# Patient Record
Sex: Female | Born: 1990 | Race: Black or African American | Hispanic: No | Marital: Single | Smoking: Current every day smoker
Health system: Southern US, Community
[De-identification: ages and names within clinical notes are randomized; demographics above are authoritative.]

## PROBLEM LIST (undated history)

## (undated) ENCOUNTER — Inpatient Hospital Stay (HOSPITAL_COMMUNITY): Payer: Self-pay

## (undated) DIAGNOSIS — O98519 Other viral diseases complicating pregnancy, unspecified trimester: Secondary | ICD-10-CM

## (undated) DIAGNOSIS — B009 Herpesviral infection, unspecified: Secondary | ICD-10-CM

## (undated) DIAGNOSIS — F329 Major depressive disorder, single episode, unspecified: Secondary | ICD-10-CM

## (undated) DIAGNOSIS — A749 Chlamydial infection, unspecified: Secondary | ICD-10-CM

## (undated) DIAGNOSIS — F32A Depression, unspecified: Secondary | ICD-10-CM

## (undated) DIAGNOSIS — F419 Anxiety disorder, unspecified: Secondary | ICD-10-CM

## (undated) HISTORY — DX: Other viral diseases complicating pregnancy, unspecified trimester: O98.519

## (undated) HISTORY — DX: Herpesviral infection, unspecified: B00.9

## (undated) HISTORY — PX: NO PAST SURGERIES: SHX2092

---
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
## 2009-03-05 ENCOUNTER — Inpatient Hospital Stay (HOSPITAL_COMMUNITY): Admission: AD | Admit: 2009-03-05 | Discharge: 2009-03-05 | Payer: Self-pay | Admitting: Obstetrics and Gynecology

## 2009-05-05 ENCOUNTER — Inpatient Hospital Stay (HOSPITAL_COMMUNITY): Admission: AD | Admit: 2009-05-05 | Discharge: 2009-05-06 | Payer: Self-pay | Admitting: Obstetrics and Gynecology

## 2009-05-14 ENCOUNTER — Encounter: Payer: Self-pay | Admitting: Obstetrics and Gynecology

## 2009-05-14 ENCOUNTER — Inpatient Hospital Stay (HOSPITAL_COMMUNITY): Admission: AD | Admit: 2009-05-14 | Discharge: 2009-05-28 | Payer: Self-pay | Admitting: Obstetrics and Gynecology

## 2009-05-27 ENCOUNTER — Encounter: Payer: Self-pay | Admitting: Obstetrics and Gynecology

## 2009-05-31 ENCOUNTER — Inpatient Hospital Stay (HOSPITAL_COMMUNITY): Admission: AD | Admit: 2009-05-31 | Discharge: 2009-06-12 | Payer: Self-pay | Admitting: Obstetrics and Gynecology

## 2009-06-03 ENCOUNTER — Encounter: Payer: Self-pay | Admitting: Obstetrics and Gynecology

## 2009-06-10 ENCOUNTER — Encounter (INDEPENDENT_AMBULATORY_CARE_PROVIDER_SITE_OTHER): Payer: Self-pay | Admitting: Obstetrics and Gynecology

## 2009-09-15 ENCOUNTER — Emergency Department (HOSPITAL_COMMUNITY): Admission: EM | Admit: 2009-09-15 | Discharge: 2009-09-15 | Payer: Self-pay | Admitting: Family Medicine

## 2009-11-02 ENCOUNTER — Emergency Department (HOSPITAL_COMMUNITY): Admission: EM | Admit: 2009-11-02 | Discharge: 2009-11-02 | Payer: Self-pay | Admitting: Emergency Medicine

## 2009-12-14 ENCOUNTER — Emergency Department (HOSPITAL_COMMUNITY): Admission: EM | Admit: 2009-12-14 | Discharge: 2009-12-14 | Payer: Self-pay | Admitting: Emergency Medicine

## 2009-12-25 ENCOUNTER — Inpatient Hospital Stay (HOSPITAL_COMMUNITY): Admission: AD | Admit: 2009-12-25 | Discharge: 2009-12-25 | Payer: Self-pay | Admitting: Obstetrics and Gynecology

## 2010-05-06 ENCOUNTER — Emergency Department (HOSPITAL_COMMUNITY): Admission: EM | Admit: 2010-05-06 | Discharge: 2010-05-06 | Payer: Self-pay | Admitting: Family Medicine

## 2010-05-26 ENCOUNTER — Emergency Department (HOSPITAL_COMMUNITY): Admission: EM | Admit: 2010-05-26 | Discharge: 2010-05-26 | Payer: Self-pay | Admitting: Family Medicine

## 2010-07-22 ENCOUNTER — Inpatient Hospital Stay (HOSPITAL_COMMUNITY): Admission: AD | Admit: 2010-07-22 | Discharge: 2010-07-22 | Payer: Self-pay | Admitting: Obstetrics and Gynecology

## 2010-07-22 ENCOUNTER — Ambulatory Visit: Payer: Self-pay | Admitting: Family

## 2010-07-30 ENCOUNTER — Inpatient Hospital Stay (HOSPITAL_COMMUNITY): Admission: AD | Admit: 2010-07-30 | Discharge: 2010-07-30 | Payer: Self-pay | Admitting: Obstetrics and Gynecology

## 2010-07-30 ENCOUNTER — Ambulatory Visit: Payer: Self-pay | Admitting: Obstetrics and Gynecology

## 2010-09-20 ENCOUNTER — Emergency Department (HOSPITAL_COMMUNITY): Admission: EM | Admit: 2010-09-20 | Discharge: 2010-09-20 | Payer: Self-pay | Admitting: Family Medicine

## 2010-12-26 ENCOUNTER — Encounter: Payer: Self-pay | Admitting: Obstetrics and Gynecology

## 2011-02-15 LAB — POCT URINALYSIS DIPSTICK
Bilirubin Urine: NEGATIVE
Ketones, ur: NEGATIVE mg/dL
Protein, ur: 30 mg/dL — AB
Specific Gravity, Urine: 1.02 (ref 1.005–1.030)
pH: 7 (ref 5.0–8.0)

## 2011-02-15 LAB — POCT PREGNANCY, URINE: Preg Test, Ur: NEGATIVE

## 2011-02-17 LAB — CBC
Hemoglobin: 11.4 g/dL — ABNORMAL LOW (ref 12.0–15.0)
MCH: 30.8 pg (ref 26.0–34.0)
MCHC: 34.2 g/dL (ref 30.0–36.0)
MCV: 90 fL (ref 78.0–100.0)
RBC: 3.72 MIL/uL — ABNORMAL LOW (ref 3.87–5.11)

## 2011-02-17 LAB — URINALYSIS, ROUTINE W REFLEX MICROSCOPIC
Bilirubin Urine: NEGATIVE
Bilirubin Urine: NEGATIVE
Glucose, UA: NEGATIVE mg/dL
Hgb urine dipstick: NEGATIVE
Ketones, ur: NEGATIVE mg/dL
Nitrite: NEGATIVE
Specific Gravity, Urine: 1.015 (ref 1.005–1.030)
Urobilinogen, UA: 0.2 mg/dL (ref 0.0–1.0)
pH: 6 (ref 5.0–8.0)
pH: 7 (ref 5.0–8.0)

## 2011-02-19 LAB — DIFFERENTIAL
Basophils Relative: 0 % (ref 0–1)
Lymphs Abs: 1.8 10*3/uL (ref 0.7–4.0)
Monocytes Absolute: 0.6 10*3/uL (ref 0.1–1.0)
Monocytes Relative: 10 % (ref 3–12)
Neutro Abs: 3.7 10*3/uL (ref 1.7–7.7)
Neutrophils Relative %: 60 % (ref 43–77)

## 2011-02-19 LAB — POCT URINALYSIS DIP (DEVICE)
Bilirubin Urine: NEGATIVE
Bilirubin Urine: NEGATIVE
Glucose, UA: NEGATIVE mg/dL
Glucose, UA: NEGATIVE mg/dL
Hgb urine dipstick: NEGATIVE
Hgb urine dipstick: NEGATIVE
Ketones, ur: NEGATIVE mg/dL
Nitrite: NEGATIVE
Specific Gravity, Urine: 1.015 (ref 1.005–1.030)
Specific Gravity, Urine: 1.015 (ref 1.005–1.030)
pH: 6 (ref 5.0–8.0)

## 2011-02-19 LAB — URINE CULTURE
Colony Count: 60000
Colony Count: NO GROWTH
Culture: NO GROWTH

## 2011-02-19 LAB — CBC
Hemoglobin: 10.9 g/dL — ABNORMAL LOW (ref 12.0–15.0)
MCHC: 34.1 g/dL (ref 30.0–36.0)
RBC: 3.6 MIL/uL — ABNORMAL LOW (ref 3.87–5.11)
WBC: 6.1 10*3/uL (ref 4.0–10.5)

## 2011-02-19 LAB — GC/CHLAMYDIA PROBE AMP, GENITAL
GC Probe Amp, Genital: NEGATIVE
GC Probe Amp, Genital: POSITIVE — AB

## 2011-02-19 LAB — POCT PREGNANCY, URINE: Preg Test, Ur: POSITIVE

## 2011-02-19 LAB — WET PREP, GENITAL
Trich, Wet Prep: NONE SEEN
Yeast Wet Prep HPF POC: NONE SEEN

## 2011-02-20 LAB — POCT URINALYSIS DIP (DEVICE)
Bilirubin Urine: NEGATIVE
Glucose, UA: NEGATIVE mg/dL
Hgb urine dipstick: NEGATIVE
Nitrite: NEGATIVE
Specific Gravity, Urine: >=1.03 (ref 1.005–1.030)
pH: 6 (ref 5.0–8.0)

## 2011-02-20 LAB — WET PREP, GENITAL: Yeast Wet Prep HPF POC: NONE SEEN

## 2011-02-20 LAB — GC/CHLAMYDIA PROBE AMP, GENITAL
Chlamydia, DNA Probe: POSITIVE — AB
GC Probe Amp, Genital: POSITIVE — AB

## 2011-02-24 ENCOUNTER — Inpatient Hospital Stay (INDEPENDENT_AMBULATORY_CARE_PROVIDER_SITE_OTHER)
Admission: RE | Admit: 2011-02-24 | Discharge: 2011-02-24 | Disposition: A | Discharge status: Home / Self Care | Payer: Medicaid Other | Source: Ambulatory Visit | Attending: Family Medicine | Admitting: Family Medicine

## 2011-02-24 DIAGNOSIS — N912 Amenorrhea, unspecified: Secondary | ICD-10-CM

## 2011-02-24 DIAGNOSIS — N760 Acute vaginitis: Secondary | ICD-10-CM

## 2011-02-24 LAB — POCT URINALYSIS DIP (DEVICE)
Ketones, ur: NEGATIVE mg/dL
Protein, ur: 30 mg/dL — AB
Urobilinogen, UA: 1 mg/dL (ref 0.0–1.0)
pH: 6 (ref 5.0–8.0)

## 2011-02-24 LAB — WET PREP, GENITAL

## 2011-02-26 LAB — URINE CULTURE
Colony Count: >=100000
Culture  Setup Time: 201203231055

## 2011-02-27 LAB — GC/CHLAMYDIA PROBE AMP, GENITAL: GC Probe Amp, Genital: NEGATIVE

## 2011-03-08 LAB — URINALYSIS, ROUTINE W REFLEX MICROSCOPIC
Glucose, UA: NEGATIVE mg/dL
Leukocytes, UA: NEGATIVE
Specific Gravity, Urine: 1.016 (ref 1.005–1.030)
Urobilinogen, UA: 0.2 mg/dL (ref 0.0–1.0)

## 2011-03-08 LAB — GC/CHLAMYDIA PROBE AMP, GENITAL: GC Probe Amp, Genital: NEGATIVE

## 2011-03-08 LAB — URINE MICROSCOPIC-ADD ON

## 2011-03-08 LAB — WET PREP, GENITAL

## 2011-03-09 LAB — POCT URINALYSIS DIP (DEVICE)
Bilirubin Urine: NEGATIVE
Glucose, UA: NEGATIVE mg/dL
Ketones, ur: NEGATIVE mg/dL
pH: 7 (ref 5.0–8.0)

## 2011-03-09 LAB — GC/CHLAMYDIA PROBE AMP, GENITAL
Chlamydia, DNA Probe: NEGATIVE
GC Probe Amp, Genital: NEGATIVE

## 2011-03-09 LAB — POCT PREGNANCY, URINE: Preg Test, Ur: NEGATIVE

## 2011-03-09 LAB — WET PREP, GENITAL: Yeast Wet Prep HPF POC: NONE SEEN

## 2011-03-12 LAB — CBC
HCT: 30.6 % — ABNORMAL LOW (ref 36.0–46.0)
Hemoglobin: 10.9 g/dL — ABNORMAL LOW (ref 12.0–15.0)
MCHC: 35.5 g/dL (ref 30.0–36.0)
MCHC: 35.6 g/dL (ref 30.0–36.0)
MCV: 94 fL (ref 78.0–100.0)
MCV: 94.2 fL (ref 78.0–100.0)
Platelets: 118 10*3/uL — ABNORMAL LOW (ref 150–400)
Platelets: 118 10*3/uL — ABNORMAL LOW (ref 150–400)
RDW: 12.9 % (ref 11.5–15.5)
RDW: 13.3 % (ref 11.5–15.5)

## 2011-03-13 LAB — WET PREP, GENITAL

## 2011-03-13 LAB — STREP B DNA PROBE: Strep Group B Ag: POSITIVE

## 2011-03-13 LAB — CBC
HCT: 29.6 % — ABNORMAL LOW (ref 36.0–46.0)
Hemoglobin: 11 g/dL — ABNORMAL LOW (ref 12.0–15.0)
MCHC: 34.8 g/dL (ref 30.0–36.0)
MCV: 95.6 fL (ref 78.0–100.0)
Platelets: 140 10*3/uL — ABNORMAL LOW (ref 150–400)
RBC: 3.3 MIL/uL — ABNORMAL LOW (ref 3.87–5.11)
RDW: 13.7 % (ref 11.5–15.5)
WBC: 7.8 10*3/uL (ref 4.0–10.5)
WBC: 7.5 10*3/uL (ref 4.0–10.5)

## 2011-03-13 LAB — URINALYSIS, MICROSCOPIC ONLY
Ketones, ur: NEGATIVE mg/dL
Leukocytes, UA: NEGATIVE
Protein, ur: NEGATIVE mg/dL
Urobilinogen, UA: 0.2 mg/dL (ref 0.0–1.0)

## 2011-03-13 LAB — URINE CULTURE
Colony Count: >=100000
Special Requests: NEGATIVE

## 2011-03-13 LAB — RPR: RPR Ser Ql: NONREACTIVE

## 2011-03-15 LAB — URINALYSIS, ROUTINE W REFLEX MICROSCOPIC
Glucose, UA: NEGATIVE mg/dL
Nitrite: NEGATIVE
Specific Gravity, Urine: >1.03 — ABNORMAL HIGH (ref 1.005–1.030)
pH: 6 (ref 5.0–8.0)

## 2011-03-20 LAB — COMPREHENSIVE METABOLIC PANEL
ALT: 19 U/L (ref 0–35)
Alkaline Phosphatase: 45 U/L (ref 39–117)
BUN: 8 mg/dL (ref 6–23)
CO2: 25 mEq/L (ref 19–32)
Chloride: 107 mEq/L (ref 96–112)
Glucose, Bld: 93 mg/dL (ref 70–99)
Potassium: 4 mEq/L (ref 3.5–5.1)
Sodium: 136 mEq/L (ref 135–145)
Total Bilirubin: 0.6 mg/dL (ref 0.3–1.2)
Total Protein: 6.6 g/dL (ref 6.0–8.3)

## 2011-03-20 LAB — CBC
HCT: 33.8 % — ABNORMAL LOW (ref 36.0–46.0)
Hemoglobin: 11.4 g/dL — ABNORMAL LOW (ref 12.0–15.0)
RBC: 3.73 MIL/uL — ABNORMAL LOW (ref 3.87–5.11)
RDW: 14.6 % (ref 11.5–15.5)
WBC: 9.8 10*3/uL (ref 4.0–10.5)

## 2011-03-20 LAB — DIFFERENTIAL
Basophils Relative: 0 % (ref 0–1)
Lymphocytes Relative: 6 % — ABNORMAL LOW (ref 12–46)
Lymphs Abs: 0.6 10*3/uL — ABNORMAL LOW (ref 0.7–4.0)
Monocytes Relative: 3 % (ref 3–12)
Neutro Abs: 8.9 10*3/uL — ABNORMAL HIGH (ref 1.7–7.7)
Neutrophils Relative %: 91 % — ABNORMAL HIGH (ref 43–77)

## 2011-03-20 LAB — URINALYSIS, ROUTINE W REFLEX MICROSCOPIC
Hgb urine dipstick: NEGATIVE
Nitrite: NEGATIVE
Specific Gravity, Urine: 1.02 (ref 1.005–1.030)
Urobilinogen, UA: 0.2 mg/dL (ref 0.0–1.0)
pH: 7.5 (ref 5.0–8.0)

## 2011-03-20 LAB — GC/CHLAMYDIA PROBE AMP, GENITAL
Chlamydia, DNA Probe: NEGATIVE
GC Probe Amp, Genital: NEGATIVE

## 2011-03-20 LAB — WET PREP, GENITAL

## 2011-04-17 ENCOUNTER — Inpatient Hospital Stay (HOSPITAL_COMMUNITY)
Admission: AD | Admit: 2011-04-17 | Discharge: 2011-04-17 | Disposition: A | Discharge status: Home / Self Care | Payer: Medicaid Other | Source: Ambulatory Visit | Attending: Obstetrics and Gynecology | Admitting: Obstetrics and Gynecology

## 2011-04-17 DIAGNOSIS — N898 Other specified noninflammatory disorders of vagina: Secondary | ICD-10-CM | POA: Insufficient documentation

## 2011-04-17 LAB — WET PREP, GENITAL
Clue Cells Wet Prep HPF POC: NONE SEEN
Trich, Wet Prep: NONE SEEN

## 2011-04-18 LAB — GC/CHLAMYDIA PROBE AMP, GENITAL
Chlamydia, DNA Probe: NEGATIVE
GC Probe Amp, Genital: NEGATIVE

## 2011-04-18 NOTE — Discharge Summary (Signed)
Jaime Mendoza, Jaime Mendoza               ACCOUNT NO.:  [id number]   MEDICAL RECORD NO.:  [id number]          PATIENT TYPE:  INP   LOCATION:  9302                          FACILITY:  [facility]   PHYSICIAN:  Sherron Monday, MD        DATE OF BIRTH:  06/26/1991   DATE OF ADMISSION:  05/31/2009  DATE OF DISCHARGE:  06/12/2009                               DISCHARGE SUMMARY   ADMITTING DIAGNOSIS:  Intrauterine pregnancy at 32 plus weeks with  preterm premature rupture of membranes.   DISCHARGE DIAGNOSIS:  Intrauterine pregnancy at 32 plus weeks with  preterm premature rupture of membranes, delivered via spontaneous  vaginal delivery.   HISTORY OF PRESENT ILLNESS:  A 20 year old G2, P1-0-0-1 at 36 plus weeks  with PPROM.  The patient was previously admitted with PPROM at  approximately 30 weeks, but at this time she had stopped leaking, and  amnioinfusion with dye on May 27, 2009, revealed no leakage.  The day  of admission, she had a gush of clear fluid at 10:30 a.m. with confirmed  PPROM in our office.  She states she has had somewhat decreased fetal  movement and some vaginal bleeding over the weekend that she describes  only as spotting.  She has occasional contractions.  Pregnancy has been  complicated by BV, yeast, herpes outbreak, and PPROM that she was  admitted on May 14, 2009.  She received ampicillin, Zithromax, and  betamethasone.   PAST MEDICAL HISTORY:  Significant for migraines and asthma.   PAST SURGICAL HISTORY:  Not significant.   PAST OB/GYN HISTORY:  G1 was a term vaginal delivery with female infant.  G2, present pregnancy, female infant.  No abnormal Pap smears or  sexually transmitted disease.   MEDICATIONS:  Prenatal vitamins.   ALLERGIES:  No known drug allergies.   SOCIAL HISTORY:  Denies alcohol, tobacco, or drug use.  She is single.   FAMILY HISTORY:  Significant for coronary artery disease in paternal  grandfather, maternal grandfather, hypertension is widespread  in the  family, sister with a neurologic disease, and grandmother with diabetes.   HOSPITAL COURSE:  She is A positive, antibody screen negative, sickle  cell within normal limits, RPR nonreactive, rubella immune, hepatitis B  surface antigen negative, HIV negative, gonorrhea negative, and  chlamydia negative.  Glucola of 81.  She had an ultrasound at 11 weeks  and 5 days to date the pregnancy.  She had anatomy scan at 19 weeks plus  3 days with limited heart and face anatomy, otherwise normal.  Scan  revealed normal anatomy.  In May, she had an ultrasound with good  growth, cervical length of 3.6, and normal AFI.  On admission, she had a  benign exam.  She was afebrile.  Vital signs stable.  Fetal heart tones  were in the 140s and reactive with occasional contractions.  Visually,  she was closed by speculum exam in the office.  She was admitted, had  previously received betamethasone, ampicillin, and Zithromax.  NICU was  consulted.  She received daily NSTs and plans for delivery at 34 weeks.  She also received magnesium for CP prophylaxis for 12 hours after  admission.  Her course was relatively uncomplicated.  While she was  admitted, baby remained reactive.  She continued to leak clear fluid.  Her labor was induced on May 11, 2009, this was when she was 34 weeks.  At the time of transfer cervix was fingertip, long, and -3 station.  AROM was performed.  She proceeded in her labor with complete +2, pushed  well with delivery of a viable female infant with weight of 4 pounds 7  ounces, Apgars of 9 at 1 minute and 9 at 5 minutes.  NICU was present  for her delivery.  Her postpartum course was relatively uncomplicated.  She remained afebrile with vital signs stable and was discharged home on  postpartum day #2.  At this time she was able to tolerate a diet, had  normal lochia, and her pain was well controlled.  She was afebrile.  Vital signs stable.  Her hemoglobin had decreased from 10.9  to 10.2.  She was discharged home with prescriptions for Motrin and Vicodin.  She  will continue to take prenatal vitamins for approximately a month.  She  plans to bottle feed.  She will follow up in the office in approximately  6 weeks.      Sherron Monday, MD  Electronically Signed     JB/MEDQ  D:  06/12/2009  T:  06/12/2009  Job:  [id number]

## 2011-04-18 NOTE — Discharge Summary (Signed)
NAMEBHAVYA, Jaime Mendoza               ACCOUNT NO.:  [id number]   MEDICAL RECORD NO.:  [id number]          PATIENT TYPE:  INP   LOCATION:  9149                          [facility]ILITY:  [facility]   PHYSICIAN:  Sherron Monday, MD        DATE OF BIRTH:  09/23/1991   DATE OF ADMISSION:  05/14/2009  DATE OF DISCHARGE:  05/28/2009                               DISCHARGE SUMMARY   ADMITTING DIAGNOSIS:  Preterm premature rupture of membranes,  intrauterine pregnancy at 30 weeks.   DISCHARGE DIAGNOSES:  Cessation of leakage of fluid, question reseal  confirmed by dye amnioinfusion.   PROCEDURES:  Daily NST, ultrasound, and amnioinfusion.   For complete history of physical, please see the dictated note.  However, in brief, a 20 year old G2, P1-0-0-1 at 30 weeks, admitted with  PPROM after ferning was noted in the office.  Her hospital course is  relatively uncomplicated.  She stopped leaking fluid and only was having  mucousy discharge after approximately a week of admission.  Prior to  this, she had received her Zithromax for latency as well as  betamethasone for fetal lung maturity.  She had a NICU consult.  On  hospital day #13, she again discussed the cessation of leaking fluid and  questioned if she in fact was ruptured.  Dye amnioinfusion was  performed.  No leakage was found after this.  She was discharged to home  on hospital day #14.   Routine discharge instructions and numbers to call if any questions or  problems.  She will follow up in the office for biweekly NSTs and  ultrasound in approximately a week.  She will decrease her activity as  well.  These appointments were made for her.  Prior to discharge, she  had an ultrasound that showed subjective decreased AFI.  She will have  an ultrasound again in the office that was scheduled on Thursday, July  1, for growth and evaluation of placenta and possible pericardial  effusion.  The patient voices understanding of all this and was  discharged to  home.  She was given numbers to call if any questions or  problems. Throughout her hospitalization, her baby remained  approximately in the 140s and reactive with only occasional  contractions.      Sherron Monday, MD  Electronically Signed     JB/MEDQ  D:  05/28/2009  T:  05/28/2009  Job:  [id number]

## 2011-04-18 NOTE — H&P (Signed)
Jaime Mendoza, Jaime Mendoza               ACCOUNT NO.:  [id number]   MEDICAL RECORD NO.:  [id number]          PATIENT TYPE:  OUT   LOCATION:  MFM                           [facility]   PHYSICIAN:  Malachi Pro. Ambrose Mantle, M.D. DATE OF BIRTH:  Feb 14, 1991   DATE OF ADMISSION:  05/14/2009  DATE OF DISCHARGE:                              HISTORY & PHYSICAL   PRESENT ILLNESS:  This is a 20 year old African American single female,  para 1-0-0-1, gravida 2, EDC July 22, 2009 who was admitted with  preterm premature rupture of membranes.  Blood group and type A+,  negative antibody, sickle cell negative, RPR nonreactive, rubella  immune, hepatitis B surface antigen negative, HIV negative, GC and  chlamydia negative, 1-hour Glucola 81.  Group B strep not done.  Vaginal  ultrasound on January 05, 2009, crown-rump length 4.86 cm, 11 weeks 5  days, [facility] July 22, 2009.  The patient was treated with metronidazole  and Terazol for combination bacterial vaginosis and yeast infection on  January 05, 2009.  The patient complained of an outbreak of herpes on  January 20, 2009, and was treated with Valtrex.  She requested  metronidazole again on February 18, 2009.  Ultrasound on March 01, 2009,  showed an average gestational age of [redacted] weeks 3 days, [facility] July 23, 2009.  The anatomy was normal, but there was a mild limitation of the  heart and face.  Repeat ultrasound on March 30, 2009, showed normal  facial anatomy and normal heart anatomy.  On Apr 15, 2009, her  hemoglobin was 9.7.  She was advised ferrous sulfate 325 mg once a day.  On Apr 22, 2009, the patient was seen for a bloody discharge in the  vagina.  Fetal fibronectin was negative.  Affirm was positive for  bacterial vaginosis and she was given clindamycin.  Ultrasound on Apr 22, 2009, showed a cervical length of 3.62 cm, AFI 14.57 cm, normal  fetal anatomy, a lucent strip was seen within the cervical canal that  might have represented a lucent  mucus plug.  Fetal weight was estimated  at the 65th percentile and amniotic fluid volume at the 50th percentile.  There was a nuchal cord x1.  On May 06, 2009, the patient was seen at  [facility] complaining of constantly urinating, thinking that she  had ruptured her membranes.  Tests there did not confirm rupture of  membranes.  The patient was seen again on May 14, 2009, in our office  and complained that quite a bit of fluid was running out of her vagina.  She also had some dysuria.  Urinalysis showed urine to be loaded with  white cells.  Speculum exam showed no fluid in the vagina, but the fern  test was markedly positive for ferning.  The cervix felt closed.   PAST MEDICAL HISTORY:   ALLERGIES:  NO KNOWN ALLERGIES.   She had a history of migraines, also has a history of asthma and has  been on albuterol inhalers.  She has had one outbreak of herpes.  Alcohol, tobacco and drugs  none.   FAMILY HISTORY:  Paternal grandfather and maternal grandfather with  heart disease.  Multiple family members with high blood pressure.  Sister with some type of neurologic problem.  Paternal grandmother has  diabetes.   PHYSICAL EXAMINATION:  GENERAL:  On admission, revealed a well-  developed, well-nourished black female in no distress.  VITAL SIGNS:  Blood pressure 100/70, pulse 80, weight 130 pounds.  HEAD, EYES, EARS, NOSE AND THROAT:  Show braces.  LUNGS:  Clear to auscultation.  HEART:  Normal size and sounds.  ABDOMEN:  Soft.  Fundal height is 31-1/2 cm.  Fetal heart tones are  normal.  The cervix feels closed, but it is difficult to examine.  There  is about a 3 cm urethral diverticulum or suburethral cyst in the  anterior vagina.   ADMITTING IMPRESSION:  Intrauterine pregnancy at 30 weeks and 1 day with  preterm premature rupture of membranes.  The patient is admitted for  consultation with maternal fetal medicine.  She will be given steroids.  She is given azithromycin and  ampicillin for 7 days.  She will do a  daily nonstress test.  Ultrasound will be done for growth and fluid  volume.      Malachi Pro. Ambrose Mantle, M.D.  Electronically Signed     TFH/MEDQ  D:  05/14/2009  T:  05/14/2009  Job:  [id number]

## 2011-06-02 ENCOUNTER — Inpatient Hospital Stay (HOSPITAL_COMMUNITY)
Admission: AD | Admit: 2011-06-02 | Discharge: 2011-06-02 | Disposition: A | Discharge status: Home / Self Care | Payer: Medicaid Other | Source: Ambulatory Visit | Attending: Obstetrics and Gynecology | Admitting: Obstetrics and Gynecology

## 2011-06-02 DIAGNOSIS — O210 Mild hyperemesis gravidarum: Secondary | ICD-10-CM

## 2011-06-02 DIAGNOSIS — O9989 Other specified diseases and conditions complicating pregnancy, childbirth and the puerperium: Secondary | ICD-10-CM

## 2011-06-02 DIAGNOSIS — O99891 Other specified diseases and conditions complicating pregnancy: Secondary | ICD-10-CM | POA: Insufficient documentation

## 2011-06-02 DIAGNOSIS — K5289 Other specified noninfective gastroenteritis and colitis: Secondary | ICD-10-CM | POA: Insufficient documentation

## 2011-06-02 LAB — DIFFERENTIAL
Lymphocytes Relative: 12 % (ref 12–46)
Lymphs Abs: 1 10*3/uL (ref 0.7–4.0)
Neutrophils Relative %: 81 % — ABNORMAL HIGH (ref 43–77)

## 2011-06-02 LAB — URINALYSIS, ROUTINE W REFLEX MICROSCOPIC
Bilirubin Urine: NEGATIVE
Hgb urine dipstick: NEGATIVE
Protein, ur: 30 mg/dL — AB
Urobilinogen, UA: 0.2 mg/dL (ref 0.0–1.0)

## 2011-06-02 LAB — CBC
HCT: 37.6 % (ref 36.0–46.0)
MCV: 86.2 fL (ref 78.0–100.0)
Platelets: 135 10*3/uL — ABNORMAL LOW (ref 150–400)
RBC: 4.36 MIL/uL (ref 3.87–5.11)
WBC: 7.8 10*3/uL (ref 4.0–10.5)

## 2011-06-02 LAB — URINE MICROSCOPIC-ADD ON

## 2011-06-07 ENCOUNTER — Inpatient Hospital Stay (HOSPITAL_COMMUNITY)
Admission: AD | Admit: 2011-06-07 | Discharge: 2011-06-07 | Disposition: A | Discharge status: Home / Self Care | Payer: Medicaid Other | Source: Ambulatory Visit | Attending: Obstetrics & Gynecology | Admitting: Obstetrics & Gynecology

## 2011-06-07 ENCOUNTER — Inpatient Hospital Stay (HOSPITAL_COMMUNITY): Payer: Medicaid Other

## 2011-06-07 DIAGNOSIS — O9989 Other specified diseases and conditions complicating pregnancy, childbirth and the puerperium: Secondary | ICD-10-CM

## 2011-06-07 DIAGNOSIS — N926 Irregular menstruation, unspecified: Secondary | ICD-10-CM

## 2011-06-07 DIAGNOSIS — R1032 Left lower quadrant pain: Secondary | ICD-10-CM | POA: Insufficient documentation

## 2011-06-07 DIAGNOSIS — O99891 Other specified diseases and conditions complicating pregnancy: Secondary | ICD-10-CM | POA: Insufficient documentation

## 2011-06-07 LAB — URINALYSIS, ROUTINE W REFLEX MICROSCOPIC
Glucose, UA: NEGATIVE mg/dL
Ketones, ur: NEGATIVE mg/dL
Leukocytes, UA: NEGATIVE
Specific Gravity, Urine: 1.02 (ref 1.005–1.030)
pH: 7 (ref 5.0–8.0)

## 2011-06-07 LAB — CBC
HCT: 34.4 % — ABNORMAL LOW (ref 36.0–46.0)
Hemoglobin: 11.7 g/dL — ABNORMAL LOW (ref 12.0–15.0)
MCH: 29.8 pg (ref 26.0–34.0)
MCHC: 34 g/dL (ref 30.0–36.0)

## 2011-06-16 ENCOUNTER — Encounter (HOSPITAL_COMMUNITY): Payer: Self-pay | Admitting: *Deleted

## 2011-06-16 ENCOUNTER — Other Ambulatory Visit: Payer: Self-pay | Admitting: Obstetrics & Gynecology

## 2011-06-16 ENCOUNTER — Other Ambulatory Visit (HOSPITAL_COMMUNITY): Payer: Self-pay | Admitting: Obstetrics & Gynecology

## 2011-06-16 ENCOUNTER — Encounter (HOSPITAL_COMMUNITY): Payer: Self-pay

## 2011-06-16 ENCOUNTER — Other Ambulatory Visit (HOSPITAL_COMMUNITY): Payer: Medicaid Other

## 2011-06-16 ENCOUNTER — Ambulatory Visit (HOSPITAL_COMMUNITY)
Admit: 2011-06-16 | Discharge: 2011-06-16 | Disposition: A | Discharge status: Home / Self Care | Payer: Medicaid Other | Attending: Obstetrics and Gynecology | Admitting: Obstetrics and Gynecology

## 2011-06-16 ENCOUNTER — Inpatient Hospital Stay (HOSPITAL_COMMUNITY)
Admission: AD | Admit: 2011-06-16 | Discharge: 2011-06-16 | Disposition: A | Discharge status: Home / Self Care | Payer: Medicaid Other | Source: Ambulatory Visit | Attending: Obstetrics and Gynecology | Admitting: Obstetrics and Gynecology

## 2011-06-16 DIAGNOSIS — Z363 Encounter for antenatal screening for malformations: Secondary | ICD-10-CM | POA: Insufficient documentation

## 2011-06-16 DIAGNOSIS — R52 Pain, unspecified: Secondary | ICD-10-CM

## 2011-06-16 DIAGNOSIS — Z1389 Encounter for screening for other disorder: Secondary | ICD-10-CM | POA: Insufficient documentation

## 2011-06-16 DIAGNOSIS — Z8751 Personal history of pre-term labor: Secondary | ICD-10-CM | POA: Insufficient documentation

## 2011-06-16 DIAGNOSIS — Z362 Encounter for other antenatal screening follow-up: Secondary | ICD-10-CM

## 2011-06-16 DIAGNOSIS — Z3689 Encounter for other specified antenatal screening: Secondary | ICD-10-CM | POA: Insufficient documentation

## 2011-06-16 DIAGNOSIS — O99891 Other specified diseases and conditions complicating pregnancy: Secondary | ICD-10-CM | POA: Insufficient documentation

## 2011-06-16 HISTORY — DX: Chlamydial infection, unspecified: A74.9

## 2011-06-16 MED ORDER — PROMETHAZINE HCL 25 MG RE SUPP: 25.0000 mg | Freq: Four times a day (QID) | RECTAL | Status: AC | PRN

## 2011-06-16 MED ORDER — PROMETHAZINE HCL 25 MG PO TABS: 12.5000 mg | ORAL_TABLET | Freq: Four times a day (QID) | ORAL | Status: AC | PRN

## 2011-06-16 NOTE — ED Provider Notes (Signed)
History     Chief Complaint  Patient presents with  . Follow-up   HPI  OB History    Grav Para Term Preterm Abortions TAB SAB Ect Mult Living   4 2 1 1 1  0 1 0 0 2      Past Medical History  Diagnosis Date  . Urinary tract infection   . Chlamydia   . Preterm labor   . No pertinent past medical history     Past Surgical History  Procedure Date  . No past surgeries     No family history on file.  History  Substance Use Topics  . Smoking status: Current Everyday Smoker -- 5 years    Types: Cigarettes  . Smokeless tobacco: Never Used  . Alcohol Use: No    Allergies: No Known Allergies  No prescriptions prior to admission    ROS Physical Exam   Blood pressure 98/56, pulse 81, temperature 98.6 F (37 C), temperature source Oral, resp. rate 20, last menstrual period 04/28/2011.  Physical Exam  MAU Course  Procedures Ultrasound today shows a viable 7 wk. IUP. Will d/c home to start [facility] with Dr. Ellyn Hack. Pregnancy verification letter.   [place], [place] 06/16/11 [phone]

## 2011-06-16 NOTE — Progress Notes (Signed)
Rollover from [place]- viable IUP

## 2011-07-25 LAB — HIV ANTIBODY (ROUTINE TESTING W REFLEX): HIV: NONREACTIVE

## 2011-07-25 LAB — ABO/RH: RH Type: POSITIVE

## 2011-07-25 LAB — GC/CHLAMYDIA PROBE AMP, GENITAL: Gonorrhea: NEGATIVE

## 2011-09-04 LAB — CBC
HCT: 36.5
Hemoglobin: 12.2
Platelets: 151
RDW: 12.9
WBC: 6.6

## 2011-09-04 LAB — GC/CHLAMYDIA PROBE AMP, GENITAL: Chlamydia, DNA Probe: NEGATIVE

## 2011-09-04 LAB — DIFFERENTIAL
Basophils Absolute: 0
Eosinophils Relative: 1
Lymphocytes Relative: 34
Lymphs Abs: 2.3
Neutro Abs: 3.9

## 2011-09-04 LAB — WET PREP, GENITAL: Yeast Wet Prep HPF POC: NONE SEEN

## 2011-09-04 LAB — URINE MICROSCOPIC-ADD ON

## 2011-09-04 LAB — PREGNANCY, URINE: Preg Test, Ur: NEGATIVE

## 2011-09-04 LAB — URINALYSIS, ROUTINE W REFLEX MICROSCOPIC
Bilirubin Urine: NEGATIVE
Ketones, ur: NEGATIVE
Nitrite: NEGATIVE
Protein, ur: 30 — AB
Urobilinogen, UA: 1

## 2011-09-05 LAB — WET PREP, GENITAL
Trich, Wet Prep: NONE SEEN
Yeast Wet Prep HPF POC: NONE SEEN

## 2011-09-05 LAB — URINALYSIS, ROUTINE W REFLEX MICROSCOPIC
Glucose, UA: NEGATIVE
Nitrite: NEGATIVE
Specific Gravity, Urine: 1.015
pH: 7.5

## 2011-09-05 LAB — POCT PREGNANCY, URINE: Preg Test, Ur: NEGATIVE

## 2011-09-05 LAB — GLUCOSE, CAPILLARY: Glucose-Capillary: 85

## 2011-09-08 LAB — COMPREHENSIVE METABOLIC PANEL
ALT: 12 U/L (ref 0–35)
AST: 19 U/L (ref 0–37)
CO2: 26 mEq/L (ref 19–32)
Chloride: 102 mEq/L (ref 96–112)
Creatinine, Ser: 0.58 mg/dL (ref 0.4–1.2)
GFR calc Af Amer: >60 mL/min (ref >60)
GFR calc non Af Amer: >60 mL/min (ref >60)
Glucose, Bld: 99 mg/dL (ref 70–99)
Sodium: 135 mEq/L (ref 135–145)
Total Bilirubin: 0.6 mg/dL (ref 0.3–1.2)

## 2011-09-08 LAB — WET PREP, GENITAL
Trich, Wet Prep: NONE SEEN
Yeast Wet Prep HPF POC: NONE SEEN

## 2011-09-08 LAB — POCT URINALYSIS DIP (DEVICE)
Bilirubin Urine: NEGATIVE
Glucose, UA: NEGATIVE mg/dL
Hgb urine dipstick: NEGATIVE
Specific Gravity, Urine: 1.02 (ref 1.005–1.030)
Urobilinogen, UA: 2 mg/dL — ABNORMAL HIGH (ref 0.0–1.0)

## 2011-09-08 LAB — URINALYSIS, ROUTINE W REFLEX MICROSCOPIC
Bilirubin Urine: NEGATIVE
Hgb urine dipstick: NEGATIVE
Ketones, ur: 15 mg/dL — AB
Specific Gravity, Urine: 1.025 (ref 1.005–1.030)
Urobilinogen, UA: 0.2 mg/dL (ref 0.0–1.0)

## 2011-09-08 LAB — CBC
Hemoglobin: 11.8 g/dL — ABNORMAL LOW (ref 12.0–15.0)
MCV: 89.8 fL (ref 78.0–100.0)
RBC: 3.88 MIL/uL (ref 3.87–5.11)
WBC: 6.6 10*3/uL (ref 4.0–10.5)

## 2011-09-08 LAB — POCT PREGNANCY, URINE: Preg Test, Ur: POSITIVE

## 2011-09-08 LAB — GC/CHLAMYDIA PROBE AMP, GENITAL: GC Probe Amp, Genital: NEGATIVE

## 2011-09-08 LAB — URINE MICROSCOPIC-ADD ON

## 2011-09-11 LAB — CBC
HCT: 34.4 — ABNORMAL LOW
Hemoglobin: 11.7 — ABNORMAL LOW
MCV: 89.6
RBC: 3.84
WBC: 4.9

## 2011-09-12 LAB — CBC
HCT: 31.2 — ABNORMAL LOW
MCHC: 34.8
MCHC: 34.8
MCV: 91.5
MCV: 92.1
Platelets: 137 — ABNORMAL LOW
Platelets: 142 — ABNORMAL LOW
RBC: 2.93 — ABNORMAL LOW
RDW: 13.5
WBC: 8.8

## 2011-09-14 ENCOUNTER — Inpatient Hospital Stay (HOSPITAL_COMMUNITY)
Admission: AD | Admit: 2011-09-14 | Discharge: 2011-09-14 | Disposition: A | Discharge status: Home / Self Care | Payer: Medicaid Other | Source: Ambulatory Visit | Attending: Obstetrics and Gynecology | Admitting: Obstetrics and Gynecology

## 2011-09-14 ENCOUNTER — Encounter (HOSPITAL_COMMUNITY): Payer: Self-pay | Admitting: *Deleted

## 2011-09-14 DIAGNOSIS — N390 Urinary tract infection, site not specified: Secondary | ICD-10-CM

## 2011-09-14 DIAGNOSIS — O2340 Unspecified infection of urinary tract in pregnancy, unspecified trimester: Secondary | ICD-10-CM

## 2011-09-14 DIAGNOSIS — O2390 Unspecified genitourinary tract infection in pregnancy, unspecified trimester: Secondary | ICD-10-CM | POA: Insufficient documentation

## 2011-09-14 DIAGNOSIS — R109 Unspecified abdominal pain: Secondary | ICD-10-CM | POA: Insufficient documentation

## 2011-09-14 LAB — URINALYSIS, ROUTINE W REFLEX MICROSCOPIC
Glucose, UA: NEGATIVE mg/dL
Ketones, ur: NEGATIVE mg/dL
Nitrite: POSITIVE — AB
Protein, ur: 30 mg/dL — AB

## 2011-09-14 LAB — CBC
HCT: 28 % — ABNORMAL LOW (ref 36.0–46.0)
Hemoglobin: 9.7 g/dL — ABNORMAL LOW (ref 12.0–15.0)
MCHC: 34.6 g/dL (ref 30.0–36.0)
RBC: 3.14 MIL/uL — ABNORMAL LOW (ref 3.87–5.11)
WBC: 10.4 10*3/uL (ref 4.0–10.5)

## 2011-09-14 LAB — URINE MICROSCOPIC-ADD ON

## 2011-09-14 LAB — WET PREP, GENITAL: Clue Cells Wet Prep HPF POC: NONE SEEN

## 2011-09-14 MED ORDER — SULFAMETHOXAZOLE-TRIMETHOPRIM 800-160 MG PO TABS: 1.0000 | ORAL_TABLET | Freq: Two times a day (BID) | ORAL | Status: AC

## 2011-09-14 MED ORDER — ACETAMINOPHEN 500 MG PO TABS: 1000.0000 mg | ORAL_TABLET | Freq: Once | ORAL | Status: DC

## 2011-09-14 NOTE — Progress Notes (Signed)
PT C/O LOWER ABDOMINAL AND BACK PAIN SINCE THIS AM. PT STATES AROUND 0800AM HAD A GUSH OF CLEAR LIQUID FLUID. NO PAD ON AT THIS TIME. LARGE BLUE PAD PLACED.

## 2011-09-14 NOTE — ED Provider Notes (Signed)
Jaime D Shaw20 [id number] @[redacted]w[redacted]d  Chief Complaint  Patient presents with  . Abdominal Pain  . Back Pain    SUBJECTIVE  HPI: Presents via EMS. Woke at 0600 with constant severe mid- lower right back pain. Tried Tylenol without relief. Also having mild lower abdominal discomfort. Denies dysuria, hematuria, N/V, F/C  but has had recent onset urgency and long-standing urinary frequency. Denies irritative vaginitis. At about  0800 today she experienced some fluid trickling down her leg while she was on her way to void. This has not recurred and she is not needed a peripad. Denies vaginal bleeding. Good fetal movement.  Past Medical History  Diagnosis Date  . Urinary tract infection   . Chlamydia   . Preterm labor   . No pertinent past medical history    Past Surgical History  Procedure Date  . No past surgeries    History   Social History  . Marital Status: Single    Spouse Name: N/A    Number of Children: N/A  . Years of Education: N/A   Occupational History  . Not on file.   Social History Main Topics  . Smoking status: Current Everyday Smoker -- 0.2 packs/day for 5 years    Types: Cigarettes  . Smokeless tobacco: Never Used  . Alcohol Use: No  . Drug Use: No  . Sexually Active: Yes   Other Topics Concern  . Not on file   Social History Narrative  . No narrative on file   No current facility-administered medications on file prior to encounter.   Current Outpatient Prescriptions on File Prior to Encounter  Medication Sig Dispense Refill  . Ranitidine HCl (ZANTAC PO) Take 1 tablet by mouth daily as needed.  For gas.        No Known Allergies  ROS: Pertinent items in HPI  OBJECTIVE  BP 103/66  Pulse 64  Temp(Src) 97.8 F (36.6 C) (Oral)  Resp 20  SpO2 99%  LMP 04/28/2011   Physical Exam:  General: WN/WD, appears uncomfortable [id number], NT, c/w 20 wk size Pelvic: NEFG, Spec: mod amt thin milky discharge, no vag/cx lesions, cx appears long closed.  Fern negative Back: mild-mod rt CVAT extends to rt low back. Mild left low back tenderness  DT FHR 150  Toco: No UCs  Results for orders placed during the hospital encounter of 09/14/11 (from the past 24 hour(s))  URINALYSIS, ROUTINE W REFLEX MICROSCOPIC     Status: Abnormal   Collection Time   09/14/11 10:57 AM      Component Value Range   Color, Urine YELLOW  YELLOW    Appearance CLEAR  CLEAR    Specific Gravity, Urine 1.010  1.005 - 1.030    pH 6.5  5.0 - 8.0    Glucose, UA NEGATIVE  NEGATIVE (mg/dL)   Hgb urine dipstick LARGE (*) NEGATIVE    Bilirubin Urine NEGATIVE  NEGATIVE    Ketones, ur NEGATIVE  NEGATIVE (mg/dL)   Protein, ur 30 (*) NEGATIVE (mg/dL)   Urobilinogen, UA 0.2  0.0 - 1.0 (mg/dL)   Nitrite POSITIVE (*) NEGATIVE    Leukocytes, UA MODERATE (*) NEGATIVE   URINE MICROSCOPIC-ADD ON     Status: Abnormal   Collection Time   09/14/11 10:57 AM      Component Value Range   Squamous Epithelial / LPF RARE  RARE    WBC, UA 21-50  <3 (WBC/hpf)   RBC / HPF 11-20  <3 (RBC/hpf)   Bacteria, UA MANY (*)  RARE   WET PREP, GENITAL     Status: Abnormal   Collection Time   09/14/11 12:40 PM      Component Value Range   Yeast, Wet Prep FEW (*) NONE SEEN    Trich, Wet Prep NONE SEEN  NONE SEEN    Clue Cells, Wet Prep NONE SEEN  NONE SEEN    WBC, Wet Prep HPF POC MODERATE (*) NONE SEEN   CBC     Status: Abnormal   Collection Time   09/14/11  1:30 PM      Component Value Range   WBC 10.4  4.0 - 10.5 (K/uL)   RBC 3.14 (*) 3.87 - 5.11 (MIL/uL)   Hemoglobin 9.7 (*) 12.0 - 15.0 (g/dL)   HCT 40.9 (*) 81.1 - 46.0 (%)   MCV 89.2  78.0 - 100.0 (fL)   MCH 30.9  26.0 - 34.0 (pg)   MCHC 34.6  30.0 - 36.0 (g/dL)   RDW 91.4  78.2 - 95.6 (%)   Platelets 114 (*) 150 - 400 (K/uL)    ASSESSMENT  UTI, possibly early pyelo   PLAN  C/W Dr. Senaida Ores: Initial IM dose of Rocephin then d/c home on Bactrim DS x 7 d and Tylenol for pain Pt had to leave abruptly before she got the  Tylenol and she refused the IM Rocephin. Cautioned to drink lots of fluids, start the po Bactrim today and call if any F/C, N/V, malaise.

## 2011-09-14 NOTE — Progress Notes (Signed)
Called to pt room and states she has an emergency at home and has to leave. Pt states she has called her ride and they will be with here in 15 min. Pt states she would like to get her RX before she leaves, D Poe CNM notified. Pt signed AMA form.

## 2011-09-16 LAB — URINE CULTURE
Colony Count: >=100000
Culture  Setup Time: 201210111828
Special Requests: NORMAL

## 2011-09-18 LAB — URINALYSIS, ROUTINE W REFLEX MICROSCOPIC
Bilirubin Urine: NEGATIVE
Glucose, UA: NEGATIVE
Hgb urine dipstick: NEGATIVE
Ketones, ur: NEGATIVE
Nitrite: NEGATIVE
Protein, ur: NEGATIVE
Specific Gravity, Urine: 1.01
Urobilinogen, UA: 0.2
pH: 6.5

## 2011-12-05 NOTE — L&D Delivery Note (Signed)
Delivery Note At 4:18 PM a viable female was delivered via  (Presentation: OA ; LOT  ).  APGAR: 9,9 ; weight 7#6.   Placenta status: delivered, intact .  Cord:  3VC with the following complications: none  Anesthesia: Epidural  Episiotomy: none Lacerations: none Suture Repair: N/A Est. Blood Loss (mL): 500  Mom to postpartum.  Baby to nursery-stable  BOVARD,Sanaia Jasso 01/30/2012, 4:33 PM  A+, Br/Bo, [place], [place]

## 2011-12-22 ENCOUNTER — Inpatient Hospital Stay (HOSPITAL_COMMUNITY)
Admission: AD | Admit: 2011-12-22 | Discharge: 2011-12-23 | Disposition: A | Discharge status: Home / Self Care | Payer: Medicaid Other | Source: Ambulatory Visit | Attending: Obstetrics and Gynecology | Admitting: Obstetrics and Gynecology

## 2011-12-22 ENCOUNTER — Encounter (HOSPITAL_COMMUNITY): Payer: Self-pay | Admitting: *Deleted

## 2011-12-22 DIAGNOSIS — R109 Unspecified abdominal pain: Secondary | ICD-10-CM | POA: Insufficient documentation

## 2011-12-22 DIAGNOSIS — B9689 Other specified bacterial agents as the cause of diseases classified elsewhere: Secondary | ICD-10-CM

## 2011-12-22 DIAGNOSIS — O2390 Unspecified genitourinary tract infection in pregnancy, unspecified trimester: Secondary | ICD-10-CM | POA: Insufficient documentation

## 2011-12-22 DIAGNOSIS — B3731 Acute candidiasis of vulva and vagina: Secondary | ICD-10-CM

## 2011-12-22 DIAGNOSIS — B373 Candidiasis of vulva and vagina: Secondary | ICD-10-CM

## 2011-12-22 DIAGNOSIS — A499 Bacterial infection, unspecified: Secondary | ICD-10-CM | POA: Insufficient documentation

## 2011-12-22 DIAGNOSIS — N760 Acute vaginitis: Secondary | ICD-10-CM | POA: Insufficient documentation

## 2011-12-22 DIAGNOSIS — O4700 False labor before 37 completed weeks of gestation, unspecified trimester: Secondary | ICD-10-CM

## 2011-12-22 LAB — URINALYSIS, ROUTINE W REFLEX MICROSCOPIC
Glucose, UA: NEGATIVE mg/dL
Hgb urine dipstick: NEGATIVE
Ketones, ur: NEGATIVE mg/dL
Protein, ur: NEGATIVE mg/dL
pH: 6 (ref 5.0–8.0)

## 2011-12-22 LAB — URINE MICROSCOPIC-ADD ON

## 2011-12-22 LAB — WET PREP, GENITAL: Yeast Wet Prep HPF POC: NONE SEEN

## 2011-12-22 MED ORDER — TERCONAZOLE 0.4 % VA CREA: 1.0000 | TOPICAL_CREAM | Freq: Every day | VAGINAL | Status: AC

## 2011-12-22 MED ORDER — METRONIDAZOLE 500 MG PO TABS: 500.0000 mg | ORAL_TABLET | Freq: Two times a day (BID) | ORAL | Status: DC

## 2011-12-22 NOTE — Progress Notes (Signed)
Pt states, " I've had vaginal itching  3 days and burning when I urinate for 2 days. I feel like it is a cross between a bacterial infection or UTI."

## 2011-12-22 NOTE — ED Provider Notes (Signed)
History     Chief Complaint  Patient presents with  . Abdominal Pain   HPI Jaime Mendoza 21 y.o. 34w 0d gestation  Comes to MAU with increased pressure.  Is worried about preterm labor.  History of preterm labor with a previous pregnancy.   OB History    Grav Para Term Preterm Abortions TAB SAB Ect Mult Living   4 2 1 1 1  0 1 0 0 2      Past Medical History  Diagnosis Date  . Urinary tract infection   . Chlamydia   . Preterm labor   . No pertinent past medical history   . UTI in pregnancy     Past Surgical History  Procedure Date  . No past surgeries     Family History  Problem Relation Age of Onset  . Anesthesia problems Neg Hx   . Hypotension Neg Hx   . Malignant hyperthermia Neg Hx   . Pseudochol deficiency Neg Hx     History  Substance Use Topics  . Smoking status: Current Everyday Smoker -- 0.2 packs/day for 5 years    Types: Cigarettes  . Smokeless tobacco: Never Used  . Alcohol Use: No    Allergies: No Known Allergies  Prescriptions prior to admission  Medication Sig Dispense Refill  . Prenatal Vit-Fe Fumarate-FA (PRENATAL MULTIVITAMIN) TABS Take 1 tablet by mouth daily.      . Ranitidine HCl (ZANTAC PO) Take 1 tablet by mouth daily as needed.  For gas.         Review of Systems  Gastrointestinal: Negative for abdominal pain.  Genitourinary: Negative for dysuria.       Increased pressure   Physical Exam   Blood pressure 106/66, pulse 106, temperature 98.8 F (37.1 C), temperature source Oral, resp. rate 20, height 5\' 11"  (1.803 m), weight 137 lb (62.143 kg), last menstrual period 04/28/2011.  Physical Exam  Nursing note and vitals reviewed. Constitutional: She is oriented to person, place, and time. She appears well-developed and well-nourished.  HENT:  Head: Normocephalic.  Eyes: EOM are normal.  Neck: Neck supple.  GI: Soft. There is no tenderness.       On fetal monitor - FHT baseline is 135.  Strip is reactive.  Ocassional  contraction.  Genitourinary:       Speculum exam: Vulva - liquid discharge on labia and perineum Vagina - Mod amount of frothy pale yellow discharge, no odor Cervix - No contact bleeding Bimanual exam: Cervix - int os closed, posterior, soft Uterus gravid No tenderness over bladder Adnexa non tender, no masses bilaterally  wet prep done Chaperone present for exam.  Musculoskeletal: Normal range of motion.  Neurological: She is alert and oriented to person, place, and time.  Skin: Skin is warm and dry.  Psychiatric: She has a normal mood and affect.    MAU Course  Procedures  MDM Results for orders placed during the hospital encounter of 12/22/11 (from the past 24 hour(s))  URINALYSIS, ROUTINE W REFLEX MICROSCOPIC     Status: Abnormal   Collection Time   12/22/11 10:20 PM      Component Value Range   Color, Urine YELLOW  YELLOW    APPearance CLEAR  CLEAR    Specific Gravity, Urine 1.015  1.005 - 1.030    pH 6.0  5.0 - 8.0    Glucose, UA NEGATIVE  NEGATIVE (mg/dL)   Hgb urine dipstick NEGATIVE  NEGATIVE    Bilirubin Urine NEGATIVE  NEGATIVE  Ketones, ur NEGATIVE  NEGATIVE (mg/dL)   Protein, ur NEGATIVE  NEGATIVE (mg/dL)   Urobilinogen, UA 0.2  0.0 - 1.0 (mg/dL)   Nitrite NEGATIVE  NEGATIVE    Leukocytes, UA SMALL (*) NEGATIVE   URINE MICROSCOPIC-ADD ON     Status: Abnormal   Collection Time   12/22/11 10:20 PM      Component Value Range   Squamous Epithelial / LPF MANY (*) RARE    WBC, UA 0-2  <3 (WBC/hpf)   RBC / HPF 0-2  <3 (RBC/hpf)   Bacteria, UA FEW (*) RARE    Urine-Other YEAST    WET PREP, GENITAL     Status: Abnormal   Collection Time   12/22/11 11:15 PM      Component Value Range   Yeast, Wet Prep NONE SEEN  NONE SEEN    Trich, Wet Prep NONE SEEN  NONE SEEN    Clue Cells, Wet Prep MODERATE (*) NONE SEEN    WBC, Wet Prep HPF POC MODERATE (*) NONE SEEN    Reviewed plan of care with Dr. Ambrose Mantle  Assessment and Plan  Yeast infection Bacterial  vaginosis  Plan Will prescribe terazol vaginal cream for yeast  Will prescribe metronidazole PO for BV - client not able to tolerate Flagyl - will change to clindamycin Not in labor - internal cervical os is closed Occassional contraction with reactive strip Keep your appointment in the office  Call your doctor if your symptoms worsen Do kick counts if you think they baby is not moving.   Jaime Mendoza 12/22/2011, 11:17 PM   Nolene Bernheim, NP 12/22/11 [id number]  Nolene Bernheim, NP 12/23/11 0001  Nolene Bernheim, NP 12/23/11 0010

## 2011-12-22 NOTE — Progress Notes (Signed)
G3P2 at 34.0wks. Has been having abdominal pain and pressure in buttocks since this am. No bleeding. Some cl, thick vaginal d/c this wk. States has been leaking some and has been checked in office for ruptured membranes and always negative. Induced at 33wks last pregnancy due to [facility]

## 2011-12-22 NOTE — Progress Notes (Signed)
Wet prep only obtained 

## 2011-12-22 NOTE — Progress Notes (Signed)
Lilyan Punt Np in to see pt. Spec exam done and wet prep obtained. Pt tol well.

## 2011-12-23 MED ORDER — CLINDAMYCIN HCL 150 MG PO CAPS: 300.0000 mg | ORAL_CAPSULE | Freq: Two times a day (BID) | ORAL | Status: DC

## 2011-12-23 NOTE — ED Notes (Signed)
Lilyan Punt NP in to see pt and discuss d/c plan.

## 2011-12-23 NOTE — Progress Notes (Signed)
Written and verbal d/c instructions given and understanding voiced. 

## 2012-01-03 ENCOUNTER — Encounter (HOSPITAL_COMMUNITY): Payer: Self-pay | Admitting: *Deleted

## 2012-01-03 ENCOUNTER — Inpatient Hospital Stay (HOSPITAL_COMMUNITY)
Admission: AD | Admit: 2012-01-03 | Discharge: 2012-01-03 | Disposition: A | Discharge status: Home / Self Care | Payer: Medicaid Other | Source: Ambulatory Visit | Attending: Obstetrics and Gynecology | Admitting: Obstetrics and Gynecology

## 2012-01-03 DIAGNOSIS — O4700 False labor before 37 completed weeks of gestation, unspecified trimester: Secondary | ICD-10-CM | POA: Insufficient documentation

## 2012-01-03 DIAGNOSIS — O479 False labor, unspecified: Secondary | ICD-10-CM

## 2012-01-03 NOTE — Progress Notes (Signed)
Pt complaining of SROM

## 2012-01-03 NOTE — Progress Notes (Signed)
Pt states she had mucous last night and watery fluid today

## 2012-01-03 NOTE — Progress Notes (Signed)
Pt reports back pain and pressure x 2 hours. Denies bleeding. Reports "leaking fluids for a minute" but has been checked in the office and it wasn't amniotic fluid

## 2012-01-03 NOTE — ED Provider Notes (Signed)
History     Chief Complaint  Patient presents with  . Labor Eval   HPI 21 y.o. [id number] at [redacted]w[redacted]d c/o low back pain and pressure, ? Leaking fluid tonight. No bleeding. + fetal movement.     Past Medical History  Diagnosis Date  . Urinary tract infection   . Chlamydia   . Preterm labor   . No pertinent past medical history   . UTI in pregnancy   . Asthma     Past Surgical History  Procedure Date  . No past surgeries   . Cesarean section     Family History  Problem Relation Age of Onset  . Anesthesia problems Neg Hx   . Hypotension Neg Hx   . Malignant hyperthermia Neg Hx   . Pseudochol deficiency Neg Hx     History  Substance Use Topics  . Smoking status: Current Everyday Smoker -- 0.2 packs/day for 5 years    Types: Cigarettes  . Smokeless tobacco: Never Used  . Alcohol Use: No    Allergies: No Known Allergies  Prescriptions prior to admission  Medication Sig Dispense Refill  . clindamycin (CLEOCIN) 150 MG capsule Take 2 capsules (300 mg total) by mouth 2 (two) times daily. Cancel previous metronidazole prescription.  14 capsule  0  . Prenatal Vit-Fe Fumarate-FA (PRENATAL MULTIVITAMIN) TABS Take 1 tablet by mouth daily.      . Ranitidine HCl (ZANTAC PO) Take 1 tablet by mouth daily as needed.  For gas.         Review of Systems  Constitutional: Negative.   Respiratory: Negative.   Cardiovascular: Negative.   Gastrointestinal: Negative for nausea, vomiting, abdominal pain, diarrhea and constipation.  Genitourinary: Negative for dysuria, urgency, frequency, hematuria and flank pain.       Negative for vaginal bleeding, + contractions   Musculoskeletal: Positive for back pain.  Neurological: Negative.   Psychiatric/Behavioral: Negative.    Physical Exam   Blood pressure 116/65, pulse 102, temperature 98.8 F (37.1 C), resp. rate 18, height 5\' 11"  (1.803 m), weight 140 lb (63.504 kg), last menstrual period 04/28/2011, SpO2 99.00%.  Physical Exam    Nursing note and vitals reviewed. Constitutional: She is oriented to person, place, and time. She appears well-developed and well-nourished. No distress.  Cardiovascular: Normal rate.   Respiratory: Effort normal.  GI: Soft. There is no tenderness.  Genitourinary: Vaginal discharge (white, no pooling) found.       SVE: 1/thick/-2/posterior  Musculoskeletal: Normal range of motion.  Neurological: She is alert and oriented to person, place, and time.  Skin: Skin is warm and dry.  Psychiatric: She has a normal mood and affect.   EFM: reactive, TOCO: initially q 4-5 minutes, decreased after observation  No change in cervical exam after 1 hour MAU Course  Procedures     Assessment and Plan  21 y.o. [id number] at [redacted]w[redacted]d Threatened preterm labor - no active labor Rev'd precautions  Katrinka Herbison 01/03/2012, 10:08 PM

## 2012-01-05 LAB — STREP B DNA PROBE: GBS: POSITIVE

## 2012-01-24 ENCOUNTER — Telehealth (HOSPITAL_COMMUNITY): Payer: Self-pay | Admitting: *Deleted

## 2012-01-24 ENCOUNTER — Encounter (HOSPITAL_COMMUNITY): Payer: Self-pay | Admitting: *Deleted

## 2012-01-24 NOTE — Telephone Encounter (Signed)
Preadmission screen  

## 2012-01-29 ENCOUNTER — Other Ambulatory Visit: Payer: Self-pay | Admitting: Obstetrics and Gynecology

## 2012-01-29 NOTE — H&P (Addendum)
  21yo [id number] @ 39+ for iol given term status and favorable cervix.  PNC largely uncomplicated, pt is tobacco user and has h/o HSV, on valtex for herpes supression, GBBS +.  +FM, no LOF, no VB, occ ctx  PMH migraines, ?asthma PSH none POBGynHX G1 SAB G2 SVD 6#12 G3 34 wk SVD after PPROM G4 present No abn pap HSV - on valtrex Meds albuterol, PNV, Valtrex All NKDA SH +tob, no ETOH, no drugs, single FH colon CA, CAD, DM, HTN, seizure d/o  ROS denies  AFVSS gen NAD CV RRR Lungs CTAB Abd soft, FNT Ext sym, NT  SVE 2+, Fundal height appropriate for gestation, EFW 7#  PNL A+, Ab Scr neg, Hgb 12.6, Pap WNL, RI, RPR NR, HepBsAg neg. HIV neg, Plt 180K, Hgb electro WNL, GC neg, Chl neg, CF neg, First tri scfreen WNL, glucola 86, GBBS +  [place] at 12 wk cwd; nl anat  A&P 21yo [id number] @ 39+ for iol gbbs + - prophylaxis with PCN Pitocin to induce, AROM p PCN Epidural for comfort Expect SVD

## 2012-01-30 ENCOUNTER — Inpatient Hospital Stay (HOSPITAL_COMMUNITY): Payer: Medicaid Other | Admitting: Anesthesiology

## 2012-01-30 ENCOUNTER — Encounter (HOSPITAL_COMMUNITY): Payer: Self-pay | Admitting: Anesthesiology

## 2012-01-30 ENCOUNTER — Encounter (HOSPITAL_COMMUNITY): Payer: Self-pay

## 2012-01-30 ENCOUNTER — Inpatient Hospital Stay (HOSPITAL_COMMUNITY)
Admission: RE | Admit: 2012-01-30 | Discharge: 2012-02-01 | DRG: 775 | Disposition: A | Discharge status: Home / Self Care | Payer: Medicaid Other | Source: Ambulatory Visit | Attending: Obstetrics and Gynecology | Admitting: Obstetrics and Gynecology

## 2012-01-30 DIAGNOSIS — Z3480 Encounter for supervision of other normal pregnancy, unspecified trimester: Secondary | ICD-10-CM

## 2012-01-30 LAB — CBC
Hemoglobin: 10.1 g/dL — ABNORMAL LOW (ref 12.0–15.0)
MCH: 30.7 pg (ref 26.0–34.0)
MCHC: 34 g/dL (ref 30.0–36.0)
Platelets: 154 10*3/uL (ref 150–400)

## 2012-01-30 LAB — RPR: RPR Ser Ql: NONREACTIVE

## 2012-01-30 MED ORDER — PENICILLIN G POTASSIUM 5000000 UNITS IJ SOLR
2.5000 10*6.[IU] | INTRAVENOUS | Status: DC
  Administered 2012-01-30: 2.5 10*6.[IU] via INTRAVENOUS
  Filled 2012-01-30 (×5): qty 2.5

## 2012-01-30 MED ORDER — ZOLPIDEM TARTRATE 5 MG PO TABS: 5.0000 mg | ORAL_TABLET | Freq: Every evening | ORAL | Status: DC | PRN

## 2012-01-30 MED ORDER — ACETAMINOPHEN 325 MG PO TABS: 650.0000 mg | ORAL_TABLET | ORAL | Status: DC | PRN

## 2012-01-30 MED ORDER — FLEET ENEMA 7-19 GM/118ML RE ENEM: 1.0000 | ENEMA | RECTAL | Status: DC | PRN

## 2012-01-30 MED ORDER — OXYTOCIN BOLUS FROM INFUSION
500.0000 mL | Freq: Once | INTRAVENOUS | Status: AC
  Administered 2012-01-30: 500 mL via INTRAVENOUS
  Filled 2012-01-30: qty 500

## 2012-01-30 MED ORDER — VALACYCLOVIR HCL 500 MG PO TABS
500.0000 mg | ORAL_TABLET | Freq: Every day | ORAL | Status: DC
  Filled 2012-01-30 (×2): qty 1

## 2012-01-30 MED ORDER — LANOLIN HYDROUS EX OINT: TOPICAL_OINTMENT | CUTANEOUS | Status: DC | PRN

## 2012-01-30 MED ORDER — ONDANSETRON HCL 4 MG/2ML IJ SOLN
4.0000 mg | Freq: Four times a day (QID) | INTRAMUSCULAR | Status: DC | PRN
  Administered 2012-01-30: 4 mg via INTRAVENOUS
  Filled 2012-01-30: qty 2

## 2012-01-30 MED ORDER — ONDANSETRON HCL 4 MG PO TABS: 4.0000 mg | ORAL_TABLET | ORAL | Status: DC | PRN

## 2012-01-30 MED ORDER — ALBUTEROL SULFATE HFA 108 (90 BASE) MCG/ACT IN AERS: 2.0000 | INHALATION_SPRAY | Freq: Four times a day (QID) | RESPIRATORY_TRACT | Status: DC | PRN

## 2012-01-30 MED ORDER — EPHEDRINE 5 MG/ML INJ
10.0000 mg | INTRAVENOUS | Status: DC | PRN
  Filled 2012-01-30: qty 4

## 2012-01-30 MED ORDER — PHENYLEPHRINE 40 MCG/ML (10ML) SYRINGE FOR IV PUSH (FOR BLOOD PRESSURE SUPPORT): 80.0000 ug | PREFILLED_SYRINGE | INTRAVENOUS | Status: DC | PRN

## 2012-01-30 MED ORDER — LACTATED RINGERS IV SOLN
500.0000 mL | Freq: Once | INTRAVENOUS | Status: AC
  Administered 2012-01-30: 500 mL via INTRAVENOUS

## 2012-01-30 MED ORDER — CITRIC ACID-SODIUM CITRATE 334-500 MG/5ML PO SOLN: 30.0000 mL | ORAL | Status: DC | PRN

## 2012-01-30 MED ORDER — LIDOCAINE HCL (PF) 1 % IJ SOLN
INTRAMUSCULAR | Status: DC | PRN
  Administered 2012-01-30 (×2): 4 mL
  Administered 2012-01-30: 2 mL
  Administered 2012-01-30: 4 mL

## 2012-01-30 MED ORDER — MEDROXYPROGESTERONE ACETATE 150 MG/ML IM SUSP
150.0000 mg | INTRAMUSCULAR | Status: AC | PRN
  Administered 2012-02-01: 150 mg via INTRAMUSCULAR
  Filled 2012-01-30: qty 1

## 2012-01-30 MED ORDER — LIDOCAINE HCL (PF) 1 % IJ SOLN
30.0000 mL | INTRAMUSCULAR | Status: DC | PRN
  Filled 2012-01-30: qty 30

## 2012-01-30 MED ORDER — OXYTOCIN 20 UNITS IN LACTATED RINGERS INFUSION - SIMPLE
1.0000 m[IU]/min | INTRAVENOUS | Status: DC
  Administered 2012-01-30: 2 m[IU]/min via INTRAVENOUS
  Filled 2012-01-30: qty 1000

## 2012-01-30 MED ORDER — PHENYLEPHRINE 40 MCG/ML (10ML) SYRINGE FOR IV PUSH (FOR BLOOD PRESSURE SUPPORT)
80.0000 ug | PREFILLED_SYRINGE | INTRAVENOUS | Status: DC | PRN
  Filled 2012-01-30: qty 5

## 2012-01-30 MED ORDER — DIPHENHYDRAMINE HCL 50 MG/ML IJ SOLN: 12.5000 mg | INTRAMUSCULAR | Status: DC | PRN

## 2012-01-30 MED ORDER — DIPHENHYDRAMINE HCL 25 MG PO CAPS: 25.0000 mg | ORAL_CAPSULE | Freq: Four times a day (QID) | ORAL | Status: DC | PRN

## 2012-01-30 MED ORDER — LACTATED RINGERS IV SOLN: INTRAVENOUS | Status: DC

## 2012-01-30 MED ORDER — LACTATED RINGERS IV SOLN
500.0000 mL | INTRAVENOUS | Status: DC | PRN
  Administered 2012-01-30: 1000 mL via INTRAVENOUS

## 2012-01-30 MED ORDER — WITCH HAZEL-GLYCERIN EX PADS: 1.0000 "application " | MEDICATED_PAD | CUTANEOUS | Status: DC | PRN

## 2012-01-30 MED ORDER — ONDANSETRON HCL 4 MG/2ML IJ SOLN: 4.0000 mg | INTRAMUSCULAR | Status: DC | PRN

## 2012-01-30 MED ORDER — SIMETHICONE 80 MG PO CHEW: 80.0000 mg | CHEWABLE_TABLET | ORAL | Status: DC | PRN

## 2012-01-30 MED ORDER — TETANUS-DIPHTH-ACELL PERTUSSIS 5-2.5-18.5 LF-MCG/0.5 IM SUSP: 0.5000 mL | Freq: Once | INTRAMUSCULAR | Status: DC

## 2012-01-30 MED ORDER — PRENATAL MULTIVITAMIN CH: 1.0000 | ORAL_TABLET | Freq: Every day | ORAL | Status: DC

## 2012-01-30 MED ORDER — LACTATED RINGERS IV SOLN
INTRAVENOUS | Status: DC
  Administered 2012-01-30: 125 mL/h via INTRAVENOUS
  Administered 2012-01-30: 15:00:00 via INTRAVENOUS

## 2012-01-30 MED ORDER — FENTANYL 2.5 MCG/ML BUPIVACAINE 1/10 % EPIDURAL INFUSION (WH - ANES)
14.0000 mL/h | INTRAMUSCULAR | Status: DC
  Administered 2012-01-30: 14 mL/h via EPIDURAL
  Administered 2012-01-30: 16 mL/h via EPIDURAL
  Filled 2012-01-30 (×2): qty 60

## 2012-01-30 MED ORDER — PENICILLIN G POTASSIUM 5000000 UNITS IJ SOLR
5.0000 10*6.[IU] | Freq: Once | INTRAMUSCULAR | Status: AC
  Administered 2012-01-30: 5 10*6.[IU] via INTRAVENOUS
  Filled 2012-01-30: qty 5

## 2012-01-30 MED ORDER — IBUPROFEN 600 MG PO TABS
600.0000 mg | ORAL_TABLET | Freq: Four times a day (QID) | ORAL | Status: DC
  Administered 2012-01-30 – 2012-02-01 (×5): 600 mg via ORAL
  Filled 2012-01-30 (×6): qty 1

## 2012-01-30 MED ORDER — DIBUCAINE 1 % RE OINT: 1.0000 "application " | TOPICAL_OINTMENT | RECTAL | Status: DC | PRN

## 2012-01-30 MED ORDER — OXYCODONE-ACETAMINOPHEN 5-325 MG PO TABS
1.0000 | ORAL_TABLET | ORAL | Status: DC | PRN
  Administered 2012-01-31 – 2012-02-01 (×3): 1 via ORAL
  Filled 2012-01-30 (×3): qty 1

## 2012-01-30 MED ORDER — EPHEDRINE 5 MG/ML INJ: 10.0000 mg | INTRAVENOUS | Status: DC | PRN

## 2012-01-30 MED ORDER — TERBUTALINE SULFATE 1 MG/ML IJ SOLN: 0.2500 mg | Freq: Once | INTRAMUSCULAR | Status: DC | PRN

## 2012-01-30 MED ORDER — SENNOSIDES-DOCUSATE SODIUM 8.6-50 MG PO TABS
2.0000 | ORAL_TABLET | Freq: Every day | ORAL | Status: DC
  Administered 2012-01-30 – 2012-01-31 (×2): 2 via ORAL

## 2012-01-30 MED ORDER — PRENATAL MULTIVITAMIN CH
1.0000 | ORAL_TABLET | Freq: Every day | ORAL | Status: DC
  Administered 2012-01-30 – 2012-02-01 (×3): 1 via ORAL
  Filled 2012-01-30 (×3): qty 1

## 2012-01-30 MED ORDER — BENZOCAINE-MENTHOL 20-0.5 % EX AERO: 1.0000 "application " | INHALATION_SPRAY | CUTANEOUS | Status: DC | PRN

## 2012-01-30 MED ORDER — OXYTOCIN 20 UNITS IN LACTATED RINGERS INFUSION - SIMPLE: 125.0000 mL/h | Freq: Once | INTRAVENOUS | Status: DC

## 2012-01-30 MED ORDER — OXYCODONE-ACETAMINOPHEN 5-325 MG PO TABS: 1.0000 | ORAL_TABLET | ORAL | Status: DC | PRN

## 2012-01-30 MED ORDER — IBUPROFEN 600 MG PO TABS: 600.0000 mg | ORAL_TABLET | Freq: Four times a day (QID) | ORAL | Status: DC | PRN

## 2012-01-30 NOTE — Anesthesia Procedure Notes (Signed)
Epidural Patient location during procedure: OB Start time: 01/30/2012 1:14 PM Reason for block: procedure for pain  Staffing Performed by: anesthesiologist   Preanesthetic Checklist Completed: patient identified, site marked, surgical consent, pre-op evaluation, timeout performed, IV checked, risks and benefits discussed and monitors and equipment checked  Epidural Patient position: sitting Prep: site prepped and draped and DuraPrep Patient monitoring: continuous pulse ox and blood pressure Approach: midline Injection technique: LOR air  Needle:  Needle type: Tuohy  Needle gauge: 17 G Needle length: 9 cm Needle insertion depth: 4 cm Catheter type: closed end flexible Catheter size: 19 Gauge Catheter at skin depth: 9 cm Test dose: negative  Assessment Events: blood not aspirated, injection not painful, no injection resistance, negative IV test and no paresthesia  Additional Notes Discussed risk of headache, infection, bleeding, nerve injury and failed or incomplete block.  Patient voices understanding and wishes to proceed.

## 2012-01-30 NOTE — Progress Notes (Signed)
Jaime Mendoza is a 21 y.o. [id number] at [redacted]w[redacted]d admitted for induction of labor due to Elective at term.  Subjective: No c/o's  Objective: BP 113/75  Pulse 84  Temp(Src) 98 F (36.7 C) (Oral)  Resp 17  Ht 5\' 11"  (1.803 m)  Wt 65.772 kg (145 lb)  BMI 20.22 kg/m2  LMP 04/28/2011      FHT:  FHR: 130 bpm, variability: moderate,  accelerations:  Present,  decelerations:  Absent UC:   regular, every 4 minutes SVE:    2.3/50/-1 R Simpson RN  Labs: Lab Results  Component Value Date   WBC 7.7 01/30/2012   HGB 10.1* 01/30/2012   HCT 29.7* 01/30/2012   MCV 90.3 01/30/2012   PLT 154 01/30/2012    Assessment / Plan: Induction of labor due to term with favorable cervix,  progressing well on pitocin  Labor: Progressing normally, will start pitocin and AROM after PCN Preeclampsia:  no signs or symptoms of toxicity Fetal Wellbeing:  Category I Pain Control:  Epidural and and IV meds prn I/D:  n/a Anticipated MOD:  NSVD  BOVARD,Dajane Valli 01/30/2012, 8:54 AM

## 2012-01-30 NOTE — Progress Notes (Signed)
Jaime Mendoza is a 21 y.o. [id number] at [redacted]w[redacted]d admitted for induction of labor due to Elective at term.  Subjective: Some ctx, o/w no c/o's  Objective: BP 133/70  Pulse 78  Temp(Src) 98 F (36.7 C) (Oral)  Resp 13  Ht 5\' 11"  (1.803 m)  Wt 65.772 kg (145 lb)  BMI 20.22 kg/m2  LMP 04/28/2011      FHT:  FHR: 130 bpm, variability: moderate,  accelerations:  Present,  decelerations:  Absent UC:   regular, every 2-4 minutes SVE:   Dilation: 4.5 Effacement (%): 50 Station: -1 Exam by::J BOVARD, MD Pitocin at 28mU/min AROM for clear fluid, w/o diff/comp  Labs: Lab Results  Component Value Date   WBC 7.7 01/30/2012   HGB 10.1* 01/30/2012   HCT 29.7* 01/30/2012   MCV 90.3 01/30/2012   PLT 154 01/30/2012    Assessment / Plan: Induction of labor due to term with favorable cervix,  progressing well on pitocin  Labor: Progressing normally Preeclampsia:  no signs or symptoms of toxicity Fetal Wellbeing:  Category I Pain Control:  Epidural and IV pain meds prn I/D:  n/a Anticipated MOD:  NSVD  BOVARD,Jaime Mendoza 01/30/2012, 12:59 PM

## 2012-01-30 NOTE — Anesthesia Preprocedure Evaluation (Signed)
Anesthesia Evaluation  Patient identified by MRN, date of birth, ID band Patient awake    Reviewed: Allergy & Precautions, H&P , NPO status , Patient's Chart, lab work & pertinent test results, reviewed documented beta blocker date and time   History of Anesthesia Complications Negative for: history of anesthetic complications  Airway Mallampati: III TM Distance: >3 FB Neck ROM: full    Dental  (+) Teeth Intact   Pulmonary asthma (last used inhaler yesterday morning, usually uses once a week) , Current Smoker (1/4 ppd),  clear to auscultation        Cardiovascular neg cardio ROS regular Normal    Neuro/Psych Negative Neurological ROS  Negative Psych ROS   GI/Hepatic negative GI ROS, Neg liver ROS,   Endo/Other  Negative Endocrine ROS  Renal/GU negative Renal ROS  Genitourinary negative   Musculoskeletal   Abdominal   Peds  Hematology negative hematology ROS (+)   Anesthesia Other Findings   Reproductive/Obstetrics (+) Pregnancy                           Anesthesia Physical Anesthesia Plan  ASA: II  Anesthesia Plan: Epidural   Post-op Pain Management:    Induction:   Airway Management Planned:   Additional Equipment:   Intra-op Plan:   Post-operative Plan:   Informed Consent: I have reviewed the patients History and Physical, chart, labs and discussed the procedure including the risks, benefits and alternatives for the proposed anesthesia with the patient or authorized representative who has indicated his/her understanding and acceptance.     Plan Discussed with:   Anesthesia Plan Comments:         Anesthesia Quick Evaluation

## 2012-01-31 ENCOUNTER — Encounter (HOSPITAL_COMMUNITY): Payer: Self-pay

## 2012-01-31 LAB — CBC
HCT: 24.1 % — ABNORMAL LOW (ref 36.0–46.0)
Hemoglobin: 8.2 g/dL — ABNORMAL LOW (ref 12.0–15.0)
MCH: 30.7 pg (ref 26.0–34.0)
MCV: 90.3 fL (ref 78.0–100.0)
RBC: 2.67 MIL/uL — ABNORMAL LOW (ref 3.87–5.11)

## 2012-01-31 NOTE — Anesthesia Postprocedure Evaluation (Signed)
  Anesthesia Post-op Note  Patient: Jaime Mendoza  Procedure(s) Performed: * No procedures listed *  Patient Location: Mother/Baby  Anesthesia Type: Epidural  Level of Consciousness: alert  and oriented  Airway and Oxygen Therapy: Patient Spontanous Breathing  Post-op Pain: mild  Post-op Assessment: Patient's Cardiovascular Status Stable and Respiratory Function Stable  Post-op Vital Signs: stable  Complications: No apparent anesthesia complications

## 2012-01-31 NOTE — Progress Notes (Signed)
UR chart review completed.  

## 2012-01-31 NOTE — Progress Notes (Signed)
Post Partum Day 1 Subjective: no complaints, voiding and tolerating PO nl lochia, pain controlled  Objective: Blood pressure 106/65, pulse 54, temperature 96.5 F (35.8 C), temperature source Oral, resp. rate 18, height 5\' 11"  (1.803 m), weight 65.772 kg (145 lb), last menstrual period 04/28/2011, SpO2 99.00%, unknown if currently breastfeeding.  Physical Exam:  General: alert and no distress Lochia: appropriate Uterine Fundus: firm   Basename 01/31/12 0547 01/30/12 0815  HGB 8.2* 10.1*  HCT 24.1* 29.7*    Assessment/Plan: Plan for discharge tomorrow and Contraception Depo prior to discharge   LOS: 1 day   BOVARD,Matas Burrows 01/31/2012, 8:44 AM

## 2012-02-01 MED ORDER — IBUPROFEN 800 MG PO TABS: 800.0000 mg | ORAL_TABLET | Freq: Three times a day (TID) | ORAL | Status: AC | PRN

## 2012-02-01 MED ORDER — OXYCODONE-ACETAMINOPHEN 5-325 MG PO TABS: 1.0000 | ORAL_TABLET | Freq: Four times a day (QID) | ORAL | Status: AC | PRN

## 2012-02-01 MED ORDER — PRENATAL MULTIVITAMIN CH: 1.0000 | ORAL_TABLET | Freq: Every day | ORAL | Status: DC

## 2012-02-01 NOTE — Progress Notes (Signed)
Post Partum Day 2 Subjective: no complaints, voiding, tolerating PO and nl lochia, pain controlled  Objective: Blood pressure 112/73, pulse 52, temperature 98.4 F (36.9 C), temperature source Oral, resp. rate 18, height 5\' 11"  (1.803 m), weight 65.772 kg (145 lb), last menstrual period 04/28/2011, SpO2 99.00%, unknown if currently breastfeeding.  Physical Exam:  General: alert and no distress Lochia: appropriate Uterine Fundus: firm   Basename 01/31/12 0547 01/30/12 0815  HGB 8.2* 10.1*  HCT 24.1* 29.7*    Assessment/Plan: Plan for discharge tomorrow, Lactation consult and Contraception Depo d/c home with Motrin/Percocet/ PNV; f/u 6 weeks   LOS: 2 days   BOVARD,Makeyla Govan 02/01/2012, 9:04 AM

## 2012-02-01 NOTE — Discharge Summary (Signed)
Obstetric Discharge Summary Reason for Admission: induction of labor Prenatal Procedures: none Intrapartum Procedures: spontaneous vaginal delivery Postpartum Procedures: none Complications-Operative and Postpartum: none Hemoglobin  Date Value Range Status  01/31/2012 8.2* 12.0-15.0 (g/dL) Final     DELTA CHECK NOTED     REPEATED TO VERIFY     HCT  Date Value Range Status  01/31/2012 24.1* 36.0-46.0 (%) Final    Discharge Diagnoses: Term Pregnancy-delivered  Discharge Information: Date: 02/01/2012 Activity: pelvic rest Diet: routine Medications: PNV, Ibuprofen and Percocet Condition: stable Instructions: refer to practice specific booklet Discharge to: home Follow-up Information    Follow up with BOVARD,Hideo Googe, MD. Schedule an appointment as soon as possible for a visit in 6 weeks.   Contact information:   [address] [facility] [address] [place] [address] [phone]          Newborn Data: Live born female  Birth Weight: 7 lb 6 oz (3345 g) APGAR: 9, 9  Home with mother.  BOVARD,Coreyon Nicotra 02/01/2012, 9:13 AM

## 2012-12-04 NOTE — L&D Delivery Note (Signed)
Delivery Note At 7:49 PM a viable female was delivered via  (Presentation: ;  ).  APGAR: , ; weight .   Placenta status: , .  Cord:  with the following complications: .  Cord pH: not done  Anesthesia:   Episiotomy:  Lacerations:  Suture Repair: 2.0 Est. Blood Loss (mL):   Mom to postpartum.  Baby to nursery-stable.  MARSHALL,BERNARD A 06/28/2013, 7:55 PM

## 2012-12-13 ENCOUNTER — Encounter (HOSPITAL_COMMUNITY): Payer: Self-pay | Admitting: Family

## 2012-12-13 ENCOUNTER — Inpatient Hospital Stay (HOSPITAL_COMMUNITY): Payer: Medicaid Other

## 2012-12-13 ENCOUNTER — Inpatient Hospital Stay (HOSPITAL_COMMUNITY)
Admission: AD | Admit: 2012-12-13 | Discharge: 2012-12-13 | Disposition: A | Discharge status: Home / Self Care | Payer: Medicaid Other | Source: Ambulatory Visit | Attending: Obstetrics and Gynecology | Admitting: Obstetrics and Gynecology

## 2012-12-13 DIAGNOSIS — R109 Unspecified abdominal pain: Secondary | ICD-10-CM | POA: Insufficient documentation

## 2012-12-13 DIAGNOSIS — A5901 Trichomonal vulvovaginitis: Secondary | ICD-10-CM | POA: Insufficient documentation

## 2012-12-13 DIAGNOSIS — R350 Frequency of micturition: Secondary | ICD-10-CM | POA: Insufficient documentation

## 2012-12-13 DIAGNOSIS — O98819 Other maternal infectious and parasitic diseases complicating pregnancy, unspecified trimester: Secondary | ICD-10-CM | POA: Insufficient documentation

## 2012-12-13 DIAGNOSIS — O2390 Unspecified genitourinary tract infection in pregnancy, unspecified trimester: Secondary | ICD-10-CM

## 2012-12-13 LAB — WET PREP, GENITAL: Yeast Wet Prep HPF POC: NONE SEEN

## 2012-12-13 LAB — URINALYSIS, ROUTINE W REFLEX MICROSCOPIC
Glucose, UA: NEGATIVE mg/dL
Leukocytes, UA: NEGATIVE
Nitrite: NEGATIVE
Specific Gravity, Urine: >1.03 — ABNORMAL HIGH (ref 1.005–1.030)
pH: 6 (ref 5.0–8.0)

## 2012-12-13 LAB — POCT PREGNANCY, URINE: Preg Test, Ur: POSITIVE — AB

## 2012-12-13 MED ORDER — METRONIDAZOLE 500 MG PO TABS: 500.0000 mg | ORAL_TABLET | Freq: Two times a day (BID) | ORAL | Status: DC

## 2012-12-13 NOTE — Discharge Instructions (Signed)
ABCs of Pregnancy A Antepartum care is very important. Be sure you see your doctor and get prenatal care as soon as you think you are pregnant. At this time, you will be tested for infection, genetic abnormalities and potential problems with you and the pregnancy. This is the time to discuss diet, exercise, work, medications, labor, pain medication during labor and the possibility of a cesarean delivery. Ask any questions that may concern you. It is important to see your doctor regularly throughout your pregnancy. Avoid exposure to toxic substances and chemicals - such as cleaning solvents, lead and mercury, some insecticides, and paint. Pregnant women should avoid exposure to paint fumes, and fumes that cause you to feel ill, dizzy or faint. When possible, it is a good idea to have a pre-pregnancy consultation with your caregiver to begin some important recommendations your caregiver suggests such as, taking folic acid, exercising, quitting smoking, avoiding alcoholic beverages, etc. B Breastfeeding is the healthiest choice for both you and your baby. It has many nutritional benefits for the baby and health benefits for the mother. It also creates a very tight and loving bond between the baby and mother. Talk to your doctor, your family and friends, and your employer about how you choose to feed your baby and how they can support you in your decision. Not all birth defects can be prevented, but a woman can take actions that may increase her chance of having a healthy baby. Many birth defects happen very early in pregnancy, sometimes before a woman even knows she is pregnant. Birth defects or abnormalities of any child in your or the father's family should be discussed with your caregiver. Get a good support bra as your breast size changes. Wear it especially when you exercise and when nursing.  C Celebrate the news of your pregnancy with the your spouse/father and family. Childbirth classes are helpful to  take for you and the spouse/father because it helps to understand what happens during the pregnancy, labor and delivery. Cesarean delivery should be discussed with your doctor so you are prepared for that possibility. The pros and cons of circumcision if it is a boy, should be discussed with your pediatrician. Cigarette smoking during pregnancy can result in low birth weight babies. It has been associated with infertility, miscarriages, tubal pregnancies, infant death (mortality) and poor health (morbidity) in childhood. Additionally, cigarette smoking may cause long-term learning disabilities. If you smoke, you should try to quit before getting pregnant and not smoke during the pregnancy. Secondary smoke may also harm a mother and her developing baby. It is a good idea to ask people to stop smoking around you during your pregnancy and after the baby is born. Extra calcium is necessary when you are pregnant and is found in your prenatal vitamin, in dairy products, green leafy vegetables and in calcium supplements. D A healthy diet according to your current weight and height, along with vitamins and mineral supplements should be discussed with your caregiver. Domestic abuse or violence should be made known to your doctor right away to get the situation corrected. Drink more water when you exercise to keep hydrated. Discomfort of your back and legs usually develops and progresses from the middle of the second trimester through to delivery of the baby. This is because of the enlarging baby and uterus, which may also affect your balance. Do not take illegal drugs. Illegal drugs can seriously harm the baby and you. Drink extra fluids (water is best) throughout pregnancy to help  your body keep up with the increases in your blood volume. Drink at least 6 to 8 glasses of water, fruit juice, or milk each day. A good way to know you are drinking enough fluid is when your urine looks almost like clear water or is very light  yellow.  E Eat healthy to get the nutrients you and your unborn baby need. Your meals should include the five basic food groups. Exercise (30 minutes of light to moderate exercise a day) is important and encouraged during pregnancy, if there are no medical problems or problems with the pregnancy. Exercise that causes discomfort or dizziness should be stopped and reported to your caregiver. Emotions during pregnancy can change from being ecstatic to depression and should be understood by you, your partner and your family. F Fetal screening with ultrasound, amniocentesis and monitoring during pregnancy and labor is common and sometimes necessary. Take 400 micrograms of folic acid daily both before, when possible, and during the first few months of pregnancy to reduce the risk of birth defects of the brain and spine. All women who could possibly become pregnant should take a vitamin with folic acid, every day. It is also important to eat a healthy diet with fortified foods (enriched grain products, including cereals, rice, breads, and pastas) and foods with natural sources of folate (orange juice, green leafy vegetables, beans, peanuts, broccoli, asparagus, peas, and lentils). The father should be involved with all aspects of the pregnancy including, the prenatal care, childbirth classes, labor, delivery, and postpartum time. Fathers may also have emotional concerns about being a father, financial needs, and raising a family. G Genetic testing should be done appropriately. It is important to know your family and the father's history. If there have been problems with pregnancies or birth defects in your family, report these to your doctor. Also, genetic counselors can talk with you about the information you might need in making decisions about having a family. You can call a major medical center in your area for help in finding a board-certified genetic counselor. Genetic testing and counseling should be done  before pregnancy when possible, especially if there is a history of problems in the mother's or father's family. Certain ethnic backgrounds are more at risk for genetic defects. H Get familiar with the hospital where you will be having your baby. Get to know how long it takes to get there, the labor and delivery area, and the hospital procedures. Be sure your medical insurance is accepted there. Get your home ready for the baby including, clothes, the baby's room (when possible), furniture and car seat. Hand washing is important throughout the day, especially after handling raw meat and poultry, changing the baby's diaper or using the bathroom. This can help prevent the spread of many bacteria and viruses that cause infection. Your hair may become dry and thinner, but will return to normal a few weeks after the baby is born. Heartburn is a common problem that can be treated by taking antacids recommended by your caregiver, eating smaller meals 5 or 6 times a day, not drinking liquids when eating, drinking between meals and raising the head of your bed 2 to 3 inches. I Insurance to cover you, the baby, doctor and hospital should be reviewed so that you will be prepared to pay any costs not covered by your insurance plan. If you do not have medical insurance, there are usually clinics and services available for you in your community. Take 30 milligrams of iron during  your pregnancy as prescribed by your doctor to reduce the risk of low red blood cells (anemia) later in pregnancy. All women of childbearing age should eat a diet rich in iron. J There should be a joint effort for the mother, father and any other children to adapt to the pregnancy financially, emotionally, and psychologically during the pregnancy. Join a support group for moms-to-be. Or, join a class on parenting or childbirth. Have the family participate when possible. K Know your limits. Let your caregiver know if you experience any of the  following:   Pain of any kind.  Strong cramps.  You develop a lot of weight in a short period of time (5 pounds in 3 to 5 days).  Vaginal bleeding, leaking of amniotic fluid.  Headache, vision problems.  Dizziness, fainting, shortness of breath.  Chest pain.  Fever of 102 F (38.9 C) or higher.  Gush of clear fluid from your vagina.  Painful urination.  Domestic violence.  Irregular heartbeat (palpitations).  Rapid beating of the heart (tachycardia).  Constant feeling sick to your stomach (nauseous) and vomiting.  Trouble walking, fluid retention (edema).  Muscle weakness.  If your baby has decreased activity.  Persistent diarrhea.  Abnormal vaginal discharge.  Uterine contractions at 20-minute intervals.  Back pain that travels down your leg. L Learn and practice that what you eat and drink should be in moderation and healthy for you and your baby. Legal drugs such as alcohol and caffeine are important issues for pregnant women. There is no safe amount of alcohol a woman can drink while pregnant. Fetal alcohol syndrome, a disorder characterized by growth retardation, facial abnormalities, and central nervous system dysfunction, is caused by a woman's use of alcohol during pregnancy. Caffeine, found in tea, coffee, soft drinks and chocolate, should also be limited. Be sure to read labels when trying to cut down on caffeine during pregnancy. More than 200 foods, beverages, and over-the-counter medications contain caffeine and have a high salt content! There are coffees and teas that do not contain caffeine. M Medical conditions such as diabetes, epilepsy, and high blood pressure should be treated and kept under control before pregnancy when possible, but especially during pregnancy. Ask your caregiver about any medications that may need to be changed or adjusted during pregnancy. If you are currently taking any medications, ask your caregiver if it is safe to take them  while you are pregnant or before getting pregnant when possible. Also, be sure to discuss any herbs or vitamins you are taking. They are medicines, too! Discuss with your doctor all medications, prescribed and over-the-counter, that you are taking. During your prenatal visit, discuss the medications your doctor may give you during labor and delivery. N Never be afraid to ask your doctor or caregiver questions about your health, the progress of the pregnancy, family problems, stressful situations, and recommendation for a pediatrician, if you do not have one. It is better to take all precautions and discuss any questions or concerns you may have during your office visits. It is a good idea to write down your questions before you visit the doctor. O Over-the-counter cough and cold remedies may contain alcohol or other ingredients that should be avoided during pregnancy. Ask your caregiver about prescription, herbs or over-the-counter medications that you are taking or may consider taking while pregnant.  P Physical activity during pregnancy can benefit both you and your baby by lessening discomfort and fatigue, providing a sense of well-being, and increasing the likelihood of  early recovery after delivery. Light to moderate exercise during pregnancy strengthens the belly (abdominal) and back muscles. This helps improve posture. Practicing yoga, walking, swimming, and cycling on a stationary bicycle are usually safe exercises for pregnant women. Avoid scuba diving, exercise at high altitudes (over 3000 feet), skiing, horseback riding, contact sports, etc. Always check with your doctor before beginning any kind of exercise, especially during pregnancy and especially if you did not exercise before getting pregnant. Q Queasiness, stomach upset and morning sickness are common during pregnancy. Eating a couple of crackers or dry toast before getting out of bed. Foods that you normally love may make you feel sick to  your stomach. You may need to substitute other nutritious foods. Eating 5 or 6 small meals a day instead of 3 large ones may make you feel better. Do not drink with your meals, drink between meals. Questions that you have should be written down and asked during your prenatal visits. R Read about and make plans to baby-proof your home. There are important tips for making your home a safer environment for your baby. Review the tips and make your home safer for you and your baby. Read food labels regarding calories, salt and fat content in the food. S Saunas, hot tubs, and steam rooms should be avoided while you are pregnant. Excessive high heat may be harmful during your pregnancy. Your caregiver will screen and examine you for sexually transmitted diseases and genetic disorders during your prenatal visits. Learn the signs of labor. Sexual relations while pregnant is safe unless there is a medical or pregnancy problem and your caregiver advises against it. T Traveling long distances should be avoided especially in the third trimester of your pregnancy. If you do have to travel out of state, be sure to take a copy of your medical records and medical insurance plan with you. You should not travel long distances without seeing your doctor first. Most airlines will not allow you to travel after 36 weeks of pregnancy. Toxoplasmosis is an infection caused by a parasite that can seriously harm an unborn baby. Avoid eating undercooked meat and handling cat litter. Be sure to wear gloves when gardening. Tingling of the hands and fingers is not unusual and is due to fluid retention. This will go away after the baby is born. U Womb (uterus) size increases during the first trimester. Your kidneys will begin to function more efficiently. This may cause you to feel the need to urinate more often. You may also leak urine when sneezing, coughing or laughing. This is due to the growing uterus pressing against your bladder,  which lies directly in front of and slightly under the uterus during the first few months of pregnancy. If you experience burning along with frequency of urination or bloody urine, be sure to tell your doctor. The size of your uterus in the third trimester may cause a problem with your balance. It is advisable to maintain good posture and avoid wearing high heels during this time. An ultrasound of your baby may be necessary during your pregnancy and is safe for you and your baby. V Vaccinations are an important concern for pregnant women. Get needed vaccines before pregnancy. Center for Disease Control ([employer]) has clear guidelines for the use of vaccines during pregnancy. Review the list, be sure to discuss it with your doctor. Prenatal vitamins are helpful and healthy for you and the baby. Do not take extra vitamins except what is recommended. Taking too much of certain  vitamins can cause overdose problems. Continuous vomiting should be reported to your caregiver. Varicose veins may appear especially if there is a family history of varicose veins. They should subside after the delivery of the baby. Support hose helps if there is leg discomfort. W Being overweight or underweight during pregnancy may cause problems. Try to get within 15 pounds of your ideal weight before pregnancy. Remember, pregnancy is not a time to be dieting! Do not stop eating or start skipping meals as your weight increases. Both you and your baby need the calories and nutrition you receive from a healthy diet. Be sure to consult with your doctor about your diet. There is a formula and diet plan available depending on whether you are overweight or underweight. Your caregiver or nutritionist can help and advise you if necessary. X Avoid X-rays. If you must have dental work or diagnostic tests, tell your dentist or physician that you are pregnant so that extra care can be taken. X-rays should only be taken when the risks of not taking  them outweigh the risk of taking them. If needed, only the minimum amount of radiation should be used. When X-rays are necessary, protective lead shields should be used to cover areas of the body that are not being X-rayed. Y Your baby loves you. Breastfeeding your baby creates a loving and very close bond between the two of you. Give your baby a healthy environment to live in while you are pregnant. Infants and children require constant care and guidance. Their health and safety should be carefully watched at all times. After the baby is born, rest or take a nap when the baby is sleeping. Z Get your ZZZs. Be sure to get plenty of rest. Resting on your side as often as possible, especially on your left side is advised. It provides the best circulation to your baby and helps reduce swelling. Try taking a nap for 30 to 45 minutes in the afternoon when possible. After the baby is born rest or take a nap when the baby is sleeping. Try elevating your feet for that amount of time when possible. It helps the circulation in your legs and helps reduce swelling.  Most information courtesy of the CDC. Document Released: 11/20/2005 Document Revised: 02/12/2012 Document Reviewed: 08/04/2009 [facility] Patient Information 2013 [place], [place]. Trichomoniasis Trichomoniasis is an infection, caused by the Trichomonas organism, that affects both women and men. In women, the outer female genitalia and the vagina are affected. In men, the penis is mainly affected, but the prostate and other reproductive organs can also be involved. Trichomoniasis is a sexually transmitted disease (STD) and is most often passed to another person through sexual contact. The majority of people who get trichomoniasis, do so from a sexual encounter and are also at risk for other STDs, including gonorrhea and chlamydia, hepatitis B, and HIV. Trichomoniasis can increase the risk for HIV and pregnancy problems. SYMPTOMS   Abnormal gray-green, frothy  vaginal discharge in women.  Itching and irritation of the area outside the vagina in women.  Penile discharge with or without pain in males.  Inflammation of the urethra (urethritis), causing painful urination.  Bleeding after sexual intercourse. HOME CARE INSTRUCTIONS   Take all medication prescribed by your caregiver.  Take over-the-counter medication for itching or irritation as directed by your caregiver.  Do not have sexual intercourse while you have the infection.  Do not douche or wear tampons while you have the infection.  Discuss the infection with your partner,  as your partner may have acquired the infection from you. Or, your partner may have been the person who transmitted the infection to you.  Have your sex partner examined and treated if necessary.  Practice safe, informed, and protected sex.  See your caregiver for other STD testing. SEEK MEDICAL CARE IF:  You still have symptoms after you finish the medication.  You have an oral temperature above 102 F (38.9 C).  You develop belly (abdominal) pain.  You have pain when you urinate.  You have bleeding after sexual intercourse.  You develop a rash.  The medication makes you sick or throw up (vomit). Document Released: 12/28/2004 Document Revised: 02/12/2012 Document Reviewed: 06/11/2009 [facility] Patient Information 2013 [place], [place].

## 2012-12-13 NOTE — MAU Provider Note (Signed)
History     CSN: [id number]  Arrival date and time: 12/13/12 1011   None     Chief Complaint  Patient presents with  . Abdominal Pain  . Urinary Frequency   HPI Jaime Mendoza is 22 y.o. [id number] [redacted]w[redacted]d weeks presenting with abdominal pain and cramping with urination X 1 week, ? Yeast infection vs bacterial infection.  Patient was unaware that she is pregnant.  Thinks her Oct cycle was mid month.  Did not have menstrual cycle in November.  LMP 12/23--it was light, lasting only 3 days.  By Oct period would be [redacted]w[redacted]d.  Denies vaginal bleeding.  States contraceptive methods have not worked for her in the past.      Past Medical History  Diagnosis Date  . Urinary tract infection   . Chlamydia   . Preterm labor   . No pertinent past medical history   . UTI in pregnancy   . Asthma   . HSV-2 infection complicating pregnancy   . Normal pregnancy, repeat 01/30/2012  . SVD (spontaneous vaginal delivery) 01/30/2012    Past Surgical History  Procedure Date  . No past surgeries     Family History  Problem Relation Age of Onset  . Anesthesia problems Neg Hx   . Hypotension Neg Hx   . Malignant hyperthermia Neg Hx   . Pseudochol deficiency Neg Hx   . Cancer Father     colon  . Seizures Sister   . Diabetes Maternal Grandmother   . Heart disease Maternal Grandfather   . Heart disease Paternal Grandfather     History  Substance Use Topics  . Smoking status: Current Every Day Smoker -- 0.2 packs/day for 5 years    Types: Cigarettes  . Smokeless tobacco: Never Used  . Alcohol Use: Yes     Comment: occasional     Allergies: No Known Allergies  No prescriptions prior to admission    Review of Systems  Constitutional: Negative.   HENT: Negative.   Gastrointestinal: Negative.  Negative for abdominal pain.  Genitourinary:       + for vaginal discharge with odor.  Neg for vaginal bleeding.   Physical Exam   Blood pressure 107/58, pulse 75, temperature 97.9 F (36.6 C),  temperature source Oral, resp. rate 16, height 5\' 9"  (1.753 m), weight 117 lb (53.071 kg), last menstrual period 11/25/2012, SpO2 100.00%, unknown if currently breastfeeding.  Physical Exam  Constitutional: She is oriented to person, place, and time. She appears well-developed and well-nourished. No distress.  HENT:  Head: Normocephalic.  Neck: Normal range of motion.  Cardiovascular: Normal rate.   Respiratory: Effort normal.  GI: Soft. She exhibits no distension and no mass. There is no tenderness. There is no rebound and no guarding.  Genitourinary: There is no rash, tenderness or lesion on the right labia. There is no rash, tenderness or lesion on the left labia. Uterus is enlarged (12 week size). Uterus is not tender. Cervix exhibits no motion tenderness, no discharge and no friability. Right adnexum displays no tenderness. Left adnexum displays no tenderness. No bleeding around the vagina. Vaginal discharge (moderate amount of foul discharge) found.  Neurological: She is alert and oriented to person, place, and time.  Skin: Skin is warm and dry.  Psychiatric: She has a normal mood and affect. Her behavior is normal.   Results for orders placed during the hospital encounter of 12/13/12 (from the past 24 hour(s))  URINALYSIS, ROUTINE W REFLEX MICROSCOPIC  Status: Abnormal   Collection Time   12/13/12 10:46 AM      Component Value Range   Color, Urine YELLOW  YELLOW   APPearance CLEAR  CLEAR   Specific Gravity, Urine >1.030 (*) 1.005 - 1.030   pH 6.0  5.0 - 8.0   Glucose, UA NEGATIVE  NEGATIVE mg/dL   Hgb urine dipstick NEGATIVE  NEGATIVE   Bilirubin Urine NEGATIVE  NEGATIVE   Ketones, ur NEGATIVE  NEGATIVE mg/dL   Protein, ur NEGATIVE  NEGATIVE mg/dL   Urobilinogen, UA 0.2  0.0 - 1.0 mg/dL   Nitrite NEGATIVE  NEGATIVE   Leukocytes, UA NEGATIVE  NEGATIVE  POCT PREGNANCY, URINE     Status: Abnormal   Collection Time   12/13/12 10:56 AM      Component Value Range   Preg Test, Ur  POSITIVE (*) NEGATIVE  WET PREP, GENITAL     Status: Abnormal   Collection Time   12/13/12 11:20 AM      Component Value Range   Yeast Wet Prep HPF POC NONE SEEN  NONE SEEN   Trich, Wet Prep MANY (*) NONE SEEN   Clue Cells Wet Prep HPF POC FEW (*) NONE SEEN   WBC, Wet Prep HPF POC MODERATE (*) NONE SEEN     MAU Course  Procedures  MDM   Bedside ultrasound shows Intrauterine pregnancy with cardiac motion and fetal movement.   At the beginning of the exam, patient stated she was planning to terminate pregnancy.  After seeing the U/S picture, she states she plans to carry the pregnancy, begin prenatal care with Dr. Ellyn Hack, and have a tubal ligation after delivery 12:12  Reported MSE, physical and lab findings.  Order given for ultrasound for dating.  Treat Trichomonal infection   Begin prenatal care in office.  Assessment and Plan  A:  Trichomonas in pregnancy      [redacted] weeks pregnant by exam      11w 5d by ultrasound  P:  Rx for Flagyl to pharmacy      Begin prenatal care with Dr. Ellyn Hack.--call office for appointment     Stressed need to have partner treated for trichomonas with his doctor   Matt Holmes 12/13/2012, 11:21 AM

## 2012-12-13 NOTE — MAU Note (Signed)
Patient reports to MAU with c/o cramping during urination, burning and itching in vagina. She reports she has a hx of bacterial vaginosis and reports she feels she has that again now also.  Patient was unsure if she was pregnant until her visit today. When told her urine pregnancy test was positive, she reports concerns about her drinking lately and this pregnancy. Is unsure of her true LMP; reports two days of bleeding starting on 11/25/12. Before that, last period was in October.

## 2012-12-13 NOTE — MAU Note (Signed)
Patient states that when she has urinary frequency and when that happens she has abdominal pain. Denies any bleeding or vaginal discharge.

## 2012-12-14 LAB — GC/CHLAMYDIA PROBE AMP: GC Probe RNA: NEGATIVE

## 2013-04-12 ENCOUNTER — Encounter (HOSPITAL_COMMUNITY): Payer: Self-pay

## 2013-04-12 ENCOUNTER — Inpatient Hospital Stay (HOSPITAL_COMMUNITY)
Admission: AD | Admit: 2013-04-12 | Discharge: 2013-04-12 | Disposition: A | Discharge status: Home / Self Care | Payer: Medicaid Other | Source: Ambulatory Visit | Attending: Obstetrics | Admitting: Obstetrics

## 2013-04-12 DIAGNOSIS — R109 Unspecified abdominal pain: Secondary | ICD-10-CM | POA: Insufficient documentation

## 2013-04-12 DIAGNOSIS — O26899 Other specified pregnancy related conditions, unspecified trimester: Secondary | ICD-10-CM

## 2013-04-12 DIAGNOSIS — O99891 Other specified diseases and conditions complicating pregnancy: Secondary | ICD-10-CM | POA: Insufficient documentation

## 2013-04-12 DIAGNOSIS — O9989 Other specified diseases and conditions complicating pregnancy, childbirth and the puerperium: Secondary | ICD-10-CM

## 2013-04-12 LAB — URINALYSIS, ROUTINE W REFLEX MICROSCOPIC
Bilirubin Urine: NEGATIVE
Glucose, UA: NEGATIVE mg/dL
Ketones, ur: 15 mg/dL — AB
Nitrite: NEGATIVE
Specific Gravity, Urine: 1.02 (ref 1.005–1.030)
pH: 7 (ref 5.0–8.0)

## 2013-04-12 LAB — URINE MICROSCOPIC-ADD ON

## 2013-04-12 LAB — WET PREP, GENITAL: Trich, Wet Prep: NONE SEEN

## 2013-04-12 NOTE — MAU Note (Signed)
Intermittent cramping, vaginal bleeding, tightening of stomach, has only had 2 visits with [facility] was dismissed because missed too many appointments, first appointment with Dr. Gaynell Face is 5/12.

## 2013-04-12 NOTE — MAU Provider Note (Signed)
History     CSN: [id number]  Arrival date and time: 04/12/13 1648   First Provider Initiated Contact with Patient 04/12/13 1740      Chief Complaint  Patient presents with  . Abdominal Cramping   HPI Jaime Mendoza is 22 y.o. [phone] [redacted]w[redacted]d weeks presenting with abdominal pain that began 4 hrs ago.  She describes as intermittent.  States it is a "mild contraction".    She had a gush of fluid with some blood earlier today "about 1pm".  Ate peaches at 9am and not again 3 pm.  Hx of PROM at 28weeks and stayed in hospital until [redacted] weeks gestation.  Last intercourse yesterday am.  She has her first appointment on Monday with Dr. Gaynell Face.  Denies nausea, vomiting, chills, fever or UTI sxs.  Patient was diagnosed with Trichomonas in 1/14 and did not complete medication    Past Medical History  Diagnosis Date  . Urinary tract infection   . Chlamydia   . Preterm labor   . No pertinent past medical history   . UTI in pregnancy   . Asthma   . HSV-2 infection complicating pregnancy   . Normal pregnancy, repeat 01/30/2012  . SVD (spontaneous vaginal delivery) 01/30/2012    Past Surgical History  Procedure Laterality Date  . No past surgeries      Family History  Problem Relation Age of Onset  . Anesthesia problems Neg Hx   . Hypotension Neg Hx   . Malignant hyperthermia Neg Hx   . Pseudochol deficiency Neg Hx   . Cancer Father     colon  . Seizures Sister   . Diabetes Maternal Grandmother   . Heart disease Maternal Grandfather   . Heart disease Paternal Grandfather     History  Substance Use Topics  . Smoking status: Current Every Day Smoker -- 0.25 packs/day for 5 years    Types: Cigarettes  . Smokeless tobacco: Never Used  . Alcohol Use: Yes     Comment: occasional     Allergies: No Known Allergies  No prescriptions prior to admission    Review of Systems  Constitutional: Negative for fever and chills.  Gastrointestinal: Positive for abdominal pain (cramping).  Negative for nausea and vomiting.  Genitourinary:       Gush of fluid ?ROM + vaginal spotting  Neurological: Negative for headaches.   Physical Exam   Blood pressure 97/54, pulse 97, temperature 97.8 F (36.6 C), temperature source Oral, resp. rate 18, last menstrual period 11/25/2012.  Physical Exam  Constitutional: She is oriented to person, place, and time. She appears well-developed and well-nourished. No distress.  HENT:  Head: Normocephalic.  Neck: Normal range of motion.  Cardiovascular: Normal rate.   Respiratory: Effort normal.  GI: Soft. She exhibits no distension and no mass. There is no tenderness. There is no rebound and no guarding.  Genitourinary:  Neg for pooling of fluid and blood in the vaginal canal.  There is pink discharge on the qtip collected for wet prep. CERVICAL EXAM by Elnita Maxwell, Rn Closed, thick and posterior  Neurological: She is alert and oriented to person, place, and time.  Skin: Skin is warm and dry.  Psychiatric: She has a normal mood and affect. Her behavior is normal.   Results for orders placed during the hospital encounter of 04/12/13 (from the past 24 hour(s))  URINALYSIS, ROUTINE W REFLEX MICROSCOPIC     Status: Abnormal   Collection Time    04/12/13  5:05 PM  Result Value Range   Color, Urine YELLOW  YELLOW   APPearance CLEAR  CLEAR   Specific Gravity, Urine 1.020  1.005 - 1.030   pH 7.0  5.0 - 8.0   Glucose, UA NEGATIVE  NEGATIVE mg/dL   Hgb urine dipstick TRACE (*) NEGATIVE   Bilirubin Urine NEGATIVE  NEGATIVE   Ketones, ur 15 (*) NEGATIVE mg/dL   Protein, ur NEGATIVE  NEGATIVE mg/dL   Urobilinogen, UA 1.0  0.0 - 1.0 mg/dL   Nitrite NEGATIVE  NEGATIVE   Leukocytes, UA NEGATIVE  NEGATIVE  URINE MICROSCOPIC-ADD ON     Status: Abnormal   Collection Time    04/12/13  5:05 PM      Result Value Range   Squamous Epithelial / LPF FEW (*) RARE   WBC, UA 3-6  <3 WBC/hpf   RBC / HPF 0-2  <3 RBC/hpf   Urine-Other MUCOUS PRESENT    WET  PREP, GENITAL     Status: Abnormal   Collection Time    04/12/13  5:50 PM      Result Value Range   Yeast Wet Prep HPF POC NONE SEEN  NONE SEEN   Trich, Wet Prep NONE SEEN  NONE SEEN   Clue Cells Wet Prep HPF POC NONE SEEN  NONE SEEN   WBC, Wet Prep HPF POC FEW (*) NONE SEEN   MAU Course  Procedures  GC/CHL to lab  MDM 18:34  Reported MSE, previous history, UA and FMS findings to Dr. Clearance Coots.  If wet prep is +, treat,  D/C to home and keep scheduled appointment with Dr. Gaynell Face.        FMS-baseline  140, moderate variability.  Reassuring for 28 weeks.  Uterine irritability.  No contractions WET PREP-NEG  Assessment and Plan  A:  Cramping at [redacted]w[redacted]d gestation--closed cervix      Hx of PROM      P:  Keep scheduled appointment with Dr. Gaynell Face for Monday      Important to stay well hydrated and eat regularly     Pelvic rest until seen by Dr. Nolberto Hanlon 04/12/2013, 6:43 PM

## 2013-04-14 LAB — GC/CHLAMYDIA PROBE AMP: GC Probe RNA: NEGATIVE

## 2013-05-21 LAB — OB RESULTS CONSOLE HEPATITIS B SURFACE ANTIGEN: Hepatitis B Surface Ag: NEGATIVE

## 2013-05-21 LAB — OB RESULTS CONSOLE RPR
RPR: NONREACTIVE
RPR: NONREACTIVE

## 2013-05-21 LAB — OB RESULTS CONSOLE GC/CHLAMYDIA: Chlamydia: NEGATIVE

## 2013-05-21 LAB — OB RESULTS CONSOLE HIV ANTIBODY (ROUTINE TESTING)
HIV: NONREACTIVE
HIV: NONREACTIVE

## 2013-06-03 ENCOUNTER — Encounter (HOSPITAL_COMMUNITY): Payer: Self-pay | Admitting: *Deleted

## 2013-06-03 ENCOUNTER — Inpatient Hospital Stay (HOSPITAL_COMMUNITY)
Admission: AD | Admit: 2013-06-03 | Discharge: 2013-06-04 | Disposition: A | Discharge status: Home / Self Care | Payer: Medicaid Other | Source: Ambulatory Visit | Attending: Obstetrics | Admitting: Obstetrics

## 2013-06-03 DIAGNOSIS — O4700 False labor before 37 completed weeks of gestation, unspecified trimester: Secondary | ICD-10-CM | POA: Insufficient documentation

## 2013-06-03 NOTE — MAU Note (Signed)
PT SAYS SHE HURTS BAD SINCE 7 PM.       NO VE IN OFFICE.     HAS HX OF HSV DX IN 2009- ONLY   INITIAL  OUTBREAK  .  NOT TAKING  VALTREX YET.

## 2013-06-03 NOTE — MAU Note (Signed)
Pt G5 P3 at 36.2wks having contractions every , leaking clear fluid since 2030.

## 2013-06-21 ENCOUNTER — Inpatient Hospital Stay (HOSPITAL_COMMUNITY)
Admission: AD | Admit: 2013-06-21 | Discharge: 2013-06-21 | Disposition: A | Discharge status: Home / Self Care | Payer: Medicaid Other | Source: Ambulatory Visit | Attending: Obstetrics | Admitting: Obstetrics

## 2013-06-21 ENCOUNTER — Encounter (HOSPITAL_COMMUNITY): Payer: Self-pay | Admitting: *Deleted

## 2013-06-21 DIAGNOSIS — R109 Unspecified abdominal pain: Secondary | ICD-10-CM | POA: Insufficient documentation

## 2013-06-21 DIAGNOSIS — O479 False labor, unspecified: Secondary | ICD-10-CM | POA: Insufficient documentation

## 2013-06-21 NOTE — MAU Note (Signed)
I've been contracting all day and leaked some fluid while walking.

## 2013-06-21 NOTE — MAU Note (Signed)
SAYS HURT BAD   SINCE 3 PM.     ON Thursday - DR MARSHALL- VE  2 CM.    HAS HX- HSV- LAST OUTBREAK- 2010 -- TAKING VALTREX NOW-   DENIES OUTBREAK S/S NOW.

## 2013-06-28 ENCOUNTER — Encounter (HOSPITAL_COMMUNITY): Payer: Self-pay | Admitting: Anesthesiology

## 2013-06-28 ENCOUNTER — Inpatient Hospital Stay (HOSPITAL_COMMUNITY)
Admission: AD | Admit: 2013-06-28 | Discharge: 2013-06-30 | DRG: 774 | Disposition: A | Discharge status: Home / Self Care | Payer: Medicaid Other | Source: Ambulatory Visit | Attending: Obstetrics | Admitting: Obstetrics

## 2013-06-28 ENCOUNTER — Inpatient Hospital Stay (HOSPITAL_COMMUNITY): Payer: Medicaid Other | Admitting: Anesthesiology

## 2013-06-28 ENCOUNTER — Encounter (HOSPITAL_COMMUNITY): Payer: Self-pay | Admitting: *Deleted

## 2013-06-28 DIAGNOSIS — O98519 Other viral diseases complicating pregnancy, unspecified trimester: Secondary | ICD-10-CM | POA: Diagnosis present

## 2013-06-28 DIAGNOSIS — A6000 Herpesviral infection of urogenital system, unspecified: Secondary | ICD-10-CM | POA: Diagnosis present

## 2013-06-28 DIAGNOSIS — Z22330 Carrier of Group B streptococcus: Secondary | ICD-10-CM

## 2013-06-28 DIAGNOSIS — O99892 Other specified diseases and conditions complicating childbirth: Principal | ICD-10-CM | POA: Diagnosis present

## 2013-06-28 LAB — CBC
Hemoglobin: 9.9 g/dL — ABNORMAL LOW (ref 12.0–15.0)
Hemoglobin: 10.1 g/dL — ABNORMAL LOW (ref 12.0–15.0)
MCHC: 34.6 g/dL (ref 30.0–36.0)
MCV: 87.4 fL (ref 78.0–100.0)
Platelets: 115 10*3/uL — ABNORMAL LOW (ref 150–400)
Platelets: 108 10*3/uL — ABNORMAL LOW (ref 150–400)
RBC: 3.25 MIL/uL — ABNORMAL LOW (ref 3.87–5.11)
RBC: 3.31 MIL/uL — ABNORMAL LOW (ref 3.87–5.11)
WBC: 7 10*3/uL (ref 4.0–10.5)

## 2013-06-28 LAB — RPR: RPR Ser Ql: NONREACTIVE

## 2013-06-28 MED ORDER — DIBUCAINE 1 % RE OINT: 1.0000 "application " | TOPICAL_OINTMENT | RECTAL | Status: DC | PRN

## 2013-06-28 MED ORDER — LANOLIN HYDROUS EX OINT: TOPICAL_OINTMENT | CUTANEOUS | Status: DC | PRN

## 2013-06-28 MED ORDER — IBUPROFEN 600 MG PO TABS
600.0000 mg | ORAL_TABLET | Freq: Four times a day (QID) | ORAL | Status: DC | PRN
  Filled 2013-06-28 (×4): qty 1

## 2013-06-28 MED ORDER — EPHEDRINE 5 MG/ML INJ
10.0000 mg | INTRAVENOUS | Status: DC | PRN
  Filled 2013-06-28: qty 2
  Filled 2013-06-28: qty 4

## 2013-06-28 MED ORDER — FENTANYL 2.5 MCG/ML BUPIVACAINE 1/10 % EPIDURAL INFUSION (WH - ANES)
14.0000 mL/h | INTRAMUSCULAR | Status: DC | PRN
  Administered 2013-06-28: 14 mL/h via EPIDURAL
  Filled 2013-06-28: qty 125

## 2013-06-28 MED ORDER — FERROUS SULFATE 325 (65 FE) MG PO TABS
325.0000 mg | ORAL_TABLET | Freq: Two times a day (BID) | ORAL | Status: DC
  Administered 2013-06-29 – 2013-06-30 (×2): 325 mg via ORAL
  Filled 2013-06-28 (×2): qty 1

## 2013-06-28 MED ORDER — ACETAMINOPHEN 325 MG PO TABS: 650.0000 mg | ORAL_TABLET | ORAL | Status: DC | PRN

## 2013-06-28 MED ORDER — OXYTOCIN BOLUS FROM INFUSION
500.0000 mL | INTRAVENOUS | Status: DC
  Administered 2013-06-28: 500 mL via INTRAVENOUS

## 2013-06-28 MED ORDER — LACTATED RINGERS IV SOLN: 500.0000 mL | Freq: Once | INTRAVENOUS | Status: DC

## 2013-06-28 MED ORDER — ONDANSETRON HCL 4 MG/2ML IJ SOLN
4.0000 mg | INTRAMUSCULAR | Status: DC | PRN
Start: 2013-06-28 — End: 2013-06-30

## 2013-06-28 MED ORDER — DIPHENHYDRAMINE HCL 25 MG PO CAPS: 25.0000 mg | ORAL_CAPSULE | Freq: Four times a day (QID) | ORAL | Status: DC | PRN

## 2013-06-28 MED ORDER — BUTORPHANOL TARTRATE 1 MG/ML IJ SOLN
1.0000 mg | INTRAMUSCULAR | Status: DC | PRN
  Administered 2013-06-28 (×3): 1 mg via INTRAVENOUS
  Filled 2013-06-28 (×3): qty 1

## 2013-06-28 MED ORDER — SODIUM BICARBONATE 8.4 % IV SOLN
INTRAVENOUS | Status: DC | PRN
  Administered 2013-06-28: 5 mL via EPIDURAL

## 2013-06-28 MED ORDER — WITCH HAZEL-GLYCERIN EX PADS: 1.0000 "application " | MEDICATED_PAD | CUTANEOUS | Status: DC | PRN

## 2013-06-28 MED ORDER — ONDANSETRON HCL 4 MG/2ML IJ SOLN
4.0000 mg | Freq: Four times a day (QID) | INTRAMUSCULAR | Status: DC | PRN
  Administered 2013-06-28: 4 mg via INTRAVENOUS
  Filled 2013-06-28: qty 2

## 2013-06-28 MED ORDER — SENNOSIDES-DOCUSATE SODIUM 8.6-50 MG PO TABS
2.0000 | ORAL_TABLET | Freq: Every day | ORAL | Status: DC
  Administered 2013-06-29: 2 via ORAL

## 2013-06-28 MED ORDER — SODIUM CHLORIDE 0.9 % IV SOLN
2.0000 g | Freq: Once | INTRAVENOUS | Status: AC
  Administered 2013-06-28: 2 g via INTRAVENOUS
  Filled 2013-06-28: qty 2000

## 2013-06-28 MED ORDER — BENZOCAINE-MENTHOL 20-0.5 % EX AERO
1.0000 "application " | INHALATION_SPRAY | CUTANEOUS | Status: DC | PRN
  Filled 2013-06-28: qty 56

## 2013-06-28 MED ORDER — DIPHENHYDRAMINE HCL 50 MG/ML IJ SOLN: 12.5000 mg | INTRAMUSCULAR | Status: DC | PRN

## 2013-06-28 MED ORDER — TETANUS-DIPHTH-ACELL PERTUSSIS 5-2.5-18.5 LF-MCG/0.5 IM SUSP: 0.5000 mL | Freq: Once | INTRAMUSCULAR | Status: DC

## 2013-06-28 MED ORDER — LACTATED RINGERS IV SOLN
INTRAVENOUS | Status: DC
  Administered 2013-06-28 (×2): via INTRAVENOUS

## 2013-06-28 MED ORDER — LACTATED RINGERS IV SOLN: 500.0000 mL | INTRAVENOUS | Status: DC | PRN

## 2013-06-28 MED ORDER — PHENYLEPHRINE 40 MCG/ML (10ML) SYRINGE FOR IV PUSH (FOR BLOOD PRESSURE SUPPORT)
80.0000 ug | PREFILLED_SYRINGE | INTRAVENOUS | Status: DC | PRN
  Filled 2013-06-28: qty 2

## 2013-06-28 MED ORDER — ZOLPIDEM TARTRATE 5 MG PO TABS: 5.0000 mg | ORAL_TABLET | Freq: Every evening | ORAL | Status: DC | PRN

## 2013-06-28 MED ORDER — ONDANSETRON HCL 4 MG PO TABS: 4.0000 mg | ORAL_TABLET | ORAL | Status: DC | PRN

## 2013-06-28 MED ORDER — PRENATAL MULTIVITAMIN CH
1.0000 | ORAL_TABLET | Freq: Every day | ORAL | Status: DC
  Administered 2013-06-29: 1 via ORAL
  Filled 2013-06-28: qty 1

## 2013-06-28 MED ORDER — LIDOCAINE HCL (PF) 1 % IJ SOLN
30.0000 mL | INTRAMUSCULAR | Status: DC | PRN
  Filled 2013-06-28 (×2): qty 30

## 2013-06-28 MED ORDER — IBUPROFEN 600 MG PO TABS
600.0000 mg | ORAL_TABLET | Freq: Four times a day (QID) | ORAL | Status: DC
  Administered 2013-06-28 – 2013-06-30 (×4): 600 mg via ORAL
  Filled 2013-06-28 (×2): qty 1

## 2013-06-28 MED ORDER — OXYCODONE-ACETAMINOPHEN 5-325 MG PO TABS: 1.0000 | ORAL_TABLET | ORAL | Status: DC | PRN

## 2013-06-28 MED ORDER — CITRIC ACID-SODIUM CITRATE 334-500 MG/5ML PO SOLN: 30.0000 mL | ORAL | Status: DC | PRN

## 2013-06-28 MED ORDER — OXYCODONE-ACETAMINOPHEN 5-325 MG PO TABS
1.0000 | ORAL_TABLET | ORAL | Status: DC | PRN
  Administered 2013-06-29 – 2013-06-30 (×2): 1 via ORAL
  Filled 2013-06-28: qty 1
  Filled 2013-06-28: qty 2

## 2013-06-28 MED ORDER — EPHEDRINE 5 MG/ML INJ
10.0000 mg | INTRAVENOUS | Status: DC | PRN
  Filled 2013-06-28: qty 2

## 2013-06-28 MED ORDER — OXYTOCIN 40 UNITS IN LACTATED RINGERS INFUSION - SIMPLE MED
62.5000 mL/h | INTRAVENOUS | Status: DC
  Administered 2013-06-28: 62.5 mL/h via INTRAVENOUS
  Filled 2013-06-28 (×2): qty 1000

## 2013-06-28 MED ORDER — PHENYLEPHRINE 40 MCG/ML (10ML) SYRINGE FOR IV PUSH (FOR BLOOD PRESSURE SUPPORT)
80.0000 ug | PREFILLED_SYRINGE | INTRAVENOUS | Status: DC | PRN
  Filled 2013-06-28: qty 2
  Filled 2013-06-28: qty 5

## 2013-06-28 MED ORDER — SIMETHICONE 80 MG PO CHEW: 80.0000 mg | CHEWABLE_TABLET | ORAL | Status: DC | PRN

## 2013-06-28 NOTE — H&P (Signed)
This is Dr. Francoise Ceo dictating the history and physical on  Jaime Mendoza she's a 22 year old gravida 5 para 2113 at 38 weeks and 4 days EDC 07/08/2013 positive GBS and not penicillin she was admitted in labor cervix 5 cm she's no her rhythm monitor 0 station amniotomy performed the fluids clear she got her epidural Past medical history negative Past surgical history negative Social history negative System review negative Physical exam well-developed female in labor HEENT negative Lungs clear to P&A Breasts negative Heart regular rhythm no murmurs no gallops Abdomen term Pelvic as described above Extremities negative

## 2013-06-28 NOTE — H&P (Signed)
Addendum to history patient also has a history of herpes and was started on Valtrex 500 mg by mouth at 35 weeks she has had no  outbreaks

## 2013-06-28 NOTE — MAU Note (Signed)
Pt in c/o ucs since 0700.  States they are 2-3 minutes apart.  Denies any leaking of fluid. + FM.

## 2013-06-28 NOTE — Anesthesia Preprocedure Evaluation (Addendum)
Anesthesia Evaluation  Patient identified by MRN, date of birth, ID band Patient awake    Reviewed: Allergy & Precautions, H&P , NPO status , Patient's Chart, lab work & pertinent test results, reviewed documented beta blocker date and time   History of Anesthesia Complications Negative for: history of anesthetic complications  Airway Mallampati: II TM Distance: >3 FB Neck ROM: full    Dental  (+) Teeth Intact   Pulmonary asthma (last used inhaler in many months , usually uses once a week) , Current Smoker (1/4 ppd),  breath sounds clear to auscultation        Cardiovascular negative cardio ROS  Rhythm:regular Rate:Normal     Neuro/Psych negative neurological ROS  negative psych ROS   GI/Hepatic negative GI ROS, Neg liver ROS,   Endo/Other  negative endocrine ROS  Renal/GU negative Renal ROS  negative genitourinary   Musculoskeletal   Abdominal   Peds  Hematology negative hematology ROS (+)   Anesthesia Other Findings Repeat Plts are stable; No hx of PIH  Reproductive/Obstetrics (+) Pregnancy                          Anesthesia Physical  Anesthesia Plan  ASA: II  Anesthesia Plan: Epidural   Post-op Pain Management:    Induction:   Airway Management Planned:   Additional Equipment:   Intra-op Plan:   Post-operative Plan:   Informed Consent: I have reviewed the patients History and Physical, chart, labs and discussed the procedure including the risks, benefits and alternatives for the proposed anesthesia with the patient or authorized representative who has indicated his/her understanding and acceptance.     Plan Discussed with:   Anesthesia Plan Comments:         Anesthesia Quick Evaluation

## 2013-06-28 NOTE — MAU Note (Signed)
Contractions since 0630, now every 2-68minutes. No leaking, lost plug this morning, was blood streaked.

## 2013-06-28 NOTE — Anesthesia Procedure Notes (Signed)

## 2013-06-29 LAB — CBC
HCT: 24 % — ABNORMAL LOW (ref 36.0–46.0)
Hemoglobin: 8.3 g/dL — ABNORMAL LOW (ref 12.0–15.0)
MCH: 30.5 pg (ref 26.0–34.0)
MCV: 88.2 fL (ref 78.0–100.0)
Platelets: 97 10*3/uL — ABNORMAL LOW (ref 150–400)
RBC: 2.72 MIL/uL — ABNORMAL LOW (ref 3.87–5.11)
WBC: 10.7 10*3/uL — ABNORMAL HIGH (ref 4.0–10.5)

## 2013-06-29 NOTE — Progress Notes (Signed)
Patient ID: Jaime Mendoza, female   DOB: 06/15/1991, 21 y.o.   MRN: [id number] Postpartum day one Vital signs normal Fundus firm Lochia moderate Legs negative Doing well

## 2013-06-29 NOTE — Anesthesia Postprocedure Evaluation (Signed)
  Anesthesia Post-op Note  Patient: Jaime Mendoza  Procedure(s) Performed: * No procedures listed *  Patient Location: PACU and Mother/Baby  Anesthesia Type:Epidural  Level of Consciousness: awake, alert  and oriented  Airway and Oxygen Therapy: Patient Spontanous Breathing  Post-op Pain: none  Post-op Assessment: Post-op Vital signs reviewed, Patient's Cardiovascular Status Stable, No headache, No backache, No residual numbness and No residual motor weakness  Post-op Vital Signs: Reviewed and stable  Complications: No apparent anesthesia complications

## 2013-06-30 NOTE — Progress Notes (Signed)
Patient ID: Jaime Mendoza, female   DOB: 1991/02/23, 22 y.o.   MRN: [id number] Postpartum day 2 Vital signs normal Fundus firm Lochia moderate Doing well discharge today

## 2013-06-30 NOTE — Progress Notes (Signed)
Ur chart review completed.  

## 2013-06-30 NOTE — Discharge Summary (Signed)
Obstetric Discharge Summary Reason for Admission: onset of labor Prenatal Procedures: none Intrapartum Procedures: spontaneous vaginal delivery Postpartum Procedures: none Complications-Operative and Postpartum: none Hemoglobin  Date Value Range Status  06/29/2013 8.3* 12.0 - 15.0 g/dL Final     DELTA CHECK NOTED     REPEATED TO VERIFY     HCT  Date Value Range Status  06/29/2013 24.0* 36.0 - 46.0 % Final    Physical Exam:  General: alert Lochia: appropriate Uterine Fundus: firm Incision: healing well DVT Evaluation: No evidence of DVT seen on physical exam.  Discharge Diagnoses: Term Pregnancy-delivered  Discharge Information: Date: 06/30/2013 Activity: pelvic rest Diet: routine Medications: Percocet Condition: stable Instructions: refer to practice specific booklet Discharge to: home Follow-up Information   Follow up with MARSHALL,BERNARD A, MD. Schedule an appointment as soon as possible for a visit in 6 weeks.   Contact information:   [address] [place] [address] [phone]       Newborn Data: Live born female  Birth Weight: 6 lb 13.7 oz (3110 g) APGAR: 8, 9  Home with mother.  MARSHALL,BERNARD A 06/30/2013, 5:12 AM

## 2013-09-12 ENCOUNTER — Other Ambulatory Visit: Payer: Self-pay | Admitting: Obstetrics

## 2013-09-17 ENCOUNTER — Encounter (HOSPITAL_COMMUNITY): Payer: Self-pay | Admitting: Pharmacist

## 2013-10-01 ENCOUNTER — Encounter (HOSPITAL_COMMUNITY): Admission: RE | Payer: Self-pay | Source: Ambulatory Visit

## 2013-10-01 ENCOUNTER — Ambulatory Visit (HOSPITAL_COMMUNITY): Admission: RE | Admit: 2013-10-01 | Payer: Medicaid Other | Source: Ambulatory Visit | Admitting: Obstetrics

## 2013-10-01 SURGERY — LIGATION, FALLOPIAN TUBE, LAPAROSCOPIC
Anesthesia: General | Laterality: Bilateral

## 2013-10-13 ENCOUNTER — Emergency Department (HOSPITAL_COMMUNITY)
Admission: EM | Admit: 2013-10-13 | Discharge: 2013-10-13 | Disposition: A | Discharge status: Home / Self Care | Payer: No Typology Code available for payment source | Attending: Emergency Medicine | Admitting: Emergency Medicine

## 2013-10-13 ENCOUNTER — Encounter (HOSPITAL_COMMUNITY): Payer: Self-pay | Admitting: Emergency Medicine

## 2013-10-13 DIAGNOSIS — J45909 Unspecified asthma, uncomplicated: Secondary | ICD-10-CM | POA: Insufficient documentation

## 2013-10-13 DIAGNOSIS — S0993XA Unspecified injury of face, initial encounter: Secondary | ICD-10-CM | POA: Insufficient documentation

## 2013-10-13 DIAGNOSIS — F17200 Nicotine dependence, unspecified, uncomplicated: Secondary | ICD-10-CM | POA: Insufficient documentation

## 2013-10-13 DIAGNOSIS — Z8619 Personal history of other infectious and parasitic diseases: Secondary | ICD-10-CM | POA: Insufficient documentation

## 2013-10-13 DIAGNOSIS — Z87440 Personal history of urinary (tract) infections: Secondary | ICD-10-CM | POA: Insufficient documentation

## 2013-10-13 DIAGNOSIS — S0990XA Unspecified injury of head, initial encounter: Secondary | ICD-10-CM | POA: Insufficient documentation

## 2013-10-13 DIAGNOSIS — S298XXA Other specified injuries of thorax, initial encounter: Secondary | ICD-10-CM | POA: Insufficient documentation

## 2013-10-13 DIAGNOSIS — Z8751 Personal history of pre-term labor: Secondary | ICD-10-CM | POA: Insufficient documentation

## 2013-10-13 MED ORDER — OXYCODONE-ACETAMINOPHEN 5-325 MG PO TABS
1.0000 | ORAL_TABLET | Freq: Once | ORAL | Status: AC
  Administered 2013-10-13: 1 via ORAL
  Filled 2013-10-13: qty 1

## 2013-10-13 MED ORDER — IBUPROFEN 200 MG PO TABS: 200.0000 mg | ORAL_TABLET | Freq: Four times a day (QID) | ORAL | Status: DC | PRN

## 2013-10-13 MED ORDER — OXYCODONE-ACETAMINOPHEN 5-325 MG PO TABS: 1.0000 | ORAL_TABLET | Freq: Four times a day (QID) | ORAL | Status: DC | PRN
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
## 2013-10-13 NOTE — ED Notes (Signed)
Manufacturing systems engineer here to see Pt.

## 2013-10-13 NOTE — ED Provider Notes (Signed)
Medical screening examination/treatment/procedure(s) were performed by non-physician practitioner and as supervising physician I was immediately available for consultation/collaboration.    Lliam Hoh D Lamir Racca, MD 10/13/13 0359 

## 2013-11-16 ENCOUNTER — Emergency Department (INDEPENDENT_AMBULATORY_CARE_PROVIDER_SITE_OTHER)
Admission: EM | Admit: 2013-11-16 | Discharge: 2013-11-16 | Disposition: A | Discharge status: Home / Self Care | Payer: Self-pay | Source: Home / Self Care | Attending: Emergency Medicine | Admitting: Emergency Medicine

## 2013-11-16 ENCOUNTER — Encounter (HOSPITAL_COMMUNITY): Payer: Self-pay | Admitting: Emergency Medicine

## 2013-11-16 ENCOUNTER — Emergency Department (INDEPENDENT_AMBULATORY_CARE_PROVIDER_SITE_OTHER): Payer: Self-pay

## 2013-11-16 DIAGNOSIS — K047 Periapical abscess without sinus: Secondary | ICD-10-CM

## 2013-11-16 DIAGNOSIS — J069 Acute upper respiratory infection, unspecified: Secondary | ICD-10-CM

## 2013-11-16 LAB — POCT URINALYSIS DIP (DEVICE)
Leukocytes, UA: NEGATIVE
Protein, ur: >=300 mg/dL — AB
Urobilinogen, UA: 1 mg/dL (ref 0.0–1.0)
pH: 6 (ref 5.0–8.0)

## 2013-11-16 LAB — CBC WITH DIFFERENTIAL/PLATELET
Basophils Relative: 1 % (ref 0–1)
Eosinophils Relative: 0 % (ref 0–5)
HCT: 43.7 % (ref 36.0–46.0)
Hemoglobin: 14.3 g/dL (ref 12.0–15.0)
MCH: 28 pg (ref 26.0–34.0)
MCHC: 32.7 g/dL (ref 30.0–36.0)
MCV: 85.5 fL (ref 78.0–100.0)
Monocytes Absolute: 0.4 10*3/uL (ref 0.1–1.0)
Monocytes Relative: 10 % (ref 3–12)
Neutro Abs: 2.3 10*3/uL (ref 1.7–7.7)
RDW: 13.2 % (ref 11.5–15.5)

## 2013-11-16 LAB — POCT I-STAT, CHEM 8
Calcium, Ion: 1.1 mmol/L — ABNORMAL LOW (ref 1.12–1.23)
Hemoglobin: 16.7 g/dL — ABNORMAL HIGH (ref 12.0–15.0)
Sodium: 139 mEq/L (ref 135–145)
TCO2: 26 mmol/L (ref 0–100)

## 2013-11-16 LAB — POCT PREGNANCY, URINE: Preg Test, Ur: NEGATIVE

## 2013-11-16 MED ORDER — HYDROCODONE-ACETAMINOPHEN 5-325 MG PO TABS
2.0000 | ORAL_TABLET | Freq: Once | ORAL | Status: AC
  Administered 2013-11-16: 2 via ORAL

## 2013-11-16 MED ORDER — HYDROCODONE-ACETAMINOPHEN 5-325 MG PO TABS
ORAL_TABLET | ORAL | Status: AC
  Filled 2013-11-16: qty 2

## 2013-11-16 MED ORDER — METRONIDAZOLE 500 MG PO TABS: 500.0000 mg | ORAL_TABLET | Freq: Three times a day (TID) | ORAL | Status: DC

## 2013-11-16 MED ORDER — HYDROCODONE-ACETAMINOPHEN 5-325 MG PO TABS: ORAL_TABLET | ORAL | Status: DC

## 2013-11-16 MED ORDER — GUAIFENESIN-CODEINE 100-10 MG/5ML PO SYRP: 10.0000 mL | ORAL_SOLUTION | Freq: Four times a day (QID) | ORAL | Status: DC | PRN

## 2013-11-16 MED ORDER — AMOXICILLIN 500 MG PO CAPS: 500.0000 mg | ORAL_CAPSULE | Freq: Three times a day (TID) | ORAL | Status: DC

## 2013-11-16 NOTE — ED Notes (Signed)
C/o cold sx States she has headache, coughing, sneezing, runny nose and hot/cold chills Tylenol cold and flu was taken but no relief.  States back and leg feels achy Denies diarrhea and vomiting

## 2013-11-16 NOTE — ED Provider Notes (Signed)
Chief Complaint:   Chief Complaint  Patient presents with  . Cough  . Nasal Congestion    History of Present Illness:   Jaime Mendoza is a 22 year old female who has had a two-day history of a headache, abdominal and back pain, soreness, aching, weakness in her legs, sweats, and chills. She's also had a cough which has been dry at times and sometimes productive of green or bloody sputum with and some difficulty breathing. She's had rhinorrhea with clear drainage, scratchy throat, and burning of her throat. She denies any fever, wheezing, nausea or vomiting, diarrhea, or urinary symptoms. She denies any dental pain or swelling.  Review of Systems:  Other than noted above, the patient denies any of the following symptoms: Systemic:  No fevers, chills, sweats, weight loss or gain, fatigue, or tiredness. Eye:  No redness or discharge. ENT:  No ear pain, drainage, headache, nasal congestion, drainage, sinus pressure, difficulty swallowing, or sore throat. Neck:  No neck pain or swollen glands. Lungs:  No cough, sputum production, hemoptysis, wheezing, chest tightness, shortness of breath or chest pain. GI:  No abdominal pain, nausea, vomiting or diarrhea.  PMFSH:  Past medical history, family history, social history, meds, and allergies were reviewed.  Physical Exam:   Vital signs:  BP 100/68  Pulse 77  Temp(Src) 98.4 F (36.9 C) (Oral)  Resp 19  SpO2 100%  LMP 11/01/2013 General:  Alert and oriented.  In no distress.  Skin warm and dry. Eye:  No conjunctival injection or drainage. Lids were normal. ENT:  TMs and canals were normal, without erythema or inflammation.  Nasal mucosa was clear and uncongested, without drainage.  Mucous membranes were moist.  Pharynx was clear with no exudate or drainage.  There was swelling and tenderness to palpation lung the right side of the jaw. Dental examination showed a very decayed right, lower first molar with what appears to be an abscess at the base  of the tooth. Neck:  Supple, no adenopathy, tenderness or mass. Lungs:  No respiratory distress.  Lungs were clear to auscultation, without wheezes, rales or rhonchi.  Breath sounds were clear and equal bilaterally.  Heart:  Regular rhythm, without gallops, murmers or rubs. Skin:  Clear, warm, and dry, without rash or lesions.  Labs:   Results for orders placed during the hospital encounter of 11/16/13  CBC WITH DIFFERENTIAL      Result Value Range   WBC 4.1  4.0 - 10.5 K/uL   RBC 5.11  3.87 - 5.11 MIL/uL   Hemoglobin 14.3  12.0 - 15.0 g/dL   HCT 81.1  91.4 - 78.2 %   MCV 85.5  78.0 - 100.0 fL   MCH 28.0  26.0 - 34.0 pg   MCHC 32.7  30.0 - 36.0 g/dL   RDW 95.6  21.3 - 08.6 %   Platelets 139 (*) 150 - 400 K/uL   Neutrophils Relative % 55  43 - 77 %   Neutro Abs 2.3  1.7 - 7.7 K/uL   Lymphocytes Relative 34  12 - 46 %   Lymphs Abs 1.4  0.7 - 4.0 K/uL   Monocytes Relative 10  3 - 12 %   Monocytes Absolute 0.4  0.1 - 1.0 K/uL   Eosinophils Relative 0  0 - 5 %   Eosinophils Absolute 0.0  0.0 - 0.7 K/uL   Basophils Relative 1  0 - 1 %   Basophils Absolute 0.0  0.0 - 0.1 K/uL  POCT  URINALYSIS DIP (DEVICE)      Result Value Range   Glucose, UA NEGATIVE  NEGATIVE mg/dL   Bilirubin Urine MODERATE (*) NEGATIVE   Ketones, ur TRACE (*) NEGATIVE mg/dL   Specific Gravity, Urine >=1.030  1.005 - 1.030   Hgb urine dipstick SMALL (*) NEGATIVE   pH 6.0  5.0 - 8.0   Protein, ur >=300 (*) NEGATIVE mg/dL   Urobilinogen, UA 1.0  0.0 - 1.0 mg/dL   Nitrite NEGATIVE  NEGATIVE   Leukocytes, UA NEGATIVE  NEGATIVE  POCT PREGNANCY, URINE      Result Value Range   Preg Test, Ur NEGATIVE  NEGATIVE  POCT I-STAT, CHEM 8      Result Value Range   Sodium 139  135 - 145 mEq/L   Potassium 4.0  3.5 - 5.1 mEq/L   Chloride 101  96 - 112 mEq/L   BUN 12  6 - 23 mg/dL   Creatinine, Ser 1.61 (*) 0.50 - 1.10 mg/dL   Glucose, Bld 89  70 - 99 mg/dL   Calcium, Ion 0.96 (*) 1.12 - 1.23 mmol/L   TCO2 26  0 - 100  mmol/L   Hemoglobin 16.7 (*) 12.0 - 15.0 g/dL   HCT 04.5 (*) 40.9 - 81.1 %     Radiology:  Dg Chest 2 View  11/16/2013   CLINICAL DATA:  Cough and weakness  EXAM: CHEST  2 VIEW  COMPARISON:  04/05/2008  FINDINGS: Cardiac shadow is within normal limits. The lungs are hyperinflated bilaterally. No focal infiltrate is seen. No acute bony abnormality is noted.  IMPRESSION: No active cardiopulmonary disease.   Electronically Signed   By: Alcide Clever M.D.   On: 11/16/2013 11:05    Course in Urgent Care Center:   She was given Norco 5/325 2 for pain with good results.  Assessment:  The primary encounter diagnosis was Dental abscess. A diagnosis of Viral upper respiratory infection was also pertinent to this visit.  Plan:   1.  Meds:  The following meds were prescribed:   Discharge Medication List as of 11/16/2013 11:43 AM    START taking these medications   Details  amoxicillin (AMOXIL) 500 MG capsule Take 1 capsule (500 mg total) by mouth 3 (three) times daily., Starting 11/16/2013, Until Discontinued, Normal    guaiFENesin-codeine (GUIATUSS AC) 100-10 MG/5ML syrup Take 10 mLs by mouth 4 (four) times daily as needed for cough., Starting 11/16/2013, Until Discontinued, Print    HYDROcodone-acetaminophen (NORCO/VICODIN) 5-325 MG per tablet 1 to 2 tabs every 4 to 6 hours as needed for pain., Print    metroNIDAZOLE (FLAGYL) 500 MG tablet Take 1 tablet (500 mg total) by mouth 3 (three) times daily., Starting 11/16/2013, Until Discontinued, Normal        2.  Patient Education/Counseling:  The patient was given appropriate handouts, self care instructions, and instructed in symptomatic relief.  Advised rest and fluids. Suggested hot salt water mouthwashes, and she'll need to see a dentist as soon as possible.  3.  Follow up:  The patient was told to follow up if no better in 3 to 4 days, if becoming worse in any way, and given some red flag symptoms such as any difficulty breathing which would  prompt immediate return.  Follow up here for any respiratory issues and with a dentist for her abscessed tooth.      Reuben Likes, MD 11/16/13 [phone]

## 2014-02-02 ENCOUNTER — Encounter (HOSPITAL_COMMUNITY): Payer: Self-pay

## 2014-02-02 ENCOUNTER — Inpatient Hospital Stay (HOSPITAL_COMMUNITY)
Admission: AD | Admit: 2014-02-02 | Discharge: 2014-02-02 | Disposition: A | Discharge status: Home / Self Care | Payer: Medicaid Other | Source: Ambulatory Visit | Attending: Obstetrics | Admitting: Obstetrics

## 2014-02-02 DIAGNOSIS — O209 Hemorrhage in early pregnancy, unspecified: Secondary | ICD-10-CM | POA: Insufficient documentation

## 2014-02-02 DIAGNOSIS — A499 Bacterial infection, unspecified: Secondary | ICD-10-CM | POA: Insufficient documentation

## 2014-02-02 DIAGNOSIS — B9689 Other specified bacterial agents as the cause of diseases classified elsewhere: Secondary | ICD-10-CM | POA: Insufficient documentation

## 2014-02-02 DIAGNOSIS — N760 Acute vaginitis: Secondary | ICD-10-CM | POA: Insufficient documentation

## 2014-02-02 DIAGNOSIS — O2390 Unspecified genitourinary tract infection in pregnancy, unspecified trimester: Secondary | ICD-10-CM | POA: Insufficient documentation

## 2014-02-02 DIAGNOSIS — O4690 Antepartum hemorrhage, unspecified, unspecified trimester: Secondary | ICD-10-CM

## 2014-02-02 DIAGNOSIS — O99330 Smoking (tobacco) complicating pregnancy, unspecified trimester: Secondary | ICD-10-CM | POA: Insufficient documentation

## 2014-02-02 DIAGNOSIS — R109 Unspecified abdominal pain: Secondary | ICD-10-CM | POA: Insufficient documentation

## 2014-02-02 DIAGNOSIS — A5609 Other chlamydial infection of lower genitourinary tract: Secondary | ICD-10-CM

## 2014-02-02 LAB — URINALYSIS, ROUTINE W REFLEX MICROSCOPIC
Bilirubin Urine: NEGATIVE
Glucose, UA: NEGATIVE mg/dL
Ketones, ur: NEGATIVE mg/dL
Leukocytes, UA: NEGATIVE
NITRITE: NEGATIVE
Protein, ur: NEGATIVE mg/dL
SPECIFIC GRAVITY, URINE: 1.025 (ref 1.005–1.030)
Urobilinogen, UA: 0.2 mg/dL (ref 0.0–1.0)
pH: 6 (ref 5.0–8.0)

## 2014-02-02 LAB — CBC
HCT: 30.4 % — ABNORMAL LOW (ref 36.0–46.0)
HEMOGLOBIN: 10.3 g/dL — AB (ref 12.0–15.0)
MCH: 29.3 pg (ref 26.0–34.0)
MCHC: 33.9 g/dL (ref 30.0–36.0)
MCV: 86.4 fL (ref 78.0–100.0)
Platelets: 143 10*3/uL — ABNORMAL LOW (ref 150–400)
RBC: 3.52 MIL/uL — ABNORMAL LOW (ref 3.87–5.11)
RDW: 14 % (ref 11.5–15.5)
WBC: 6.2 10*3/uL (ref 4.0–10.5)

## 2014-02-02 LAB — WET PREP, GENITAL
Trich, Wet Prep: NONE SEEN
Yeast Wet Prep HPF POC: NONE SEEN

## 2014-02-02 LAB — URINE MICROSCOPIC-ADD ON

## 2014-02-02 LAB — POCT PREGNANCY, URINE: PREG TEST UR: POSITIVE — AB

## 2014-02-02 MED ORDER — METRONIDAZOLE 500 MG PO TABS: 500.0000 mg | ORAL_TABLET | Freq: Two times a day (BID) | ORAL | Status: DC

## 2014-02-02 NOTE — MAU Provider Note (Signed)
History     CSN: [id number]  Arrival date and time: 02/02/14 [id number]   None     Chief Complaint  Patient presents with  . Possible Pregnancy  . Vaginal Bleeding  . Abdominal Pain   HPI  Ms. Jaime Mendoza is a 23 y.o. female [phone] at 668w3d who presents with vaginal bleeding and abdominal pain. Pt has an appointment today to terminate this pregnancy, however began bleeding yesterday and figured she would come here first to determine if the baby was alive. She complains of lower abdominal pain that comes and goes.  No intercourse recently.   OB History   Grav Para Term Preterm Abortions TAB SAB Ect Mult Living   6 4 3 1 1  0 1 0 0 4      Past Medical History  Diagnosis Date  . Urinary tract infection   . Chlamydia   . Preterm labor   . No pertinent past medical history   . UTI in pregnancy   . Asthma   . HSV-2 infection complicating pregnancy   . Normal pregnancy, repeat 01/30/2012  . SVD (spontaneous vaginal delivery) 01/30/2012    Past Surgical History  Procedure Laterality Date  . No past surgeries      Family History  Problem Relation Age of Onset  . Anesthesia problems Neg Hx   . Hypotension Neg Hx   . Malignant hyperthermia Neg Hx   . Pseudochol deficiency Neg Hx   . Cancer Father     colon  . Seizures Sister   . Diabetes Maternal Grandmother   . Heart disease Maternal Grandfather   . Heart disease Paternal Grandfather     History  Substance Use Topics  . Smoking status: Current Every Day Smoker -- 0.25 packs/day for 5 years    Types: Cigarettes  . Smokeless tobacco: Never Used  . Alcohol Use: Yes     Comment: occasional -  NOT WHILE PREG    Allergies: No Known Allergies  Prescriptions prior to admission  Medication Sig Dispense Refill  . amoxicillin (AMOXIL) 500 MG capsule Take 1 capsule (500 mg total) by mouth 3 (three) times daily.  30 capsule  0  . guaiFENesin-codeine (GUIATUSS AC) 100-10 MG/5ML syrup Take 10 mLs by mouth 4 (four) times  daily as needed for cough.  120 mL  0  . HYDROcodone-acetaminophen (NORCO/VICODIN) 5-325 MG per tablet 1 to 2 tabs every 4 to 6 hours as needed for pain.  20 tablet  0  . ibuprofen (ADVIL,MOTRIN) 200 MG tablet Take 1 tablet (200 mg total) by mouth every 6 (six) hours as needed.  30 tablet  0  . metroNIDAZOLE (FLAGYL) 500 MG tablet Take 1 tablet (500 mg total) by mouth 3 (three) times daily.  30 tablet  0  . oxyCODONE-acetaminophen (PERCOCET/ROXICET) 5-325 MG per tablet Take 1 tablet by mouth every 6 (six) hours as needed for severe pain.  20 tablet  0    Results for orders placed during the hospital encounter of 02/02/14 (from the past 48 hour(s))  CBC     Status: Abnormal   Collection Time    02/02/14  8:22 AM      Result Value Ref Range   WBC 6.2  4.0 - 10.5 K/uL   RBC 3.52 (*) 3.87 - 5.11 MIL/uL   Hemoglobin 10.3 (*) 12.0 - 15.0 g/dL   HCT 65.730.4 (*) 84.636.0 - 96.246.0 %   MCV 86.4  78.0 - 100.0 fL   MCH 29.3  26.0 - 34.0 pg   MCHC 33.9  30.0 - 36.0 g/dL   RDW 16.1  09.6 - 04.5 %   Platelets 143 (*) 150 - 400 K/uL  POCT PREGNANCY, URINE     Status: Abnormal   Collection Time    02/02/14  8:35 AM      Result Value Ref Range   Preg Test, Ur POSITIVE (*) NEGATIVE   Comment:            THE SENSITIVITY OF THIS     METHODOLOGY IS >24 mIU/mL    Review of Systems  Constitutional: Negative for fever and chills.  Gastrointestinal: Positive for abdominal pain (Bilateral lower abdominal cramping ). Negative for nausea, vomiting, diarrhea and constipation.  Genitourinary: Negative for dysuria, urgency, frequency and hematuria.       No vaginal discharge. + vaginal bleeding. No dysuria.    Physical Exam   Blood pressure 135/76, pulse 90, temperature 98.6 F (37 C), temperature source Oral, resp. rate 16, height 5' 9.5" (1.765 m), weight 56.337 kg (124 lb 3.2 oz), last menstrual period 10/31/2013, SpO2 100.00%.  Physical Exam  Constitutional: She is oriented to person, place, and time. She  appears well-developed and well-nourished. No distress.  HENT:  Head: Normocephalic.  Eyes: Pupils are equal, round, and reactive to light.  Neck: Neck supple.  GI: Soft. Normal appearance. There is tenderness in the right lower quadrant, suprapubic area and left lower quadrant. There is no rigidity and no guarding.  Genitourinary:  Speculum exam: Vagina - Small amount of creamy, pink discharge, no odor Cervix - No contact bleeding Bimanual exam: Cervix closed, no CMT  Uterus non tender, enlarged  Adnexa non tender, no masses bilaterally GC/Chlam, wet prep done Chaperone present for exam.   Musculoskeletal: Normal range of motion.  Neurological: She is alert and oriented to person, place, and time.  Skin: Skin is warm. She is not diaphoretic.  Psychiatric: Her behavior is normal.    MAU Course  Procedures None  MDM A positive blood type  +fht; 156 bpm via fetal doppler  A positive blood type  Pt plans to leave MAU and go to [place] to terminate this pregnancy Pt desires a tubal ligation and plans to call Dr. Elsie Stain office today to schedule an appointment. I encouraged patient to talk to the office about depo or birthcontrol pills if the tubal ligation could not be scheduled in a reasonable amount of time.   Assessment and Plan   A:  1. Vaginal bleeding in pregnancy   2. Bacterial vaginosis     P:  Discharge home in stable condition Return to MAU as needed Bleeding precautions discussed  RX: Flagyl    Iona Hansen Rasch, NP  02/02/2014, 4:26 PM

## 2014-02-02 NOTE — MAU Note (Signed)
Patient states she has not had a period since November, the end but not sure of date. States she has had spotting on and off thinking she was going to have a period then yesterday had a gush of bleeding. States she has been having abdominal pain since 2-28. Nausea, no vomiting.

## 2014-02-02 NOTE — Discharge Instructions (Signed)

## 2014-02-02 NOTE — MAU Provider Note (Signed)
Attestation of Attending Supervision of Advanced Practitioner (CNM/NP): Evaluation and management procedures were performed by the Advanced Practitioner under my supervision and collaboration.  I have reviewed the Advanced Practitioner's note and chart, and I agree with the management and plan.  HARRAWAY-SMITH, Cadel Stairs 4:30 PM

## 2014-02-03 ENCOUNTER — Telehealth: Payer: Self-pay

## 2014-02-03 DIAGNOSIS — A749 Chlamydial infection, unspecified: Secondary | ICD-10-CM

## 2014-02-03 LAB — GC/CHLAMYDIA PROBE AMP
CT Probe RNA: POSITIVE — AB
GC Probe RNA: NEGATIVE

## 2014-02-03 MED ORDER — AZITHROMYCIN 500 MG PO TABS: 1000.0000 mg | ORAL_TABLET | Freq: Every day | ORAL | Status: DC

## 2014-02-03 NOTE — Telephone Encounter (Signed)
Message copied by Louanna RawAMPBELL, Ashauna Bertholf M on Tue Feb 03, 2014  3:26 PM ------      Message from: Pennie BanterSMITH, MARNI W      Created: Tue Feb 03, 2014  8:52 AM       Telephone call to patient regarding positive chlamydia culture, patient notified.  Patient has not been treated and will need Rx called in per protocol to [facility] [address]  Instructed patient to notify her partner for treatment.  Report faxed to health department. ------

## 2014-02-03 NOTE — Telephone Encounter (Signed)
Called pt. And informed her of prescription for chlamydia treatment that has been sent to her [facility] pharmacy. Re-itterated the importance of having partner treated and abstaining from sex until both are treated. Pt. Verbalized understanding and had no questions or concerns.

## 2014-07-07 ENCOUNTER — Inpatient Hospital Stay (HOSPITAL_COMMUNITY)
Admission: AD | Admit: 2014-07-07 | Discharge: 2014-07-07 | Disposition: A | Discharge status: Home / Self Care | Payer: Medicaid Other | Source: Ambulatory Visit | Attending: Obstetrics & Gynecology | Admitting: Obstetrics & Gynecology

## 2014-07-07 ENCOUNTER — Encounter (HOSPITAL_COMMUNITY): Payer: Self-pay | Admitting: *Deleted

## 2014-07-07 DIAGNOSIS — B9689 Other specified bacterial agents as the cause of diseases classified elsewhere: Secondary | ICD-10-CM | POA: Diagnosis not present

## 2014-07-07 DIAGNOSIS — F3289 Other specified depressive episodes: Secondary | ICD-10-CM | POA: Insufficient documentation

## 2014-07-07 DIAGNOSIS — N938 Other specified abnormal uterine and vaginal bleeding: Secondary | ICD-10-CM | POA: Insufficient documentation

## 2014-07-07 DIAGNOSIS — A499 Bacterial infection, unspecified: Secondary | ICD-10-CM | POA: Insufficient documentation

## 2014-07-07 DIAGNOSIS — N760 Acute vaginitis: Secondary | ICD-10-CM | POA: Diagnosis not present

## 2014-07-07 DIAGNOSIS — N939 Abnormal uterine and vaginal bleeding, unspecified: Secondary | ICD-10-CM

## 2014-07-07 DIAGNOSIS — F17200 Nicotine dependence, unspecified, uncomplicated: Secondary | ICD-10-CM | POA: Diagnosis not present

## 2014-07-07 DIAGNOSIS — R109 Unspecified abdominal pain: Secondary | ICD-10-CM | POA: Insufficient documentation

## 2014-07-07 DIAGNOSIS — F329 Major depressive disorder, single episode, unspecified: Secondary | ICD-10-CM | POA: Insufficient documentation

## 2014-07-07 DIAGNOSIS — N949 Unspecified condition associated with female genital organs and menstrual cycle: Secondary | ICD-10-CM | POA: Diagnosis present

## 2014-07-07 DIAGNOSIS — N925 Other specified irregular menstruation: Secondary | ICD-10-CM | POA: Diagnosis present

## 2014-07-07 DIAGNOSIS — A6000 Herpesviral infection of urogenital system, unspecified: Secondary | ICD-10-CM | POA: Insufficient documentation

## 2014-07-07 DIAGNOSIS — N898 Other specified noninflammatory disorders of vagina: Secondary | ICD-10-CM

## 2014-07-07 LAB — URINALYSIS, ROUTINE W REFLEX MICROSCOPIC
Bilirubin Urine: NEGATIVE
GLUCOSE, UA: NEGATIVE mg/dL
KETONES UR: NEGATIVE mg/dL
LEUKOCYTES UA: NEGATIVE
Nitrite: NEGATIVE
PH: 6 (ref 5.0–8.0)
Protein, ur: NEGATIVE mg/dL
Specific Gravity, Urine: 1.025 (ref 1.005–1.030)
Urobilinogen, UA: 1 mg/dL (ref 0.0–1.0)

## 2014-07-07 LAB — CBC WITH DIFFERENTIAL/PLATELET
BASOS PCT: 0 % (ref 0–1)
Basophils Absolute: 0 10*3/uL (ref 0.0–0.1)
EOS ABS: 0.1 10*3/uL (ref 0.0–0.7)
Eosinophils Relative: 2 % (ref 0–5)
HCT: 32.9 % — ABNORMAL LOW (ref 36.0–46.0)
Hemoglobin: 10.8 g/dL — ABNORMAL LOW (ref 12.0–15.0)
Lymphocytes Relative: 39 % (ref 12–46)
Lymphs Abs: 1.9 10*3/uL (ref 0.7–4.0)
MCH: 28.5 pg (ref 26.0–34.0)
MCHC: 32.8 g/dL (ref 30.0–36.0)
MCV: 86.8 fL (ref 78.0–100.0)
Monocytes Absolute: 0.3 10*3/uL (ref 0.1–1.0)
Monocytes Relative: 7 % (ref 3–12)
NEUTROS PCT: 52 % (ref 43–77)
Neutro Abs: 2.6 10*3/uL (ref 1.7–7.7)
PLATELETS: 142 10*3/uL — AB (ref 150–400)
RBC: 3.79 MIL/uL — ABNORMAL LOW (ref 3.87–5.11)
RDW: 13.8 % (ref 11.5–15.5)
WBC: 4.9 10*3/uL (ref 4.0–10.5)

## 2014-07-07 LAB — URINE MICROSCOPIC-ADD ON

## 2014-07-07 LAB — BASIC METABOLIC PANEL
Anion gap: 9 (ref 5–15)
BUN: 10 mg/dL (ref 6–23)
CO2: 25 mEq/L (ref 19–32)
Calcium: 8.8 mg/dL (ref 8.4–10.5)
Chloride: 106 mEq/L (ref 96–112)
Creatinine, Ser: 0.9 mg/dL (ref 0.50–1.10)
GFR calc Af Amer: >90 mL/min (ref >90)
GFR, EST NON AFRICAN AMERICAN: 89 mL/min — AB (ref >90)
Glucose, Bld: 75 mg/dL (ref 70–99)
POTASSIUM: 3.9 meq/L (ref 3.7–5.3)
SODIUM: 140 meq/L (ref 137–147)

## 2014-07-07 LAB — POCT PREGNANCY, URINE: PREG TEST UR: NEGATIVE

## 2014-07-07 LAB — RPR

## 2014-07-07 LAB — HIV ANTIBODY (ROUTINE TESTING W REFLEX): HIV 1&2 Ab, 4th Generation: NONREACTIVE

## 2014-07-07 LAB — WET PREP, GENITAL
Trich, Wet Prep: NONE SEEN
Yeast Wet Prep HPF POC: NONE SEEN

## 2014-07-07 MED ORDER — MEDROXYPROGESTERONE ACETATE 150 MG/ML IM SUSP
150.0000 mg | Freq: Once | INTRAMUSCULAR | Status: AC
  Administered 2014-07-07: 150 mg via INTRAMUSCULAR
  Filled 2014-07-07: qty 1

## 2014-07-07 MED ORDER — METRONIDAZOLE 500 MG PO TABS: 500.0000 mg | ORAL_TABLET | Freq: Two times a day (BID) | ORAL | Status: DC

## 2014-07-07 MED ORDER — WOMENS MULTIVITAMIN PLUS PO TABS: 1.0000 | ORAL_TABLET | Freq: Every day | ORAL | Status: DC

## 2014-07-07 MED ORDER — VALACYCLOVIR HCL 500 MG PO TABS: 500.0000 mg | ORAL_TABLET | Freq: Every day | ORAL | Status: DC

## 2014-07-07 NOTE — MAU Provider Note (Signed)
CSN: [id number]     Arrival date & time 07/07/14  1259 History   None    Chief Complaint  Patient presents with  . Vaginal Bleeding  . Abdominal Pain     (Consider location/radiation/quality/duration/timing/severity/associated sxs/prior Treatment) Patient is a 23 y.o. female presenting with vaginal bleeding and abdominal pain. The history is provided by the patient.  Vaginal Bleeding This is a new problem. The current episode started more than 1 month ago. Associated symptoms include abdominal pain. Pertinent negatives include no chest pain, chills, coughing, fever, nausea, rash or vomiting.  Abdominal Pain The primary symptoms of the illness include abdominal pain, dysuria, vaginal discharge and vaginal bleeding. The primary symptoms of the illness do not include fever, shortness of breath, nausea or vomiting.  The dysuria is associated with urgency.  The vaginal discharge is associated with dysuria.   Additional symptoms associated with the illness include urgency. Symptoms associated with the illness do not include chills or back pain.   Jaime Mendoza is a 23 y.o. female who presents to the MAU with vaginal bleeding. LNMP 06/28/2014. After she stopped bleeding a few days later she started bleeding again. She has continued to have irregular bleeding and pain since then. She states she had a EAB in Feb. 2015 and since then she has had weight loss decreased appetite, weakness and recently feeling dizzy. She complains of feeling depressed. She uses no birthcontrol. She has used Depo Provera in the past and it worked well. She has bilateral flank pain. She does not drink water only soda. She is having urgency and dysuria. Current sex partner x several years.  Hx of BV and Chlamydia and HSV. She has been on Valtrex in the past but ran out and did not have insurance to refill for suppressant therapy.    Past Medical History  Diagnosis Date  . Urinary tract infection   . Chlamydia   . Preterm  labor   . No pertinent past medical history   . UTI in pregnancy   . Asthma   . HSV-2 infection complicating pregnancy   . Normal pregnancy, repeat 01/30/2012  . SVD (spontaneous vaginal delivery) 01/30/2012   Past Surgical History  Procedure Laterality Date  . No past surgeries     Family History  Problem Relation Age of Onset  . Anesthesia problems Neg Hx   . Hypotension Neg Hx   . Malignant hyperthermia Neg Hx   . Pseudochol deficiency Neg Hx   . Cancer Father     colon  . Seizures Sister   . Diabetes Maternal Grandmother   . Heart disease Maternal Grandfather   . Heart disease Paternal Grandfather    History  Substance Use Topics  . Smoking status: Current Every Day Smoker -- 0.50 packs/day for 5 years    Types: Cigarettes  . Smokeless tobacco: Never Used  . Alcohol Use: Yes     Comment: occasional -  NOT WHILE PREG   OB History   Grav Para Term Preterm Abortions TAB SAB Ect Mult Living   6 4 3 1 1  0 1 0 0 4     Review of Systems  Constitutional: Negative for fever and chills.  HENT: Negative.   Eyes: Negative for visual disturbance.  Respiratory: Negative for cough, shortness of breath and wheezing.   Cardiovascular: Negative for chest pain.  Gastrointestinal: Positive for abdominal pain. Negative for nausea and vomiting.  Genitourinary: Positive for dysuria, urgency, flank pain, vaginal bleeding and vaginal discharge.  Musculoskeletal: Negative for back pain.  Skin: Negative for rash.  Neurological: Positive for light-headedness. Negative for syncope.  Psychiatric/Behavioral: Negative for confusion. Nervous/anxious: "felt depressed"       Allergies  Review of patient's allergies indicates no known allergies.  Home Medications   Prior to Admission medications   Not on File   BP 110/69  Pulse 77  Temp(Src) 98.6 F (37 C) (Oral)  Resp 18  Ht 5\' 9"  (1.753 m)  Wt 110 lb 3.2 oz (49.986 kg)  BMI 16.27 kg/m2  LMP 06/28/2014  Breastfeeding?  No Physical Exam  Nursing note and vitals reviewed. Constitutional: She is oriented to person, place, and time. She appears well-developed and well-nourished. No distress.  HENT:  Head: Normocephalic.  Eyes: EOM are normal.  Neck: Neck supple.  Cardiovascular: Normal rate and regular rhythm.   Pulmonary/Chest: Effort normal and breath sounds normal.  Abdominal: Soft. There is no tenderness. There is no CVA tenderness.  Unable to reproduce the pain the patient has been having.   Genitourinary:  External genitalia without lesions, dark blood vaginal vault, small amount. No CMT, no adnexal tenderness, uterus without palpable enlargement.   Musculoskeletal: Normal range of motion.  Neurological: She is alert and oriented to person, place, and time. No cranial nerve deficit.  Skin: Skin is warm and dry.  Psychiatric: She has a normal mood and affect. Her behavior is normal.    ED Course  Procedures  Results for orders placed during the hospital encounter of 07/07/14 (from the past 24 hour(s))  URINALYSIS, ROUTINE W REFLEX MICROSCOPIC     Status: Abnormal   Collection Time    07/07/14  1:15 PM      Result Value Ref Range   Color, Urine YELLOW  YELLOW   APPearance CLEAR  CLEAR   Specific Gravity, Urine 1.025  1.005 - 1.030   pH 6.0  5.0 - 8.0   Glucose, UA NEGATIVE  NEGATIVE mg/dL   Hgb urine dipstick SMALL (*) NEGATIVE   Bilirubin Urine NEGATIVE  NEGATIVE   Ketones, ur NEGATIVE  NEGATIVE mg/dL   Protein, ur NEGATIVE  NEGATIVE mg/dL   Urobilinogen, UA 1.0  0.0 - 1.0 mg/dL   Nitrite NEGATIVE  NEGATIVE   Leukocytes, UA NEGATIVE  NEGATIVE  URINE MICROSCOPIC-ADD ON     Status: Abnormal   Collection Time    07/07/14  1:15 PM      Result Value Ref Range   Squamous Epithelial / LPF MANY (*) RARE   WBC, UA 3-6  <3 WBC/hpf   RBC / HPF 0-2  <3 RBC/hpf   Bacteria, UA RARE  RARE  POCT PREGNANCY, URINE     Status: None   Collection Time    07/07/14  1:47 PM      Result Value Ref Range    Preg Test, Ur NEGATIVE  NEGATIVE  CBC WITH DIFFERENTIAL     Status: Abnormal   Collection Time    07/07/14  2:18 PM      Result Value Ref Range   WBC 4.9  4.0 - 10.5 K/uL   RBC 3.79 (*) 3.87 - 5.11 MIL/uL   Hemoglobin 10.8 (*) 12.0 - 15.0 g/dL   HCT 96.0 (*) 45.4 - 09.8 %   MCV 86.8  78.0 - 100.0 fL   MCH 28.5  26.0 - 34.0 pg   MCHC 32.8  30.0 - 36.0 g/dL   RDW 11.9  14.7 - 82.9 %   Platelets 142 (*) 150 -  400 K/uL   Neutrophils Relative % 52  43 - 77 %   Neutro Abs 2.6  1.7 - 7.7 K/uL   Lymphocytes Relative 39  12 - 46 %   Lymphs Abs 1.9  0.7 - 4.0 K/uL   Monocytes Relative 7  3 - 12 %   Monocytes Absolute 0.3  0.1 - 1.0 K/uL   Eosinophils Relative 2  0 - 5 %   Eosinophils Absolute 0.1  0.0 - 0.7 K/uL   Basophils Relative 0  0 - 1 %   Basophils Absolute 0.0  0.0 - 0.1 K/uL  WET PREP, GENITAL     Status: Abnormal   Collection Time    07/07/14  2:20 PM      Result Value Ref Range   Yeast Wet Prep HPF POC NONE SEEN  NONE SEEN   Trich, Wet Prep NONE SEEN  NONE SEEN   Clue Cells Wet Prep HPF POC MODERATE (*) NONE SEEN   WBC, Wet Prep HPF POC FEW (*) NONE SEEN     MDM  23 y.o. female with abnormal vaginal bleeding, discharge with odor and abdominal cramping for over 2 weeks. Also requesting referral for feeling depresses since having an abortion 7 months ago. She also request vitamin with Fe and Valtrex that she has run out of for suppression therapy for HSV. Stable for discharge without sever anemia. She has no S/I or H/I. Will treat for BV, will refill Valtrex and give referral for follow up with a support group. She will follow up in the GYN Clinic for Pap smear and yearly physical. She will return here as needed.  Discussed with the patient and all questioned fully answered.   Medication List         metroNIDAZOLE 500 MG tablet  Commonly known as:  FLAGYL  Take 1 tablet (500 mg total) by mouth 2 (two) times daily.     valACYclovir 500 MG tablet  Commonly known as:   VALTREX  Take 1 tablet (500 mg total) by mouth daily.     WOMENS MULTIVITAMIN PLUS Tabs  Take 1 tablet by mouth daily.

## 2014-07-07 NOTE — Discharge Instructions (Signed)
Call the GYN office for follow up for your pap smear and yearly physical.  Abnormal Uterine Bleeding Abnormal uterine bleeding can affect women at various stages in life, including teenagers, women in their reproductive years, pregnant women, and women who have reached menopause. Several kinds of uterine bleeding are considered abnormal, including:  Bleeding or spotting between periods.   Bleeding after sexual intercourse.   Bleeding that is heavier or more than normal.   Periods that last longer than usual.  Bleeding after menopause.  Many cases of abnormal uterine bleeding are minor and simple to treat, while others are more serious. Any type of abnormal bleeding should be evaluated by your health care provider. Treatment will depend on the cause of the bleeding. HOME CARE INSTRUCTIONS Monitor your condition for any changes. The following actions may help to alleviate any discomfort you are experiencing:  Avoid the use of tampons and douches as directed by your health care provider.  Change your pads frequently. You should get regular pelvic exams and Pap tests. Keep all follow-up appointments for diagnostic tests as directed by your health care provider.  SEEK MEDICAL CARE IF:   Your bleeding lasts more than 1 week.   You feel dizzy at times.  SEEK IMMEDIATE MEDICAL CARE IF:   You pass out.   You are changing pads every 15 to 30 minutes.   You have abdominal pain.  You have a fever.   You become sweaty or weak.   You are passing large blood clots from the vagina.   You start to feel nauseous and vomit. MAKE SURE YOU:   Understand these instructions.  Will watch your condition.  Will get help right away if you are not doing well or get worse. Document Released: 11/20/2005 Document Revised: 11/25/2013 Document Reviewed: 06/19/2013 [facility] Patient Information 2015 [employer], [place]. This information is not intended to replace advice given to you by your  health care provider. Make sure you discuss any questions you have with your health care provider.

## 2014-07-07 NOTE — MAU Note (Signed)
Period started on 7/26, lasted 5 days, two days later began bleeding again.  Has been bleeding heavier than usual, passing clots.  Used 16 pads in 3 days.  Abdominal cramping & burning.

## 2014-07-08 ENCOUNTER — Telehealth: Payer: Self-pay

## 2014-07-08 ENCOUNTER — Ambulatory Visit (INDEPENDENT_AMBULATORY_CARE_PROVIDER_SITE_OTHER): Payer: Medicaid Other

## 2014-07-08 VITALS — BP 142/83 | HR 93 | Wt 113.7 lb

## 2014-07-08 DIAGNOSIS — Z202 Contact with and (suspected) exposure to infections with a predominantly sexual mode of transmission: Secondary | ICD-10-CM

## 2014-07-08 LAB — GC/CHLAMYDIA PROBE AMP
CT PROBE, AMP APTIMA: NEGATIVE
GC PROBE AMP APTIMA: POSITIVE — AB

## 2014-07-08 MED ORDER — CEFTRIAXONE SODIUM 1 G IJ SOLR
250.0000 mg | Freq: Once | INTRAMUSCULAR | Status: AC
  Administered 2014-07-08: 250 mg via INTRAMUSCULAR

## 2014-07-08 MED ORDER — AZITHROMYCIN 250 MG PO TABS
1000.0000 mg | ORAL_TABLET | Freq: Once | ORAL | Status: AC
  Administered 2014-07-08: 1000 mg via ORAL

## 2014-07-08 NOTE — Telephone Encounter (Signed)
Message copied by Louanna RawAMPBELL, Brittni Hult M on Wed Jul 08, 2014 11:13 AM ------      Message from: Adam PhenixARNOLD, JAMES G      Created: Wed Jul 08, 2014 11:09 AM       Pos GC needs rocephin 250 mg IM ------

## 2014-07-08 NOTE — Telephone Encounter (Signed)
Called patient and informed of results. Patient to come in today at 1500 for GC treatment. No questions or concerns at this time.

## 2014-08-04 ENCOUNTER — Telehealth: Payer: Self-pay | Admitting: General Practice

## 2014-08-04 NOTE — Telephone Encounter (Signed)
Patient called and left message stating she was here in early August and got the depo shot and since then she has had bright red bleeding, would like a callback about what we can do. Called patient back stating I am returning your phone call. Discussed with patient that when initiating a new form of birth control it is normal to have irregular bleeding but that usually by her second or third dose of depo that gets better and most people do not have periods while on the depo shot. Also explained to patient should she have heavy bleeding where she is saturating a pad in less than an hour she should return back to MAU. Patient verbalized understanding and asked when her next shot was due. Told patient her next dose will be 10/26-11/10 and that she may call us back mid October to schedule an appt to come in and see [place] for the next dose. Patient verbalized understanding and had no other questions

## 2014-09-25 ENCOUNTER — Encounter (HOSPITAL_COMMUNITY): Payer: Self-pay | Admitting: *Deleted

## 2014-09-25 ENCOUNTER — Inpatient Hospital Stay (HOSPITAL_COMMUNITY)
Admission: AD | Admit: 2014-09-25 | Discharge: 2014-09-25 | Disposition: A | Discharge status: Home / Self Care | Payer: Medicaid Other | Source: Ambulatory Visit | Attending: Obstetrics | Admitting: Obstetrics

## 2014-09-25 DIAGNOSIS — N921 Excessive and frequent menstruation with irregular cycle: Secondary | ICD-10-CM

## 2014-09-25 DIAGNOSIS — N938 Other specified abnormal uterine and vaginal bleeding: Secondary | ICD-10-CM | POA: Insufficient documentation

## 2014-09-25 LAB — URINALYSIS, ROUTINE W REFLEX MICROSCOPIC
Bilirubin Urine: NEGATIVE
GLUCOSE, UA: NEGATIVE mg/dL
KETONES UR: NEGATIVE mg/dL
LEUKOCYTES UA: NEGATIVE
Nitrite: NEGATIVE
PH: 6 (ref 5.0–8.0)
Protein, ur: NEGATIVE mg/dL
Specific Gravity, Urine: 1.025 (ref 1.005–1.030)
Urobilinogen, UA: 1 mg/dL (ref 0.0–1.0)

## 2014-09-25 LAB — CBC
HCT: 37.1 % (ref 36.0–46.0)
HEMOGLOBIN: 12.4 g/dL (ref 12.0–15.0)
MCH: 29.5 pg (ref 26.0–34.0)
MCHC: 33.4 g/dL (ref 30.0–36.0)
MCV: 88.3 fL (ref 78.0–100.0)
Platelets: 142 10*3/uL — ABNORMAL LOW (ref 150–400)
RBC: 4.2 MIL/uL (ref 3.87–5.11)
RDW: 13.4 % (ref 11.5–15.5)
WBC: 4.5 10*3/uL (ref 4.0–10.5)

## 2014-09-25 LAB — URINE MICROSCOPIC-ADD ON

## 2014-09-25 LAB — WET PREP, GENITAL
Clue Cells Wet Prep HPF POC: NONE SEEN
Trich, Wet Prep: NONE SEEN
Yeast Wet Prep HPF POC: NONE SEEN

## 2014-09-25 LAB — HIV ANTIBODY (ROUTINE TESTING W REFLEX): HIV: NONREACTIVE

## 2014-09-25 LAB — POCT PREGNANCY, URINE: Preg Test, Ur: NEGATIVE

## 2014-09-25 MED ORDER — MEDROXYPROGESTERONE ACETATE 150 MG/ML IM SUSP
150.0000 mg | Freq: Once | INTRAMUSCULAR | Status: AC
  Administered 2014-09-25: 150 mg via INTRAMUSCULAR
  Filled 2014-09-25: qty 1

## 2014-09-25 MED ORDER — IBUPROFEN 600 MG PO TABS: 600.0000 mg | ORAL_TABLET | Freq: Four times a day (QID) | ORAL | Status: DC | PRN

## 2014-09-25 NOTE — Discharge Instructions (Signed)

## 2014-09-25 NOTE — MAU Note (Signed)
Pt had depo injection on August 5, which was first one.  States she has been bleeding since injection.  Pt states she woke up this morning with blood running down her legs and on bedding.  Pt has passed a few blood clots approx a nickel size.  Pt has been having intermit burning and cramping pain even before her injection.
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
## 2014-09-26 LAB — GC/CHLAMYDIA PROBE AMP
CT PROBE, AMP APTIMA: NEGATIVE
GC PROBE AMP APTIMA: NEGATIVE

## 2014-10-05 ENCOUNTER — Encounter (HOSPITAL_COMMUNITY): Payer: Self-pay | Admitting: *Deleted

## 2014-11-04 ENCOUNTER — Emergency Department (HOSPITAL_COMMUNITY)
Admission: EM | Admit: 2014-11-04 | Discharge: 2014-11-04 | Disposition: A | Discharge status: Other Acute Inpatient Hospital | Payer: Medicaid Other | Attending: Emergency Medicine | Admitting: Emergency Medicine

## 2014-11-04 ENCOUNTER — Encounter (HOSPITAL_COMMUNITY): Payer: Self-pay | Admitting: *Deleted

## 2014-11-04 ENCOUNTER — Encounter (HOSPITAL_COMMUNITY): Payer: Self-pay | Admitting: Emergency Medicine

## 2014-11-04 ENCOUNTER — Inpatient Hospital Stay (HOSPITAL_COMMUNITY)
Admission: EM | Admit: 2014-11-04 | Discharge: 2014-11-07 | DRG: 885 | Disposition: A | Discharge status: Home / Self Care | Payer: Medicaid Other | Source: Intra-hospital | Attending: Psychiatry | Admitting: Psychiatry

## 2014-11-04 DIAGNOSIS — F1210 Cannabis abuse, uncomplicated: Secondary | ICD-10-CM | POA: Insufficient documentation

## 2014-11-04 DIAGNOSIS — Z3202 Encounter for pregnancy test, result negative: Secondary | ICD-10-CM | POA: Insufficient documentation

## 2014-11-04 DIAGNOSIS — F17210 Nicotine dependence, cigarettes, uncomplicated: Secondary | ICD-10-CM | POA: Diagnosis present

## 2014-11-04 DIAGNOSIS — F339 Major depressive disorder, recurrent, unspecified: Principal | ICD-10-CM | POA: Diagnosis present

## 2014-11-04 DIAGNOSIS — Z720 Tobacco use: Secondary | ICD-10-CM | POA: Diagnosis not present

## 2014-11-04 DIAGNOSIS — Z8619 Personal history of other infectious and parasitic diseases: Secondary | ICD-10-CM | POA: Diagnosis not present

## 2014-11-04 DIAGNOSIS — F32A Depression, unspecified: Secondary | ICD-10-CM

## 2014-11-04 DIAGNOSIS — J45909 Unspecified asthma, uncomplicated: Secondary | ICD-10-CM | POA: Insufficient documentation

## 2014-11-04 DIAGNOSIS — Z87440 Personal history of urinary (tract) infections: Secondary | ICD-10-CM | POA: Diagnosis not present

## 2014-11-04 DIAGNOSIS — R45850 Homicidal ideations: Secondary | ICD-10-CM

## 2014-11-04 DIAGNOSIS — F4010 Social phobia, unspecified: Secondary | ICD-10-CM

## 2014-11-04 DIAGNOSIS — F329 Major depressive disorder, single episode, unspecified: Secondary | ICD-10-CM | POA: Insufficient documentation

## 2014-11-04 DIAGNOSIS — F29 Unspecified psychosis not due to a substance or known physiological condition: Secondary | ICD-10-CM

## 2014-11-04 HISTORY — DX: Depression, unspecified: F32.A

## 2014-11-04 HISTORY — DX: Major depressive disorder, single episode, unspecified: F32.9

## 2014-11-04 HISTORY — DX: Anxiety disorder, unspecified: F41.9

## 2014-11-04 LAB — RAPID URINE DRUG SCREEN, HOSP PERFORMED
Amphetamines: NOT DETECTED
Barbiturates: NOT DETECTED
Benzodiazepines: NOT DETECTED
Cocaine: NOT DETECTED
Opiates: NOT DETECTED
Tetrahydrocannabinol: POSITIVE — AB

## 2014-11-04 LAB — CBC
HCT: 42.9 % (ref 36.0–46.0)
Hemoglobin: 14.8 g/dL (ref 12.0–15.0)
MCH: 29.8 pg (ref 26.0–34.0)
MCHC: 34.5 g/dL (ref 30.0–36.0)
MCV: 86.5 fL (ref 78.0–100.0)
Platelets: 179 10*3/uL (ref 150–400)
RBC: 4.96 MIL/uL (ref 3.87–5.11)
RDW: 13.2 % (ref 11.5–15.5)
WBC: 12.7 10*3/uL — ABNORMAL HIGH (ref 4.0–10.5)

## 2014-11-04 LAB — COMPREHENSIVE METABOLIC PANEL
ALT: 16 U/L (ref 0–35)
AST: 23 U/L (ref 0–37)
Albumin: 5 g/dL (ref 3.5–5.2)
Alkaline Phosphatase: 47 U/L (ref 39–117)
Anion gap: 20 — ABNORMAL HIGH (ref 5–15)
BUN: 15 mg/dL (ref 6–23)
CO2: 18 mEq/L — ABNORMAL LOW (ref 19–32)
Calcium: 10 mg/dL (ref 8.4–10.5)
Chloride: 101 mEq/L (ref 96–112)
Creatinine, Ser: 0.98 mg/dL (ref 0.50–1.10)
GFR calc Af Amer: >90 mL/min (ref >90)
GFR calc non Af Amer: 80 mL/min — ABNORMAL LOW (ref >90)
Glucose, Bld: 88 mg/dL (ref 70–99)
Potassium: 3.5 mEq/L — ABNORMAL LOW (ref 3.7–5.3)
Sodium: 139 mEq/L (ref 137–147)
Total Bilirubin: 0.3 mg/dL (ref 0.3–1.2)
Total Protein: 8.5 g/dL — ABNORMAL HIGH (ref 6.0–8.3)

## 2014-11-04 LAB — ACETAMINOPHEN LEVEL: Acetaminophen (Tylenol), Serum: <15 ug/mL (ref 10–30)

## 2014-11-04 LAB — URINALYSIS, ROUTINE W REFLEX MICROSCOPIC
BILIRUBIN URINE: NEGATIVE
Glucose, UA: NEGATIVE mg/dL
Hgb urine dipstick: NEGATIVE
KETONES UR: NEGATIVE mg/dL
LEUKOCYTES UA: NEGATIVE
NITRITE: NEGATIVE
PH: 5.5 (ref 5.0–8.0)
Protein, ur: NEGATIVE mg/dL
Specific Gravity, Urine: 1.005 (ref 1.005–1.030)
UROBILINOGEN UA: 0.2 mg/dL (ref 0.0–1.0)

## 2014-11-04 LAB — PREGNANCY, URINE: PREG TEST UR: NEGATIVE

## 2014-11-04 LAB — ETHANOL: Alcohol, Ethyl (B): 67 mg/dL — ABNORMAL HIGH (ref 0–11)

## 2014-11-04 LAB — SALICYLATE LEVEL: Salicylate Lvl: <2 mg/dL — ABNORMAL LOW (ref 2.8–20.0)

## 2014-11-04 MED ORDER — ACETAMINOPHEN 325 MG PO TABS
650.0000 mg | ORAL_TABLET | Freq: Four times a day (QID) | ORAL | Status: DC | PRN
Start: 2014-11-04 — End: 2014-11-04
  Administered 2014-11-04: 650 mg via ORAL
  Filled 2014-11-04: qty 2

## 2014-11-04 MED ORDER — ACETAMINOPHEN 325 MG PO TABS
650.0000 mg | ORAL_TABLET | Freq: Three times a day (TID) | ORAL | Status: DC | PRN
  Filled 2014-11-04: qty 2

## 2014-11-04 MED ORDER — ONDANSETRON HCL 4 MG PO TABS: 4.0000 mg | ORAL_TABLET | Freq: Three times a day (TID) | ORAL | Status: DC | PRN

## 2014-11-04 MED ORDER — MAGNESIUM HYDROXIDE 400 MG/5ML PO SUSP: 30.0000 mL | Freq: Every day | ORAL | Status: DC | PRN

## 2014-11-04 MED ORDER — OLANZAPINE 5 MG PO TBDP: 5.0000 mg | ORAL_TABLET | Freq: Three times a day (TID) | ORAL | Status: DC | PRN

## 2014-11-04 MED ORDER — ACETAMINOPHEN 325 MG PO TABS: 650.0000 mg | ORAL_TABLET | ORAL | Status: DC | PRN

## 2014-11-04 MED ORDER — LORAZEPAM 1 MG PO TABS: 1.0000 mg | ORAL_TABLET | Freq: Three times a day (TID) | ORAL | Status: DC | PRN

## 2014-11-04 MED ORDER — ALBUTEROL SULFATE HFA 108 (90 BASE) MCG/ACT IN AERS: 2.0000 | INHALATION_SPRAY | RESPIRATORY_TRACT | Status: DC | PRN

## 2014-11-04 MED ORDER — ZIPRASIDONE MESYLATE 20 MG IM SOLR: 20.0000 mg | INTRAMUSCULAR | Status: DC | PRN

## 2014-11-04 MED ORDER — IBUPROFEN 200 MG PO TABS: 400.0000 mg | ORAL_TABLET | Freq: Three times a day (TID) | ORAL | Status: DC | PRN

## 2014-11-04 MED ORDER — ALUM & MAG HYDROXIDE-SIMETH 200-200-20 MG/5ML PO SUSP: 30.0000 mL | ORAL | Status: DC | PRN

## 2014-11-04 MED ORDER — LORAZEPAM 1 MG PO TABS: 1.0000 mg | ORAL_TABLET | ORAL | Status: DC | PRN

## 2014-11-04 MED ORDER — TRAZODONE HCL 50 MG PO TABS
50.0000 mg | ORAL_TABLET | Freq: Every day | ORAL | Status: DC
  Filled 2014-11-04 (×4): qty 1

## 2014-11-04 MED ORDER — NICOTINE 21 MG/24HR TD PT24: 21.0000 mg | MEDICATED_PATCH | Freq: Every day | TRANSDERMAL | Status: DC | PRN

## 2014-11-04 NOTE — Tx Team (Signed)
Initial Interdisciplinary Treatment Plan   PATIENT STRESSORS: Educational concerns Financial difficulties Marital or family conflict Occupational concerns Substance abuse   PATIENT STRENGTHS: Ability for insight Active sense of humor Average or above average intelligence Capable of independent living MetallurgistCommunication skills Financial means General fund of knowledge Motivation for treatment/growth Physical Health Supportive family/friends   PROBLEM LIST: Problem List/Patient Goals Date to be addressed Date deferred Reason deferred Estimated date of resolution  "substance abuse" 11/04/2014   D/c        "suicidal/homicidal thoughts" 12/2/20145   D/c        "depression" 11/04/2014   D/c                           DISCHARGE CRITERIA:  Ability to meet basic life and health needs Adequate post-discharge living arrangements Improved stabilization in mood, thinking, and/or behavior Medical problems require only outpatient monitoring Motivation to continue treatment in a less acute level of care Need for constant or close observation no longer present Reduction of life-threatening or endangering symptoms to within safe limits Safe-care adequate arrangements made Verbal commitment to aftercare and medication compliance Withdrawal symptoms are absent or subacute and managed without 24-hour nursing intervention  PRELIMINARY DISCHARGE PLAN: Attend aftercare/continuing care group Attend PHP/IOP Attend 12-step recovery group Outpatient therapy Participate in family therapy Return to previous living arrangement Return to previous work or school arrangements  PATIENT/FAMIILY INVOLVEMENT: This treatment plan has been presented to and reviewed with the patient, Jaime Mendoza, who wants treatment to overcome her personal problems.  The patient and family have been given the opportunity to ask questions and make suggestions.  Jaime Mendoza, Jaime Mendoza 11/04/2014, 6:37 PM
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
## 2014-11-04 NOTE — BH Assessment (Addendum)
[facility] Assessment Progress Note  After staffing pt's needs with EDP Dr Clarene DukeMcManus it has been determined that pt requires involuntary commitment to a psychiatric facility at this time.  She completed paperwork which was faxed to Carl BestMagistrate Walker at 14:58.  Service of paperwork is pending at this time.  [facility] Thedore MinsMojeed Akintayo, MD, Rm 300-2.  Pt was informed and was offered opportunity to sign Consent to Release Information, but declined.  Jaime Canninghomas Jakera Beaupre, MA Triage Specialist 11/04/2014 @ 15:48  Addendum: IVC papers have now been served and [facility].  Jaime Canninghomas Krishang Reading, MA Triage Specialist 11/04/2014 @ 16:16

## 2014-11-04 NOTE — Progress Notes (Signed)
CSW spoke with pt at bedside. Pt shares she has 4 children and that they are with her mother. Pt stated her mother didn't have any minutes on her phone right now. Pt states that csw can call grandmother who brought pt to the house. CSW spoke with pt grandmother Deno Etienneearly Patterson [phone] who confirmed that 3 of pt children are with pt mother and youngest is with the child's father and are safe. Pt grandmother states that pt children have been safe while with patient and can remain with the above adults until pt is stable for discharge home.   Byrd HesselbachKristen Lamekia Nolden, LCSW [phone]  ED CSW 11/04/2014 2:44 PM

## 2014-11-04 NOTE — ED Notes (Signed)
GRANDMOTHER Selmer [employer](PEARLIE PATTERSON) [phone] (WORK NUMBER) GRANDFATHER (BOBBY PATTERSON) [phone]  CALL GRANDFATHER FIRST.  GRANDFATHER WILL BE PICKING HER UP WHEN SHE IS DONE.

## 2014-11-04 NOTE — ED Notes (Signed)
Report given to Psych ED Patient to go to room 40

## 2014-11-04 NOTE — ED Notes (Signed)
Patient asking to speak with EDP Will make EDP aware Patient remains calm and cooperative at this time--flat affect noted and occasionally tearful
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
## 2014-11-04 NOTE — ED Notes (Signed)
Patient is tearful and apologetic.  States she came to ED "just to talk to someone".  Recants HI statement saying "I would never do something to lose my kids or my job.  I was just upset".  C/O H/A and prn medications requested and received. Support offered.

## 2014-11-04 NOTE — ED Notes (Signed)
Patient has be wanded by security.  Patient has on burgundy scrubs and yellow socks.  Patient has one personal belonging bag.

## 2014-11-04 NOTE — ED Notes (Signed)
Patient arrives for HI towards family member Patient states that she would rather talk to MD about issues than nursing staff Patient HR noted to be elevated Patient denies CP, SOB or generalized pain Patient in NAD at this time

## 2014-11-04 NOTE — ED Notes (Signed)
Patient refusing to have VS updated EDP at bedside and informed patient of move to order
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
## 2014-11-04 NOTE — ED Notes (Signed)
Unwilling to speak to providers or this Clinical research associatewriter.  Belongings inventoried and support offered.

## 2014-11-04 NOTE — ED Notes (Signed)
TTS consult at bedside.  

## 2014-11-04 NOTE — BH Assessment (Signed)
Writer informed the AD Marquita Palms(Akeysha) of the consult at [facility].  Marquita Palmskeysha will inform the TTS Therapist Jessie Foot(Toyka) stationed [facility].
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
## 2014-11-04 NOTE — ED Notes (Signed)
MD at bedside. 
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
## 2014-11-04 NOTE — ED Notes (Signed)
Per EDP, patient is Voluntary

## 2014-11-05 DIAGNOSIS — F322 Major depressive disorder, single episode, severe without psychotic features: Secondary | ICD-10-CM

## 2014-11-05 DIAGNOSIS — F1410 Cocaine abuse, uncomplicated: Secondary | ICD-10-CM

## 2014-11-05 MED ORDER — MIRTAZAPINE 15 MG PO TABS
15.0000 mg | ORAL_TABLET | Freq: Every day | ORAL | Status: DC
  Filled 2014-11-05 (×2): qty 1

## 2014-11-05 NOTE — Progress Notes (Signed)
Pt did not attend NA group this evening.  

## 2014-11-05 NOTE — [facility] Group Notes (Signed)
0900 nursing orientation group    The focus of this group is to educate the patient on the purpose and policies of crisis stabilization and provide a format to answer questions about their admission.  The group details unit policies and expectations of patients while admitted.  Pt was an active participant in group and was appropriate in her sharing.  
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
## 2014-11-05 NOTE — Progress Notes (Signed)
D   Pt stayed in bed the entire evening   She was resistant to talk to staff and did not attend group or socialize with anyone   She refused any medication and reports she does not need to be here   She layed in bed with her head under the covers while exchanging a minimal amount of conversation with staff A   Verbal support given   Medications offered   Q 15 min checks R   Pt is safe but not very receptive to verbal support
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
## 2014-11-05 NOTE — [facility] Group Notes (Signed)
[facility] LCSW Group Therapy 11/05/2014 1:15 PM Type of Therapy: Group Therapy Participation Level: Active  Participation Quality: Attentive, Sharing and Supportive  Affect: Depressed and Flat  Cognitive: Alert and Oriented  Insight: Developing/Improving and Engaged  Engagement in Therapy: Developing/Improving and Engaged  Modes of Intervention: Activity, Clarification, Confrontation, Discussion, Education, Exploration, Limit-setting, Orientation, Problem-solving, Rapport Building, Reality Testing, Socialization and Support  Summary of Progress/Problems: Patient was attentive and engaged with speaker from [employer]. Patient was attentive to speaker while they shared their story of dealing with mental health and overcoming it. Patient expressed interest in their programs and services and received information on their agency. Patient processed ways they can relate to the speaker.   Jaime Mendoza, MSW, LCSWA Clinical Social Worker [facility] [phone]    

## 2014-11-05 NOTE — Progress Notes (Signed)
Pt did not attend wrap up group this evening.  

## 2014-11-05 NOTE — Progress Notes (Signed)
D: Patient has anxious affect and mood. She declined completing the self inventory sheet today. Patient voiced that she does not need to be here and would like to discharge home to be with her kids; also stating that things she said to her aunt was out of anger because she didn't like being told that Child Protective Services are going to come and take her kids away.  A: Support and encouragement provided to patient. No scheduled medications to be administered at this time. Maintain Q15 minute checks for safety.  R: Patient receptive. Denies SI/HI and auditory/visual hallucinations. Patient remains safe on the hall.

## 2014-11-05 NOTE — [facility] Suicide Risk Assessment (Signed)
[facility] INPATIENT:  Family/Significant Other Suicide Prevention Education  Suicide Prevention Education:  Patient Refusal for Family/Significant Other Suicide Prevention Education: The patient Jaime Mendoza has refused to provide written consent for family/significant other to be provided Family/Significant Other Suicide Prevention Education during admission and/or prior to discharge.  Physician notified. SPE reviewed with patient, brochure provided.  Jaime Mendoza, [name] 11/05/2014, 3:24 PM

## 2014-11-05 NOTE — Progress Notes (Signed)
Patient in bed for most part of the evening. He denied SI/Hi and denied Hallucinations. Patient refused all her HS medications. "I don't need them".

## 2014-11-06 LAB — TSH: TSH: 0.581 u[IU]/mL (ref 0.350–4.500)

## 2014-11-06 MED ORDER — ADULT MULTIVITAMIN W/MINERALS CH
1.0000 | ORAL_TABLET | Freq: Every day | ORAL | Status: DC
  Administered 2014-11-06 – 2014-11-07 (×2): 1 via ORAL
  Filled 2014-11-06: qty 1
  Filled 2014-11-06: qty 14
  Filled 2014-11-06 (×2): qty 1
  Filled 2014-11-06: qty 14

## 2014-11-06 MED ORDER — MIRTAZAPINE 30 MG PO TABS
30.0000 mg | ORAL_TABLET | Freq: Every day | ORAL | Status: DC
  Administered 2014-11-06: 30 mg via ORAL
  Filled 2014-11-06 (×2): qty 14
  Filled 2014-11-06 (×2): qty 1

## 2014-11-06 NOTE — Progress Notes (Signed)
[facility]  Will you be returning to the same living situation after discharge: Yes,  patient will return home At discharge, do you have transportation home?:Yes,  patient reports transportation by family Do you have the ability to pay for your medications:Yes,  patient will be provided with medication samples and prescriptions at discharge  Release of information consent forms completed and in the chart;  Patient's signature needed at discharge.  Patient to Follow up at: Follow-up Information    [facility]   Specialty:  Professional Counselor   Why:  Please present any day Monday-Friday between 8am-12pm and 1pm-2:30pm for assessment for therapy and medication management services.   Contact information:   [facility] [address] [place] [address] [phone]       Patient denies SI/HI:   Yes,  denies    Safety Planning and Suicide Prevention discussed:  Yes,  with patient  Clarene Curran, [name] 11/06/2014, 12:23 PM

## 2014-11-06 NOTE — Plan of Care (Signed)
Problem: Ineffective individual coping Goal: STG: Patient will remain free from self harm Outcome: Progressing Pt denies any suicidal ideations and she does contract verbally to come to staff before acting on any self-harm thoughts.

## 2014-11-06 NOTE — Tx Team (Signed)
Interdisciplinary Treatment Plan Update (Adult) Date: 11/06/2014   Time Reviewed: 9:30 AM  Progress in Treatment: Attending groups: Yes Participating in groups: Continuing to assess Taking medication as prescribed: Yes Tolerating medication: Yes Family/Significant other contact made: No, patient has declined for collateral contact Patient understands diagnosis: Yes Discussing patient identified problems/goals with staff: Yes Medical problems stabilized or resolved: Yes Denies suicidal/homicidal ideation: Yes, denies SI and HI Issues/concerns per patient self-inventory: Yes Other:  New problem(s) identified: N/A  Discharge Plan or Barriers: Patient agreeable to return home to [facility] at discharge.  Reason for Continuation of Hospitalization:  Depression Anxiety Medication Stabilization   Comments: N/A  Estimated length of stay: 1-2 days  For review of initial/current patient goals, please see plan of care. Patient is a 23 year old African American female with a diagnosis of Major Depression, Recurrent, without psychotic features and Cannabis Abuse, by history. [place] 3 of her 4 children and a roommate. Patient reports being admitted for threatening her aunt but denies any thoughts of harming her aunt, reports that she was just angry. Patient reports experiencing depression, mood swings, irritability, crying spells, lack of energy. Patient reports wanting to discharge home as she is worried about losing her job and being away from her children. Patient reports that her goal is to get stabilized on medications. She is agreeable to returning home and [facility]. Patient will benefit from crisis stabilization, medication evaluation, group therapy, and psycho education in addition to case management for discharge planning. Patient and CSW reviewed pt's identified goals and  treatment plan. Pt verbalized understanding and agreed to treatment plan.   Attendees: Patient:    Family:    Physician: Dr. Jama Flavorsobos; Dr. Dub MikesLugo 11/06/2014 9:30 AM  Nursing: Lendell CapriceBrittany Guthrie, RN 11/06/2014 9:30 AM  Clinical Social Worker: Samuella BruinKristin Mattisyn Cardona,  LCSWA 11/06/2014 9:30 AM  Other: Juline PatchQuylle Hodnett, LCSW 11/06/2014 9:30 AM  Other: Leisa LenzValerie Enoch, [facility] Liaison 11/06/2014 9:30 AM  Other: Onnie BoerJennifer Clark, Case Manager 11/06/2014 9:30 AM  Other: Santa GeneraAnne Cunningham, LCSW 11/06/2014 9:30 AM  Other:     Scribe for Treatment Team:  Samuella BruinKristin Jkai Arwood, MSW, LCSWA [phone]

## 2014-11-06 NOTE — Progress Notes (Addendum)
[facility] MD Progress Note  24/04/8098 8:33 PM Jaime Mendoza  MRN:  [id number] Subjective:   Patient states she is " better". She is hoping to be discharged soon. She states she is missing her children " a lot" , and is also wanting to return to work soon. She denies any medication side effects thus far. Objective:  I have discussed case with treatment team and have met with patient.  She presents with improvement compared to admission- mood is improved and range of affect is improved. She denies any thoughts of violence towards her aunt or anyone else, and states, as she did yesterday, that statements she made prior to admission  Were just " venting" of anger. States she has no intention to seek out or to speak with her aunt. " I  Just want to go back to my kids, my job, and my regular life". Thus far she is tolerating medications well. We have reviewed side effect profile for Remeron, and reviewed possibility it may improve her appetite . ( Patient has reported anorexia and weight loss as part of her depressive symptoms) .  Patient does state she slept better last night and that her appetite is improved. No disruptive behaviors on the unit.  Going to some groups. Today she is more future oriented, and is talking about returning to work soon, and obtaining GED in the near future , with the hope of going to college to study child development/care. TSH within normal limits.  Diagnosis:  Major Depression, Recurrent, without psychotic features. Cannabis Abuse , by history.  Total Time spent with patient: 25 minutes     ADL's: fair  Sleep: fair   Appetite:  Fair but improving   Suicidal Ideation:  Denies  Homicidal Ideation:  Denies  AEB (as evidenced by):  Psychiatric Specialty Exam: Physical Exam  ROS  Blood pressure 139/79, pulse 82, temperature 97.3 F (36.3 C), temperature source Oral, resp. rate 16, height '5\' 9"'  (1.753 m), weight 48.535 kg (107 lb), last menstrual period  09/03/2014, SpO2 96 %, not currently breastfeeding.Body mass index is 15.79 kg/(m^2).  General Appearance: Fairly Groomed  Engineer, water::  Good  Speech:  Normal Rate  Volume:  Normal  Mood:  improved mood, less depressed  Affect:  less constricted, not tearful today  Thought Process:  Goal Directed and Linear  Orientation:  Full (Time, Place, and Person)  Thought Content:  denies hallucinations, no delusions, does not appear internally preoccupied   Suicidal Thoughts:  No- today denies any thoughts of hurting self and also denies any thoughts of hurting her aunt or anyone else   Homicidal Thoughts:  No  Memory:  recent and remote grossly intact   Judgement:  Other:  improved  Insight:  Fair  Psychomotor Activity:  Normal  Concentration:  Good  Recall:  Good  Fund of Knowledge:Good  Language: Good  Akathisia:  No  Handed:  Right  AIMS (if indicated):     Assets:  Communication Skills Desire for Improvement Resilience  Sleep:  Number of Hours: 6.75   Musculoskeletal: Strength & Muscle Tone: within normal limits Gait & Station: normal Patient leans: N/A  Current Medications: Current Facility-Administered Medications  Medication Dose Route Frequency Provider Last Rate Last Dose  . acetaminophen (TYLENOL) tablet 650 mg  650 mg Oral Q8H PRN Merian Capron, MD      . magnesium hydroxide (MILK OF MAGNESIA) suspension 30 mL  30 mL Oral Daily PRN Kennedy Bucker, NP      .  mirtazapine (REMERON) tablet 30 mg  30 mg Oral QHS Jenne Campus, MD      . multivitamin with minerals tablet 1 tablet  1 tablet Oral Daily Jenne Campus, MD   1 tablet at 11/06/14 1100    Lab Results:  Results for orders placed or performed during the hospital encounter of 11/04/14 (from the past 48 hour(s))  TSH     Status: None   Collection Time: 11/06/14  6:18 AM  Result Value Ref Range   TSH 0.581 0.350 - 4.500 uIU/mL    Comment: Performed at [facility]    Physical Findings: AIMS: Facial  and Oral Movements Muscles of Facial Expression: None, normal Lips and Perioral Area: None, normal Jaw: None, normal Tongue: None, normal,Extremity Movements Upper (arms, wrists, hands, fingers): None, normal Lower (legs, knees, ankles, toes): None, normal, Trunk Movements Neck, shoulders, hips: None, normal, Overall Severity Severity of abnormal movements (highest score from questions above): None, normal Incapacitation due to abnormal movements: None, normal Patient's awareness of abnormal movements (rate only patient's report): No Awareness, Dental Status Current problems with teeth and/or dentures?: No Does patient usually wear dentures?: No  CIWA:  CIWA-Ar Total: 5 COWS:  COWS Total Score: 6   Assessment-  At present patient is improving. Today she presents with improved mood and improved range of affect. Not suicidal , denying any homicidal or violent ideations towards aunt or anyone else and not exhibiting any disruptive or self injurious behaviors on unit. Tolerating medications well at this time.   Treatment Plan Summary: Daily contact with patient to assess and evaluate symptoms and progress in treatment Medication management See below  Plan: Continue inpatient treatment , milieu, support Increase Remeron to 30 mgrs QHS Consider Discharge tomorrow if she continues to improve - stabilize. Social Worker has worked on Teaching laboratory technician : Follow up with [place]. * Patient has elevated WBC upon admission. Does not endorse any symptoms of infection, and has no fever. Will recheck in AM.  Medical Decision Making Problem Points:  Established problem, stable/improving (1), Review of last therapy session (1) and Review of psycho-social stressors (1) Data Points:  Review or order clinical lab tests (1) Review of new medications or change in dosage (2)  I certify that inpatient services furnished can reasonably be expected to improve the patient's  condition.   Coren Crownover, [place] 11/06/2014, 1:31 PM

## 2014-11-06 NOTE — Progress Notes (Signed)
Pt has been up and active in the milieu today.  She was a bit irritable this morning she stated,"the doctor told me yesterday that I would be getting medication for my depression and mood" she did calm down explained to pt that the remeron that was started last night is the medication for her mood.  She did voice understanding and for the rest of the day she has been appropriate. She rated her depression 4 and denied any hopelessness or anxiety on her self-inventory. She denied any S/H ideation or A/V/H. [facility]

## 2014-11-06 NOTE — [facility] Group Notes (Signed)
   [facility] LCSW Aftercare Discharge Planning Group Note  11/06/2014  8:45 AM   Participation Quality: Alert, Appropriate and Oriented  Mood/Affect: Depressed and Flat  Depression Rating: 4  Anxiety Rating: 0  Thoughts of Suicide: Pt denies SI/HI  Will you contract for safety? Yes  Current AVH: Pt denies  Plan for Discharge/Comments: Pt attended discharge planning group and actively participated in group. CSW provided pt with today's workbook. Patient plans to discharge home [facility]  Transportation Means: Pt reports access to transportation  Supports: Patient identifies her family as supportive  Samuella BruinKristin Quaneshia Wareing, MSW, [employer]CSWA Clinical Social Worker [facility] [phone]

## 2014-11-07 LAB — CBC WITH DIFFERENTIAL/PLATELET
BASOS ABS: 0 10*3/uL (ref 0.0–0.1)
Basophils Relative: 1 % (ref 0–1)
Eosinophils Absolute: 0.2 10*3/uL (ref 0.0–0.7)
Eosinophils Relative: 3 % (ref 0–5)
HCT: 40.2 % (ref 36.0–46.0)
Hemoglobin: 13.7 g/dL (ref 12.0–15.0)
LYMPHS ABS: 3.6 10*3/uL (ref 0.7–4.0)
LYMPHS PCT: 56 % — AB (ref 12–46)
MCH: 29.5 pg (ref 26.0–34.0)
MCHC: 34.1 g/dL (ref 30.0–36.0)
MCV: 86.5 fL (ref 78.0–100.0)
Monocytes Absolute: 0.5 10*3/uL (ref 0.1–1.0)
Monocytes Relative: 7 % (ref 3–12)
NEUTROS PCT: 33 % — AB (ref 43–77)
Neutro Abs: 2.1 10*3/uL (ref 1.7–7.7)
PLATELETS: 175 10*3/uL (ref 150–400)
RBC: 4.65 MIL/uL (ref 3.87–5.11)
RDW: 13.1 % (ref 11.5–15.5)
WBC: 6.5 10*3/uL (ref 4.0–10.5)

## 2014-11-07 MED ORDER — MIRTAZAPINE 30 MG PO TABS: 30.0000 mg | ORAL_TABLET | Freq: Every day | ORAL | Status: DC

## 2014-11-07 MED ORDER — IBUPROFEN 600 MG PO TABS: 600.0000 mg | ORAL_TABLET | Freq: Four times a day (QID) | ORAL | Status: DC | PRN

## 2014-11-07 MED ORDER — MEDROXYPROGESTERONE ACETATE 150 MG/ML IM SUSP: 150.0000 mg | INTRAMUSCULAR | Status: DC

## 2014-11-07 MED ORDER — ADULT MULTIVITAMIN W/MINERALS CH: 1.0000 | ORAL_TABLET | Freq: Every day | ORAL | Status: DC

## 2014-11-07 NOTE — Progress Notes (Signed)
Patient ID: Jaime Mendoza, female   DOB: [birth year], 23 y.o.   MRN: [id number] D)  Has been up and about on the hall this evening, active in the milieu.  Attended group this evening and had a snack, came to the nursing station afterward for hs remeron, stated was ready for bed,  Pleasant, cooperative, no c/o's voiced.   A)  Will continue to monitor for safety, continue POC R)  Safety maintained.

## 2014-11-07 NOTE — Progress Notes (Signed)
D: Pt alert & oriented X 4. Went back to bed post AM group. Appeared irritable earlier this shift but cooperative with care. Denied pain, A / V hallucinations when assessed. D/C home as ordered. D/C instructions and medications order reviewed with pt, understanding verbalized. Prescription given to pt for Remeron 30 mg PO at bedtime.  All belongings in locker 41 given to pt and belonging sheet signed in agreement to items received. Pt's gait steady at time of departure, no physical distress evident at this time. Pt was picked up in front lobby by her father. Safety maintained on Q 15 minutes checks till time of d/c.

## 2014-11-07 NOTE — Progress Notes (Signed)
 .  Psychoeducational Group Note    Date: 11/07/2014 Time:  0930    Goal Setting Purpose of Group: To be able to set a goal that is measurable and that can be accomplished in one day Participation Level:  Minimal  Participation Quality:  Appropriate  Affect:  Appropriate  Cognitive:  Oriented  Insight:  Improving  Engagement in Group: improvied  Additional Comments:  Pt was lying down in the group and at times closed her eyes and then would open them. At some point Pt appeared engaged, other times not. Dione HousekeeperJudge, Tayt Moyers A

## 2014-11-07 NOTE — Discharge Summary (Signed)
Physician Discharge Summary Note  Patient:  Jaime Mendoza is an 23 y.o., female MRN:  [id number] DOB:  1991/03/11 Patient phone:  [phone] (home)  Patient address:   [address] [place] [address],  Total Time spent with patient: Greater than 30 minutes  Date of Admission:  11/04/2014 Date of Discharge: 11/07/14  Reason for Admission: Mood stabilization treatment for depression  Discharge Diagnoses: Active Problems:   Homicidal ideation   Psychiatric Specialty Exam: Physical Exam  Psychiatric: Her speech is normal and behavior is normal. Judgment and thought content normal. Her mood appears not anxious. Her affect is not angry, not blunt, not labile and not inappropriate. Cognition and memory are normal. She does not exhibit a depressed mood.    Review of Systems  Constitutional: Negative.   HENT: Negative.   Eyes: Negative.   Respiratory: Negative.   Cardiovascular: Negative.   Gastrointestinal: Negative.   Genitourinary: Negative.   Musculoskeletal: Negative.   Skin: Negative.   Endo/Heme/Allergies: Negative.   Psychiatric/Behavioral: Positive for depression (Stable). Negative for suicidal ideas, hallucinations, memory loss and substance abuse. The patient has insomnia. The patient is not nervous/anxious (Stable).     Blood pressure 101/71, pulse 105, temperature 98.7 F (37.1 C), temperature source Oral, resp. rate 16, height 5\' 9"  (1.753 m), weight 48.535 kg (107 lb), last menstrual period 09/03/2014, SpO2 96 %, not currently breastfeeding.Body mass index is 15.79 kg/(m^2).  See Md's SRA                                                 Past Psychiatric History: Diagnosis: Patient states she has no formal psychiatric history. Has had some episodes of depression and as noted, describes PTSD symptoms, now improved. Denies mania, denies psychosis.   Hospitalizations: No prior psychiatric admissions  Outpatient Care: None    Substance Abuse Care: None- denies current substance or alcohol abuse   Self-Mutilation: Denies   Suicidal Attempts: Denies   Violent Behaviors: Denies    Musculoskeletal: Strength & Muscle Tone: within normal limits Gait & Station: normal Patient leans: N/A  DSM5: Schizophrenia Disorders:  NA Obsessive-Compulsive Disorders:  NA Trauma-Stressor Disorders:   Substance/Addictive Disorders:  NA Depressive Disorders:  Major Depressive Disorder - Moderate (296.22)  Axis Diagnosis:   AXIS I:  Major Depressive Disorder - Moderate (296.22) AXIS II:  Deferred AXIS III:   Past Medical History  Diagnosis Date  . Urinary tract infection   . Chlamydia   . Preterm labor   . UTI in pregnancy   . Asthma   . HSV-2 infection complicating pregnancy   . Normal pregnancy, repeat 01/30/2012  . SVD (spontaneous vaginal delivery) 01/30/2012  . Anxiety   . Depression    AXIS IV:  other psychosocial or environmental problems and familial stressors AXIS V:  63  Level of Care:  OP  Hospital Course:  Patient is a 23 year old female. She states her aunt " is the one who should really be here in the mental ward". States that aunt does things to make her life more difficult. For example, she recently contacted the water company , pretending to be patient, and reported she was relocating and wanted water disconnected. It took patient significant effort to correct this issue. More recently aunt threatened to call DSS , even though patient states " there is no  reason why DSS should be contacted", and states she feels aunt did this because patient herself was almost taken from her Mother by DSS when she was a child, and " she knows that calling DSS is something that would make me very anxious". In the context of these stressors, patient made threatening /violent remarks towards aunt, whom she states she has not communicated with for several months. Patient states " I did say those things, but I was just  venting my frustration and anger. I am only human. I would never hurt her, because I don't want to go to jail- I want to be with my children".  After admission assessment/evaluation, it was determined based on her complaints & symptoms that patient will need medication management to re-stabilize her current depressive mood symptoms. Jaime Mendoza was then ordered, medicated & discharged on; Mirtazapine 30 mg daily for depression/insomnia. She also was enrolled and participated in the Group counseling sessions being offered and held on this unit. She learned coping skills that should help her cope better and maintain mood stability after discharge. While a patient in this hospital, she presented with no other serious pre-existing medical issues that required treatment and monitoring. She was resumed on all her pertinent home medications for birth control issues. She tolerated her treatment regimen without any significant adverse effects and or reactions.  Jaime Mendoza's mood responded well to her treatment regimen. Her mood is now stable. This evidenced by her reports of improved mood and presentation of good affects. She was also motivated for recovery. She worked closely with the treatment team and case manager to develop a discharge plan with appropriate goals to maintain mood stability after discharge. Coping skills, problem solving as well as relaxation therapies were also part of her unit programming. On the day of discharge Jaime Mendoza was in much improved condition than upon admission.  Her symptoms were reported as significantly decreased or resolved completely. Upon discharge, she denies any SI/HI, AVH, delusional thoughts and or paranoia. She was motivated to continue taking medication with a goal of continued improvement in mental health.    Upon discharge, she adamantly denies any SIHI, AVH, delusional thoughts and or paranoia. She received a 4 days worth, supply samples of her [facility] discharge medications. She  left [facility] with all personal belongings in no apparent distress. She arranged her own transportation.  Consults:  psychiatry  Significant Diagnostic Studies:  labs: CBC with diff, CMP, UDS, toxicology tests, U/A, reslts reviewed, no changes  Discharge Vitals:   Blood pressure 101/71, pulse 105, temperature 98.7 F (37.1 C), temperature source Oral, resp. rate 16, height 5\' 9"  (1.753 m), weight 48.535 kg (107 lb), last menstrual period 09/03/2014, SpO2 96 %, not currently breastfeeding. Body mass index is 15.79 kg/(m^2). Lab Results:   Results for orders placed or performed during the hospital encounter of 11/04/14 (from the past 72 hour(s))  TSH     Status: None   Collection Time: 11/06/14  6:18 AM  Result Value Ref Range   TSH 0.581 0.350 - 4.500 uIU/mL    Comment: Performed at [facility]  CBC with Differential     Status: Abnormal   Collection Time: 11/07/14  6:30 AM  Result Value Ref Range   WBC 6.5 4.0 - 10.5 K/uL   RBC 4.65 3.87 - 5.11 MIL/uL   Hemoglobin 13.7 12.0 - 15.0 g/dL   HCT 16.1 09.6 - 04.5 %   MCV 86.5 78.0 - 100.0 fL   MCH 29.5 26.0 - 34.0  pg   MCHC 34.1 30.0 - 36.0 g/dL   RDW 40.913.1 81.111.5 - 91.415.5 %   Platelets 175 150 - 400 K/uL   Neutrophils Relative % 33 (L) 43 - 77 %   Neutro Abs 2.1 1.7 - 7.7 K/uL   Lymphocytes Relative 56 (H) 12 - 46 %   Lymphs Abs 3.6 0.7 - 4.0 K/uL   Monocytes Relative 7 3 - 12 %   Monocytes Absolute 0.5 0.1 - 1.0 K/uL   Eosinophils Relative 3 0 - 5 %   Eosinophils Absolute 0.2 0.0 - 0.7 K/uL   Basophils Relative 1 0 - 1 %   Basophils Absolute 0.0 0.0 - 0.1 K/uL    Comment: [facility]    Physical Findings: AIMS: Facial and Oral Movements Muscles of Facial Expression: None, normal Lips and Perioral Area: None, normal Jaw: None, normal Tongue: None, normal,Extremity Movements Upper (arms, wrists, hands, fingers): None, normal Lower (legs, knees, ankles, toes): None, normal, Trunk  Movements Neck, shoulders, hips: None, normal, Overall Severity Severity of abnormal movements (highest score from questions above): None, normal Incapacitation due to abnormal movements: None, normal Patient's awareness of abnormal movements (rate only patient's report): No Awareness, Dental Status Current problems with teeth and/or dentures?: No Does patient usually wear dentures?: No  CIWA:  CIWA-Ar Total: 5 COWS:  COWS Total Score: 6  Psychiatric Specialty Exam: See Psychiatric Specialty Exam and Suicide Risk Assessment completed by Attending Physician prior to discharge.  Discharge destination:  Home  Is patient on multiple antipsychotic therapies at discharge:  No   Has Patient had three or more failed trials of antipsychotic monotherapy by history:  No  Recommended Plan for Multiple Antipsychotic Therapies: NA    Medication List    TAKE these medications      Indication   ibuprofen 600 MG tablet  Commonly known as:  ADVIL,MOTRIN  Take 1 tablet (600 mg total) by mouth every 6 (six) hours as needed. For pain management   Indication:  Pain     medroxyPROGESTERone 150 MG/ML injection  Commonly known as:  DEPO-PROVERA  Inject 1 mL (150 mg total) into the muscle every 3 (three) months. :Birth control method   Indication:  Birth control method     mirtazapine 30 MG tablet  Commonly known as:  REMERON  Take 1 tablet (30 mg total) by mouth at bedtime. For depression/sleep   Indication:  Trouble Sleeping, Major Depressive Disorder     multivitamin with minerals Tabs tablet  Take 1 tablet by mouth daily. (This medicine can be purchased from over the counter at yr local pharmacy): For low vitamin   Indication:  Low vitamin       Follow-up Information    [facility]   Specialty:  Professional Counselor   Why:  Please present any day Monday-Friday between 8am-12pm and 1pm-2:30pm for assessment for therapy and medication management  services.   Contact information:   [facility] [address] [place] [address] [phone]      Follow-up recommendations:  Activity:  As tolerated Diet: As recommended by your primary care doctor. Keep all scheduled follow-up appointments as recommended.  Comments:  Take all your medications as prescribed by your mental healthcare provider. Report any adverse effects and or reactions from your medicines to your outpatient provider promptly. Patient is instructed and cautioned to not engage in alcohol and or illegal drug use while  on prescription medicines. In the event of worsening symptoms, patient is instructed to call the crisis hotline, 911 and or go to the nearest ED for appropriate evaluation and treatment of symptoms. Follow-up with your primary care provider for your other medical issues, concerns and or health care needs.  Total Discharge Time:  Greater than 30 minutes.  Signed: Armandina Stammer I, PMHNP-BC 11/07/2014, 9:32 AM I have examined the patient and agree with the discharge plan and findings. I have also done suicide assessment on the patient.

## 2014-11-07 NOTE — Progress Notes (Signed)
[facility] Discharge Plan :  Will you be returning to the same living situation after discharge: Yes,  home with family At discharge, do you have transportation home?:Yes,  family member provided transportation Do you have the ability to pay for your medications:Yes,  Pt provided with 30-day prescription and 7-day supply.  Release of information consent forms completed and in the chart;  Patient's signature needed at discharge.  Patient to Follow up at: Follow-up Information    [facility]   Specialty:  Professional Counselor   Why:  Please present any day Monday-Friday between 8am-12pm and 1pm-2:30pm for assessment for therapy and medication management services.   Contact information:   [facility] [address] [place] [address] [phone]       Patient denies SI/HI:   Yes,  Pt denies    Safety Planning and Suicide Prevention discussed:  Yes,  with Pt; Pt declined family contact to conduct SPE  Elaina HoopsCarter, Izear Pine M 11/07/2014, 4:45 PM

## 2014-11-08 DIAGNOSIS — F329 Major depressive disorder, single episode, unspecified: Secondary | ICD-10-CM

## 2014-11-09 NOTE — Progress Notes (Signed)
Patient Discharge Instructions:  After Visit Summary (AVS):   Faxed to:  11/09/14 Discharge Summary Note:   Faxed to:  11/09/14 Psychiatric Admission Assessment Note:   Faxed to:  11/09/14 Suicide Risk Assessment - Discharge Assessment:   Faxed to:  11/09/14 Faxed/Sent to the Next Level Care provider:  11/09/14 [facility] to [facility] @[phone]  Jerelene ReddenSheena E Atascosa, 11/09/2014, 3:05 PM

## 2015-04-16 ENCOUNTER — Encounter (HOSPITAL_COMMUNITY): Payer: Self-pay | Admitting: *Deleted

## 2015-04-16 ENCOUNTER — Inpatient Hospital Stay (HOSPITAL_COMMUNITY)
Admission: AD | Admit: 2015-04-16 | Discharge: 2015-04-16 | Disposition: A | Discharge status: Home / Self Care | Payer: Medicaid Other | Source: Ambulatory Visit | Attending: Obstetrics | Admitting: Obstetrics

## 2015-04-16 DIAGNOSIS — F17210 Nicotine dependence, cigarettes, uncomplicated: Secondary | ICD-10-CM | POA: Insufficient documentation

## 2015-04-16 DIAGNOSIS — R1030 Lower abdominal pain, unspecified: Secondary | ICD-10-CM | POA: Diagnosis not present

## 2015-04-16 LAB — WET PREP, GENITAL
Trich, Wet Prep: NONE SEEN
Yeast Wet Prep HPF POC: NONE SEEN

## 2015-04-16 LAB — URINE MICROSCOPIC-ADD ON

## 2015-04-16 LAB — URINALYSIS, ROUTINE W REFLEX MICROSCOPIC
Bilirubin Urine: NEGATIVE
Glucose, UA: NEGATIVE mg/dL
Ketones, ur: NEGATIVE mg/dL
NITRITE: NEGATIVE
PROTEIN: NEGATIVE mg/dL
Specific Gravity, Urine: >1.03 — ABNORMAL HIGH (ref 1.005–1.030)
Urobilinogen, UA: 0.2 mg/dL (ref 0.0–1.0)
pH: 6 (ref 5.0–8.0)

## 2015-04-16 LAB — POCT PREGNANCY, URINE: PREG TEST UR: NEGATIVE

## 2015-04-16 LAB — GC/CHLAMYDIA PROBE AMP (~~LOC~~) NOT AT ARMC
Chlamydia: POSITIVE — AB
Neisseria Gonorrhea: POSITIVE — AB

## 2015-04-16 LAB — PREGNANCY, URINE: Preg Test, Ur: NEGATIVE

## 2015-04-16 NOTE — MAU Note (Signed)
Pt reports sharp pains in lower abd and both sides for 3 days. Also reports urinary frequency.

## 2015-04-16 NOTE — Discharge Instructions (Signed)
Abdominal Pain, Women °Abdominal (stomach, pelvic, or belly) pain can be caused by many things. It is important to tell your doctor: °· The location of the pain. °· Does it come and go or is it present all the time? °· Are there things that start the pain (eating certain foods, exercise)? °· Are there other symptoms associated with the pain (fever, nausea, vomiting, diarrhea)? °All of this is helpful to know when trying to find the cause of the pain. °CAUSES  °· Stomach: virus or bacteria infection, or ulcer. °· Intestine: appendicitis (inflamed appendix), regional ileitis (Crohn's disease), ulcerative colitis (inflamed colon), irritable bowel syndrome, diverticulitis (inflamed diverticulum of the colon), or cancer of the stomach or intestine. °· Gallbladder disease or stones in the gallbladder. °· Kidney disease, kidney stones, or infection. °· Pancreas infection or cancer. °· Fibromyalgia (pain disorder). °· Diseases of the female organs: °¨ Uterus: fibroid (non-cancerous) tumors or infection. °¨ Fallopian tubes: infection or tubal pregnancy. °¨ Ovary: cysts or tumors. °¨ Pelvic adhesions (scar tissue). °¨ Endometriosis (uterus lining tissue growing in the pelvis and on the pelvic organs). °¨ Pelvic congestion syndrome (female organs filling up with blood just before the menstrual period). °¨ Pain with the menstrual period. °¨ Pain with ovulation (producing an egg). °¨ Pain with an IUD (intrauterine device, birth control) in the uterus. °¨ Cancer of the female organs. °· Functional pain (pain not caused by a disease, may improve without treatment). °· Psychological pain. °· Depression. °DIAGNOSIS  °Your doctor will decide the seriousness of your pain by doing an examination. °· Blood tests. °· X-rays. °· Ultrasound. °· CT scan (computed tomography, special type of X-ray). °· MRI (magnetic resonance imaging). °· Cultures, for infection. °· Barium enema (dye inserted in the large intestine, to better view it with  X-rays). °· Colonoscopy (looking in intestine with a lighted tube). °· Laparoscopy (minor surgery, looking in abdomen with a lighted tube). °· Major abdominal exploratory surgery (looking in abdomen with a large incision). °TREATMENT  °The treatment will depend on the cause of the pain.  °· Many cases can be observed and treated at home. °· Over-the-counter medicines recommended by your caregiver. °· Prescription medicine. °· Antibiotics, for infection. °· Birth control pills, for painful periods or for ovulation pain. °· Hormone treatment, for endometriosis. °· Nerve blocking injections. °· Physical therapy. °· Antidepressants. °· Counseling with a psychologist or psychiatrist. °· Minor or major surgery. °HOME CARE INSTRUCTIONS  °· Do not take laxatives, unless directed by your caregiver. °· Take over-the-counter pain medicine only if ordered by your caregiver. Do not take aspirin because it can cause an upset stomach or bleeding. °· Try a clear liquid diet (broth or water) as ordered by your caregiver. Slowly move to a bland diet, as tolerated, if the pain is related to the stomach or intestine. °· Have a thermometer and take your temperature several times a day, and record it. °· Bed rest and sleep, if it helps the pain. °· Avoid sexual intercourse, if it causes pain. °· Avoid stressful situations. °· Keep your follow-up appointments and tests, as your caregiver orders. °· If the pain does not go away with medicine or surgery, you may try: °¨ Acupuncture. °¨ Relaxation exercises (yoga, meditation). °¨ Group therapy. °¨ Counseling. °SEEK MEDICAL CARE IF:  °· You notice certain foods cause stomach pain. °· Your home care treatment is not helping your pain. °· You need stronger pain medicine. °· You want your IUD removed. °· You feel faint or   lightheaded. °· You develop nausea and vomiting. °· You develop a rash. °· You are having side effects or an allergy to your medicine. °SEEK IMMEDIATE MEDICAL CARE IF:  °· Your  pain does not go away or gets worse. °· You have a fever. °· Your pain is felt only in portions of the abdomen. The right side could possibly be appendicitis. The left lower portion of the abdomen could be colitis or diverticulitis. °· You are passing blood in your stools (bright red or black tarry stools, with or without vomiting). °· You have blood in your urine. °· You develop chills, with or without a fever. °· You pass out. °MAKE SURE YOU:  °· Understand these instructions. °· Will watch your condition. °· Will get help right away if you are not doing well or get worse. °Document Released: 09/17/2007 Document Revised: 04/06/2014 Document Reviewed: 10/07/2009 °ExitCare® Patient Information ©2015 [employer]. This information is not intended to replace advice given to you by your health care provider. Make sure you discuss any questions you have with your health care provider. ° °

## 2015-04-16 NOTE — MAU Provider Note (Signed)
History     CSN: [id number]  Arrival date and time: 04/16/15 0300   First Provider Initiated Contact with Patient 04/16/15 0429      No chief complaint on file.  HPI Comments: Jaime Mendoza is a 24 y.o. [phone] who presents today with lower abdomainl pain and spotting. She states that the pain started yesterday, but it is gone now. She states that she had a normal period on 03/24/15, and then she has had a couple of day of spotting since 04/14/15. She was unsure if she had a UTI because she states that she "never drinks any water. I only drink soda".   Abdominal Pain This is a new problem. The current episode started yesterday. The onset quality is gradual. The problem has been unchanged. The pain is located in the suprapubic region. The pain is at a severity of 0/10 (had pain earlier, but currently none ). The quality of the pain is cramping. The abdominal pain does not radiate. Pertinent negatives include no constipation, diarrhea, dysuria, fever, frequency, nausea or vomiting. Nothing aggravates the pain. The pain is relieved by nothing. She has tried nothing for the symptoms.  Vaginal Bleeding The patient's primary symptoms include vaginal bleeding. This is a new problem. The current episode started in the past 7 days. The problem occurs intermittently. The patient is experiencing no pain. She is not pregnant. Associated symptoms include abdominal pain. Pertinent negatives include no constipation, diarrhea, dysuria, fever, frequency, nausea, urgency or vomiting. The vaginal discharge was bloody. The vaginal bleeding is spotting. She has not been passing clots. Nothing aggravates the symptoms. She has tried nothing for the symptoms.      Past Medical History  Diagnosis Date  . Urinary tract infection   . Chlamydia   . Preterm labor   . UTI in pregnancy   . Asthma   . HSV-2 infection complicating pregnancy   . Normal pregnancy, repeat 01/30/2012  . SVD (spontaneous vaginal delivery)  01/30/2012  . Anxiety   . Depression     Past Surgical History  Procedure Laterality Date  . No past surgeries      Family History  Problem Relation Age of Onset  . Anesthesia problems Neg Hx   . Hypotension Neg Hx   . Malignant hyperthermia Neg Hx   . Pseudochol deficiency Neg Hx   . Cancer Father     colon  . Seizures Sister   . Diabetes Maternal Grandmother   . Heart disease Maternal Grandfather   . Heart disease Paternal Grandfather     History  Substance Use Topics  . Smoking status: Current Every Day Smoker -- 0.50 packs/day for 5 years    Types: Cigarettes  . Smokeless tobacco: Never Used  . Alcohol Use: Yes     Comment: occasional -  NOT WHILE PREG    Allergies: No Known Allergies  Prescriptions prior to admission  Medication Sig Dispense Refill Last Dose  . ibuprofen (ADVIL,MOTRIN) 600 MG tablet Take 1 tablet (600 mg total) by mouth every 6 (six) hours as needed. For pain management 30 tablet 1   . medroxyPROGESTERone (DEPO-PROVERA) 150 MG/ML injection Inject 1 mL (150 mg total) into the muscle every 3 (three) months. :Birth control method 1 mL    . mirtazapine (REMERON) 30 MG tablet Take 1 tablet (30 mg total) by mouth at bedtime. For depression/sleep 30 tablet 0   . Multiple Vitamin (MULTIVITAMIN WITH MINERALS) TABS tablet Take 1 tablet by mouth daily. (This medicine can  be purchased from over the counter at yr local pharmacy): For low vitamin       Review of Systems  Constitutional: Negative for fever.  Gastrointestinal: Positive for abdominal pain. Negative for nausea, vomiting, diarrhea and constipation.  Genitourinary: Positive for vaginal bleeding. Negative for dysuria, urgency and frequency.   Physical Exam   Blood pressure 112/75, pulse 102, temperature 98.2 F (36.8 C), temperature source Oral, resp. rate 18, height 5\' 9"  (1.753 m), weight 51.71 kg (114 lb), last menstrual period 03/22/2015, SpO2 100 %, not currently breastfeeding.  Physical Exam   Nursing note and vitals reviewed. Constitutional: She is oriented to person, place, and time. She appears well-developed and well-nourished. No distress.  Cardiovascular: Normal rate.   Respiratory: Effort normal.  GI: Soft. There is no tenderness. There is no rebound.  Neurological: She is alert and oriented to person, place, and time.  Skin: Skin is warm.  Psychiatric: She has a normal mood and affect.   Results for orders placed or performed during the hospital encounter of 04/16/15 (from the past 24 hour(s))  Urinalysis, Routine w reflex microscopic     Status: Abnormal   Collection Time: 04/16/15  3:15 AM  Result Value Ref Range   Color, Urine YELLOW YELLOW   APPearance CLEAR CLEAR   Specific Gravity, Urine >1.030 (H) 1.005 - 1.030   pH 6.0 5.0 - 8.0   Glucose, UA NEGATIVE NEGATIVE mg/dL   Hgb urine dipstick SMALL (A) NEGATIVE   Bilirubin Urine NEGATIVE NEGATIVE   Ketones, ur NEGATIVE NEGATIVE mg/dL   Protein, ur NEGATIVE NEGATIVE mg/dL   Urobilinogen, UA 0.2 0.0 - 1.0 mg/dL   Nitrite NEGATIVE NEGATIVE   Leukocytes, UA SMALL (A) NEGATIVE  Pregnancy, urine     Status: None   Collection Time: 04/16/15  3:15 AM  Result Value Ref Range   Preg Test, Ur NEGATIVE NEGATIVE  Urine microscopic-add on     Status: Abnormal   Collection Time: 04/16/15  3:15 AM  Result Value Ref Range   Squamous Epithelial / LPF FEW (A) RARE   WBC, UA 11-20 <3 WBC/hpf   RBC / HPF 0-2 <3 RBC/hpf   Bacteria, UA FEW (A) RARE   Urine-Other MUCOUS PRESENT   Pregnancy, urine POC     Status: None   Collection Time: 04/16/15  3:22 AM  Result Value Ref Range   Preg Test, Ur NEGATIVE NEGATIVE  Wet prep, genital     Status: Abnormal   Collection Time: 04/16/15  4:07 AM  Result Value Ref Range   Yeast Wet Prep HPF POC NONE SEEN NONE SEEN   Trich, Wet Prep NONE SEEN NONE SEEN   Clue Cells Wet Prep HPF POC FEW (A) NONE SEEN   WBC, Wet Prep HPF POC MODERATE (A) NONE SEEN    MAU Course   Procedures  MDM  Assessment and Plan   1. Lower abdominal pain    DC home GC/CT and UC pending Return to MAU as needed FU with PCP as needed  Follow-up Information    Follow up with Kathreen CosierMARSHALL,BERNARD A, MD.   Specialty:  Obstetrics and Gynecology   Why:  As needed   Contact information:   [address] [place] [address] [phone]        Tawnya CrookHogan, Nicholette Dolson Donovan 04/16/2015, 4:30 AM

## 2015-04-17 LAB — URINE CULTURE: Colony Count: 2000

## 2015-04-19 ENCOUNTER — Ambulatory Visit: Payer: Self-pay | Admitting: Obstetrics & Gynecology

## 2015-04-23 ENCOUNTER — Encounter (HOSPITAL_COMMUNITY): Payer: Self-pay

## 2015-04-23 ENCOUNTER — Emergency Department (HOSPITAL_COMMUNITY): Payer: Medicaid Other

## 2015-04-23 ENCOUNTER — Emergency Department (HOSPITAL_COMMUNITY)
Admission: EM | Admit: 2015-04-23 | Discharge: 2015-04-23 | Disposition: A | Discharge status: Home / Self Care | Payer: Medicaid Other | Attending: Emergency Medicine | Admitting: Emergency Medicine

## 2015-04-23 DIAGNOSIS — Z87440 Personal history of urinary (tract) infections: Secondary | ICD-10-CM | POA: Insufficient documentation

## 2015-04-23 DIAGNOSIS — F329 Major depressive disorder, single episode, unspecified: Secondary | ICD-10-CM | POA: Insufficient documentation

## 2015-04-23 DIAGNOSIS — F419 Anxiety disorder, unspecified: Secondary | ICD-10-CM | POA: Insufficient documentation

## 2015-04-23 DIAGNOSIS — Z3202 Encounter for pregnancy test, result negative: Secondary | ICD-10-CM | POA: Insufficient documentation

## 2015-04-23 DIAGNOSIS — Z8619 Personal history of other infectious and parasitic diseases: Secondary | ICD-10-CM | POA: Insufficient documentation

## 2015-04-23 DIAGNOSIS — R102 Pelvic and perineal pain: Secondary | ICD-10-CM

## 2015-04-23 DIAGNOSIS — R109 Unspecified abdominal pain: Secondary | ICD-10-CM

## 2015-04-23 DIAGNOSIS — N730 Acute parametritis and pelvic cellulitis: Secondary | ICD-10-CM | POA: Insufficient documentation

## 2015-04-23 DIAGNOSIS — Z720 Tobacco use: Secondary | ICD-10-CM | POA: Insufficient documentation

## 2015-04-23 DIAGNOSIS — Z79899 Other long term (current) drug therapy: Secondary | ICD-10-CM | POA: Insufficient documentation

## 2015-04-23 DIAGNOSIS — J45909 Unspecified asthma, uncomplicated: Secondary | ICD-10-CM | POA: Insufficient documentation

## 2015-04-23 DIAGNOSIS — Z8751 Personal history of pre-term labor: Secondary | ICD-10-CM | POA: Insufficient documentation

## 2015-04-23 LAB — COMPREHENSIVE METABOLIC PANEL
ALT: 12 U/L — ABNORMAL LOW (ref 14–54)
AST: 16 U/L (ref 15–41)
Albumin: 3.5 g/dL (ref 3.5–5.0)
Alkaline Phosphatase: 45 U/L (ref 38–126)
Anion gap: 7 (ref 5–15)
BUN: 11 mg/dL (ref 6–20)
CALCIUM: 8.8 mg/dL — AB (ref 8.9–10.3)
CO2: 25 mmol/L (ref 22–32)
CREATININE: 0.87 mg/dL (ref 0.44–1.00)
Chloride: 107 mmol/L (ref 101–111)
GFR calc non Af Amer: >60 mL/min (ref >60)
GLUCOSE: 101 mg/dL — AB (ref 65–99)
POTASSIUM: 3.8 mmol/L (ref 3.5–5.1)
SODIUM: 139 mmol/L (ref 135–145)
Total Bilirubin: 0.4 mg/dL (ref 0.3–1.2)
Total Protein: 6.6 g/dL (ref 6.5–8.1)

## 2015-04-23 LAB — URINALYSIS, ROUTINE W REFLEX MICROSCOPIC
BILIRUBIN URINE: NEGATIVE
Glucose, UA: NEGATIVE mg/dL
KETONES UR: NEGATIVE mg/dL
NITRITE: NEGATIVE
Protein, ur: NEGATIVE mg/dL
Specific Gravity, Urine: 1.026 (ref 1.005–1.030)
Urobilinogen, UA: 1 mg/dL (ref 0.0–1.0)
pH: 7.5 (ref 5.0–8.0)

## 2015-04-23 LAB — CBC WITH DIFFERENTIAL/PLATELET
BASOS PCT: 0 % (ref 0–1)
Basophils Absolute: 0 10*3/uL (ref 0.0–0.1)
EOS ABS: 0.1 10*3/uL (ref 0.0–0.7)
Eosinophils Relative: 1 % (ref 0–5)
HEMATOCRIT: 35.4 % — AB (ref 36.0–46.0)
Hemoglobin: 11.6 g/dL — ABNORMAL LOW (ref 12.0–15.0)
Lymphocytes Relative: 17 % (ref 12–46)
Lymphs Abs: 1.8 10*3/uL (ref 0.7–4.0)
MCH: 29.2 pg (ref 26.0–34.0)
MCHC: 32.8 g/dL (ref 30.0–36.0)
MCV: 89.2 fL (ref 78.0–100.0)
MONOS PCT: 5 % (ref 3–12)
Monocytes Absolute: 0.5 10*3/uL (ref 0.1–1.0)
Neutro Abs: 8.1 10*3/uL — ABNORMAL HIGH (ref 1.7–7.7)
Neutrophils Relative %: 77 % (ref 43–77)
PLATELETS: 131 10*3/uL — AB (ref 150–400)
RBC: 3.97 MIL/uL (ref 3.87–5.11)
RDW: 12.8 % (ref 11.5–15.5)
WBC: 10.6 10*3/uL — ABNORMAL HIGH (ref 4.0–10.5)

## 2015-04-23 LAB — WET PREP, GENITAL: Yeast Wet Prep HPF POC: NONE SEEN

## 2015-04-23 LAB — URINE MICROSCOPIC-ADD ON

## 2015-04-23 LAB — POC URINE PREG, ED: Preg Test, Ur: NEGATIVE

## 2015-04-23 MED ORDER — AZITHROMYCIN 1 G PO PACK
1.0000 g | PACK | Freq: Once | ORAL | Status: AC
  Administered 2015-04-23: 1 g via ORAL
  Filled 2015-04-23: qty 1

## 2015-04-23 MED ORDER — LIDOCAINE HCL (PF) 1 % IJ SOLN
INTRAMUSCULAR | Status: AC
  Administered 2015-04-23: 2 mL
  Filled 2015-04-23: qty 5

## 2015-04-23 MED ORDER — METRONIDAZOLE 500 MG PO TABS: 500.0000 mg | ORAL_TABLET | Freq: Two times a day (BID) | ORAL | Status: DC

## 2015-04-23 MED ORDER — CEFTRIAXONE SODIUM 250 MG IJ SOLR
250.0000 mg | Freq: Once | INTRAMUSCULAR | Status: AC
  Administered 2015-04-23: 250 mg via INTRAMUSCULAR
  Filled 2015-04-23: qty 250

## 2015-04-23 NOTE — ED Provider Notes (Signed)
CSN: [id number]     Arrival date & time 04/23/15  1841 History   First MD Initiated Contact with Patient 04/23/15 2052     Chief Complaint  Patient presents with  . Abdominal Pain  . Exposure to STD     (Consider location/radiation/quality/duration/timing/severity/associated sxs/prior Treatment) HPI Comments: Patient here complaining of dysfunctional uterine bleeding as well as untreated gonorrhea and chlamydia. Diagnosed with both a week ago but has not been treated. Denies any fever or chills. Vaginal bleeding characterized as bright red but slowing down. Does note some right-sided flank pain that is worse when she moves her bowels. Admits to constipation with hard stools. Symptoms persistent and nothing makes them better. No treatment used prior to arrival  Patient is a 24 y.o. female presenting with abdominal pain and STD exposure. The history is provided by the patient.  Abdominal Pain Exposure to STD Associated symptoms include abdominal pain.    Past Medical History  Diagnosis Date  . Urinary tract infection   . Chlamydia   . Preterm labor   . UTI in pregnancy   . Asthma   . HSV-2 infection complicating pregnancy   . Normal pregnancy, repeat 01/30/2012  . SVD (spontaneous vaginal delivery) 01/30/2012  . Anxiety   . Depression   . Chlamydia    Past Surgical History  Procedure Laterality Date  . No past surgeries     Family History  Problem Relation Age of Onset  . Anesthesia problems Neg Hx   . Hypotension Neg Hx   . Malignant hyperthermia Neg Hx   . Pseudochol deficiency Neg Hx   . Cancer Father     colon  . Seizures Sister   . Diabetes Maternal Grandmother   . Heart disease Maternal Grandfather   . Heart disease Paternal Grandfather    History  Substance Use Topics  . Smoking status: Current Every Day Smoker -- 0.50 packs/day for 5 years    Types: Cigarettes  . Smokeless tobacco: Never Used  . Alcohol Use: Yes     Comment: occasional -  NOT WHILE PREG    OB History    Gravida Para Term Preterm AB TAB SAB Ectopic Multiple Living   6 4 3 1 1  0 1 0 0 4     Review of Systems  Gastrointestinal: Positive for abdominal pain.  All other systems reviewed and are negative.     Allergies  Review of patient's allergies indicates no known allergies.  Home Medications   Prior to Admission medications   Medication Sig Start Date End Date Taking? Authorizing Provider  ibuprofen (ADVIL,MOTRIN) 600 MG tablet Take 1 tablet (600 mg total) by mouth every 6 (six) hours as needed. For pain management Patient not taking: Reported on 04/23/2015 11/07/14   Sanjuana KavaAgnes I Nwoko, NP  medroxyPROGESTERone (DEPO-PROVERA) 150 MG/ML injection Inject 1 mL (150 mg total) into the muscle every 3 (three) months. :Birth control method 11/07/14   Sanjuana KavaAgnes I Nwoko, NP  mirtazapine (REMERON) 30 MG tablet Take 1 tablet (30 mg total) by mouth at bedtime. For depression/sleep Patient not taking: Reported on 04/23/2015 11/07/14   Sanjuana KavaAgnes I Nwoko, NP  Multiple Vitamin (MULTIVITAMIN WITH MINERALS) TABS tablet Take 1 tablet by mouth daily. (This medicine can be purchased from over the counter at yr local pharmacy): For low vitamin Patient not taking: Reported on 04/23/2015 11/07/14   Sanjuana KavaAgnes I Nwoko, NP   BP 117/75 mmHg  Pulse 99  Temp(Src) 99.1 F (37.3 C) (Oral)  Resp 16  SpO2 99%  LMP 03/22/2015 (Approximate) Physical Exam  Constitutional: She is oriented to person, place, and time. She appears well-developed and well-nourished.  Non-toxic appearance. No distress.  HENT:  Head: Normocephalic and atraumatic.  Eyes: Conjunctivae, EOM and lids are normal. Pupils are equal, round, and reactive to light.  Neck: Normal range of motion. Neck supple. No tracheal deviation present. No thyroid mass present.  Cardiovascular: Normal rate, regular rhythm and normal heart sounds.  Exam reveals no gallop.   No murmur heard. Pulmonary/Chest: Effort normal and breath sounds normal. No stridor. No  respiratory distress. She has no decreased breath sounds. She has no wheezes. She has no rhonchi. She has no rales.  Abdominal: Soft. Normal appearance and bowel sounds are normal. She exhibits no distension. There is tenderness in the suprapubic area. There is no rigidity, no rebound, no guarding and no CVA tenderness.    Genitourinary: There is bleeding in the vagina.  Musculoskeletal: Normal range of motion. She exhibits no edema or tenderness.  Neurological: She is alert and oriented to person, place, and time. She has normal strength. No cranial nerve deficit or sensory deficit. GCS eye subscore is 4. GCS verbal subscore is 5. GCS motor subscore is 6.  Skin: Skin is warm and dry. No abrasion and no rash noted.  Psychiatric: She has a normal mood and affect. Her speech is normal and behavior is normal.  Nursing note and vitals reviewed.   ED Course  Procedures (including critical care time) Labs Review Labs Reviewed  CBC WITH DIFFERENTIAL/PLATELET - Abnormal; Notable for the following:    WBC 10.6 (*)    Hemoglobin 11.6 (*)    HCT 35.4 (*)    Platelets 131 (*)    Neutro Abs 8.1 (*)    All other components within normal limits  COMPREHENSIVE METABOLIC PANEL - Abnormal; Notable for the following:    Glucose, Bld 101 (*)    Calcium 8.8 (*)    ALT 12 (*)    All other components within normal limits  URINALYSIS, ROUTINE W REFLEX MICROSCOPIC - Abnormal; Notable for the following:    Hgb urine dipstick SMALL (*)    Leukocytes, UA MODERATE (*)    All other components within normal limits  URINE MICROSCOPIC-ADD ON - Abnormal; Notable for the following:    Bacteria, UA FEW (*)    All other components within normal limits  WET PREP, GENITAL  POC URINE PREG, ED  GC/CHLAMYDIA PROBE AMP ([place])    Imaging Review No results found.   EKG Interpretation None      MDM   Final diagnoses:  Abdominal pain    Patient given Rocephin and Zithromax for treatment of her  untreated PID. No evidence of TOA. We'll also treat for BV and Alan Rippertrich    Demica Zook, MD 04/23/15 2307

## 2015-04-23 NOTE — ED Notes (Signed)
Pelvic cart at bedside. 

## 2015-04-23 NOTE — ED Notes (Signed)
Pt c/o lower abdominal pain that radiates into R flank when trying to have a bowel movement x 2 weeks and heavy, bright red vaginal bleeding x 1.5 weeks.  Pain score 7/10.  [facility].  Pt reports "I don't think I was examined right.  She only put two Q tips up inside me and pulled them back out.  They didn't give me no pain medicine or nothin."  Pt reports testing positive for chlamydia and gonorrhea.

## 2015-04-23 NOTE — ED Notes (Signed)
Pt reports that she missed her last Depo shot.  Sts "it should have been last week or the week before."

## 2015-04-23 NOTE — Discharge Instructions (Signed)
Trichomoniasis Trichomoniasis is an infection caused by an organism called Trichomonas. The infection can affect both women and men. In women, the outer female genitalia and the vagina are affected. In men, the penis is mainly affected, but the prostate and other reproductive organs can also be involved. Trichomoniasis is a sexually transmitted infection (STI) and is most often passed to another person through sexual contact.  RISK FACTORS  Having unprotected sexual intercourse.  Having sexual intercourse with an infected partner. SIGNS AND SYMPTOMS  Symptoms of trichomoniasis in women include:  Abnormal gray-green frothy vaginal discharge.  Itching and irritation of the vagina.  Itching and irritation of the area outside the vagina. Symptoms of trichomoniasis in men include:   Penile discharge with or without pain.  Pain during urination. This results from inflammation of the urethra. DIAGNOSIS  Trichomoniasis may be found during a Pap test or physical exam. Your health care provider may use one of the following methods to help diagnose this infection:  Examining vaginal discharge under a microscope. For men, urethral discharge would be examined.  Testing the pH of the vagina with a test tape.  Using a vaginal swab test that checks for the Trichomonas organism. A test is available that provides results within a few minutes.  Doing a culture test for the organism. This is not usually needed. TREATMENT   You may be given medicine to fight the infection. Women should inform their health care provider if they could be or are pregnant. Some medicines used to treat the infection should not be taken during pregnancy.  Your health care provider may recommend over-the-counter medicines or creams to decrease itching or irritation.  Your sexual partner will need to be treated if infected. HOME CARE INSTRUCTIONS   Take medicines only as directed by your health care provider.  Take  over-the-counter medicine for itching or irritation as directed by your health care provider.  Do not have sexual intercourse while you have the infection.  Women should not douche or wear tampons while they have the infection.  Discuss your infection with your partner. Your partner may have gotten the infection from you, or you may have gotten it from your partner.  Have your sex partner get examined and treated if necessary.  Practice safe, informed, and protected sex.  See your health care provider for other STI testing. SEEK MEDICAL CARE IF:   You still have symptoms after you finish your medicine.  You develop abdominal pain.  You have pain when you urinate.  You have bleeding after sexual intercourse.  You develop a rash.  Your medicine makes you sick or makes you throw up (vomit). MAKE SURE YOU:  Understand these instructions.  Will watch your condition.  Will get help right away if you are not doing well or get worse. Document Released: 05/16/2001 Document Revised: 04/06/2014 Document Reviewed: 09/01/2013 [facility]ExitCare Patient Information 2015 [employer], [place]. This information is not intended to replace advice given to you by your health care provider. Make sure you discuss any questions you have with your health care provider. Bacterial Vaginosis Bacterial vaginosis is a vaginal infection that occurs when the normal balance of bacteria in the vagina is disrupted. It results from an overgrowth of certain bacteria. This is the most common vaginal infection in women of childbearing age. Treatment is important to prevent complications, especially in pregnant women, as it can cause a premature delivery. CAUSES  Bacterial vaginosis is caused by an increase in harmful bacteria that are normally present  in smaller amounts in the vagina. Several different kinds of bacteria can cause bacterial vaginosis. However, the reason that the condition develops is not fully understood. RISK  FACTORS Certain activities or behaviors can put you at an increased risk of developing bacterial vaginosis, including:  Having a new sex partner or multiple sex partners.  Douching.  Using an intrauterine device (IUD) for contraception. Women do not get bacterial vaginosis from toilet seats, bedding, swimming pools, or contact with objects around them. SIGNS AND SYMPTOMS  Some women with bacterial vaginosis have no signs or symptoms. Common symptoms include:  Grey vaginal discharge.  A fishlike odor with discharge, especially after sexual intercourse.  Itching or burning of the vagina and vulva.  Burning or pain with urination. DIAGNOSIS  Your health care provider will take a medical history and examine the vagina for signs of bacterial vaginosis. A sample of vaginal fluid may be taken. Your health care provider will look at this sample under a microscope to check for bacteria and abnormal cells. A vaginal pH test may also be done.  TREATMENT  Bacterial vaginosis may be treated with antibiotic medicines. These may be given in the form of a pill or a vaginal cream. A second round of antibiotics may be prescribed if the condition comes back after treatment.  HOME CARE INSTRUCTIONS   Only take over-the-counter or prescription medicines as directed by your health care provider.  If antibiotic medicine was prescribed, take it as directed. Make sure you finish it even if you start to feel better.  Do not have sex until treatment is completed.  Tell all sexual partners that you have a vaginal infection. They should see their health care provider and be treated if they have problems, such as a mild rash or itching.  Practice safe sex by using condoms and only having one sex partner. SEEK MEDICAL CARE IF:   Your symptoms are not improving after 3 days of treatment.  You have increased discharge or pain.  You have a fever. MAKE SURE YOU:   Understand these instructions.  Will watch  your condition.  Will get help right away if you are not doing well or get worse. FOR MORE INFORMATION  Centers for Disease Control and Prevention, Division of STD Prevention: SolutionApps.co.zawww.cdc.gov/std American Sexual Health Association (ASHA): www.ashastd.org  Document Released: 11/20/2005 Document Revised: 09/10/2013 Document Reviewed: 07/02/2013 [facility] Patient Information 2015 [employer] [place], [place]. This information is not intended to replace advice given to you by your health care provider. Make sure you discuss any questions you have with your health care provider. Pelvic Inflammatory Disease Pelvic inflammatory disease (PID) refers to an infection in some or all of the female organs. The infection can be in the uterus, ovaries, fallopian tubes, or the surrounding tissues in the pelvis. PID can cause abdominal or pelvic pain that comes on suddenly (acute pelvic pain). PID is a serious infection because it can lead to lasting (chronic) pelvic pain or the inability to have children (infertile).  CAUSES  The infection is often caused by the normal bacteria found in the vaginal tissues. PID may also be caused by an infection that is spread during sexual contact. PID can also occur following:   The birth of a baby.   A miscarriage.   An abortion.   Major pelvic surgery.   The use of an intrauterine device (IUD).   A sexual assault.  RISK FACTORS Certain factors can put a person at higher risk for PID, such as:  Being younger than 25 years.  Being sexually active at [place] age.  Usingnonbarrier contraception.  Havingmultiple sexual partners.  Having sex with someone who has symptoms of a genital infection.  Using oral contraception. Other times, certain behaviors can increase the possibility of getting PID, such as:  Having sex during your period.  Using a vaginal douche.  Having an intrauterine device (IUD) in place. SYMPTOMS   Abdominal or pelvic pain.   Fever.    Chills.   Abnormal vaginal discharge.  Abnormal uterine bleeding.   Unusual pain shortly after finishing your period. DIAGNOSIS  Your caregiver will choose some of the following methods to make a diagnosis, such as:   Performinga physical exam and history. A pelvic exam typically reveals a very tender uterus and surrounding pelvis.   Ordering laboratory tests including a pregnancy test, blood tests, and urine test.  Orderingcultures of the vagina and cervix to check for a sexually transmitted infection (STI).  Performing an ultrasound.   Performing a laparoscopic procedure to look inside the pelvis.  TREATMENT   Antibiotic medicines may be prescribed and taken by mouth.   Sexual partners may be treated when the infection is caused by a sexually transmitted disease (STD).   Hospitalization may be needed to give antibiotics intravenously.  Surgery may be needed, but this is rare. It may take weeks until you are completely well. If you are diagnosed with PID, you should also be checked for human immunodeficiency virus (HIV). HOME CARE INSTRUCTIONS   If given, take your antibiotics as directed. Finish the medicine even if you start to feel better.   Only take over-the-counter or prescription medicines for pain, discomfort, or fever as directed by your caregiver.   Do not have sexual intercourse until treatment is completed or as directed by your caregiver. If PID is confirmed, your recent sexual partner(s) will need treatment.   Keep your follow-up appointments. SEEK MEDICAL CARE IF:   You have increased or abnormal vaginal discharge.   You need prescription medicine for your pain.   You vomit.   You cannot take your medicines.   Your partner has an STD.  SEEK IMMEDIATE MEDICAL CARE IF:   You have a fever.   You have increased abdominal or pelvic pain.   You have chills.   You have pain when you urinate.   You are not better after 72  hours following treatment.  MAKE SURE YOU:   Understand these instructions.  Will watch your condition.  Will get help right away if you are not doing well or get worse. Document Released: 11/20/2005 Document Revised: 03/17/2013 Document Reviewed: 11/16/2011 [facility] Patient Information 2015 [place], [place]. This information is not intended to replace advice given to you by your health care provider. Make sure you discuss any questions you have with your health care provider.

## 2015-04-26 LAB — GC/CHLAMYDIA PROBE AMP (~~LOC~~) NOT AT ARMC
Chlamydia: POSITIVE — AB
Neisseria Gonorrhea: POSITIVE — AB

## 2015-04-28 ENCOUNTER — Telehealth (HOSPITAL_COMMUNITY): Payer: Self-pay

## 2015-04-28 NOTE — ED Notes (Addendum)
Positive for gonorrhea and chlamydia- treated per protocol. Pt has already been informed and instructed DHHS form faxed

## 2015-06-25 ENCOUNTER — Emergency Department (HOSPITAL_COMMUNITY)
Admission: EM | Admit: 2015-06-25 | Discharge: 2015-06-25 | Disposition: A | Discharge status: Home / Self Care | Payer: No Typology Code available for payment source | Attending: Emergency Medicine | Admitting: Emergency Medicine

## 2015-06-25 ENCOUNTER — Encounter (HOSPITAL_COMMUNITY): Payer: Self-pay

## 2015-06-25 DIAGNOSIS — Y9389 Activity, other specified: Secondary | ICD-10-CM | POA: Diagnosis not present

## 2015-06-25 DIAGNOSIS — M62830 Muscle spasm of back: Secondary | ICD-10-CM | POA: Diagnosis not present

## 2015-06-25 DIAGNOSIS — Z87440 Personal history of urinary (tract) infections: Secondary | ICD-10-CM | POA: Diagnosis not present

## 2015-06-25 DIAGNOSIS — Z8619 Personal history of other infectious and parasitic diseases: Secondary | ICD-10-CM | POA: Insufficient documentation

## 2015-06-25 DIAGNOSIS — Z720 Tobacco use: Secondary | ICD-10-CM | POA: Diagnosis not present

## 2015-06-25 DIAGNOSIS — S3992XA Unspecified injury of lower back, initial encounter: Secondary | ICD-10-CM | POA: Insufficient documentation

## 2015-06-25 DIAGNOSIS — Y998 Other external cause status: Secondary | ICD-10-CM | POA: Insufficient documentation

## 2015-06-25 DIAGNOSIS — J45909 Unspecified asthma, uncomplicated: Secondary | ICD-10-CM | POA: Insufficient documentation

## 2015-06-25 DIAGNOSIS — S0990XA Unspecified injury of head, initial encounter: Secondary | ICD-10-CM | POA: Insufficient documentation

## 2015-06-25 DIAGNOSIS — Y92410 Unspecified street and highway as the place of occurrence of the external cause: Secondary | ICD-10-CM | POA: Diagnosis not present

## 2015-06-25 DIAGNOSIS — F329 Major depressive disorder, single episode, unspecified: Secondary | ICD-10-CM | POA: Insufficient documentation

## 2015-06-25 DIAGNOSIS — Z792 Long term (current) use of antibiotics: Secondary | ICD-10-CM | POA: Diagnosis not present

## 2015-06-25 DIAGNOSIS — M545 Low back pain, unspecified: Secondary | ICD-10-CM

## 2015-06-25 DIAGNOSIS — F419 Anxiety disorder, unspecified: Secondary | ICD-10-CM | POA: Diagnosis not present

## 2015-06-25 MED ORDER — NAPROXEN 500 MG PO TABS: 500.0000 mg | ORAL_TABLET | Freq: Two times a day (BID) | ORAL | Status: DC | PRN

## 2015-06-25 MED ORDER — CYCLOBENZAPRINE HCL 10 MG PO TABS: 10.0000 mg | ORAL_TABLET | Freq: Three times a day (TID) | ORAL | Status: DC | PRN
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
## 2015-09-20 ENCOUNTER — Emergency Department (HOSPITAL_COMMUNITY)
Admission: EM | Admit: 2015-09-20 | Discharge: 2015-09-20 | Disposition: A | Discharge status: Home / Self Care | Payer: Self-pay | Attending: Emergency Medicine | Admitting: Emergency Medicine

## 2015-09-20 ENCOUNTER — Encounter (HOSPITAL_COMMUNITY): Payer: Self-pay

## 2015-09-20 DIAGNOSIS — Z3202 Encounter for pregnancy test, result negative: Secondary | ICD-10-CM | POA: Insufficient documentation

## 2015-09-20 DIAGNOSIS — Z720 Tobacco use: Secondary | ICD-10-CM | POA: Insufficient documentation

## 2015-09-20 DIAGNOSIS — J45909 Unspecified asthma, uncomplicated: Secondary | ICD-10-CM | POA: Insufficient documentation

## 2015-09-20 DIAGNOSIS — F419 Anxiety disorder, unspecified: Secondary | ICD-10-CM | POA: Insufficient documentation

## 2015-09-20 DIAGNOSIS — N730 Acute parametritis and pelvic cellulitis: Secondary | ICD-10-CM | POA: Insufficient documentation

## 2015-09-20 DIAGNOSIS — Z8751 Personal history of pre-term labor: Secondary | ICD-10-CM | POA: Insufficient documentation

## 2015-09-20 DIAGNOSIS — F329 Major depressive disorder, single episode, unspecified: Secondary | ICD-10-CM | POA: Insufficient documentation

## 2015-09-20 DIAGNOSIS — Z8619 Personal history of other infectious and parasitic diseases: Secondary | ICD-10-CM | POA: Insufficient documentation

## 2015-09-20 DIAGNOSIS — Z87440 Personal history of urinary (tract) infections: Secondary | ICD-10-CM | POA: Insufficient documentation

## 2015-09-20 LAB — WET PREP, GENITAL
Trich, Wet Prep: NONE SEEN
Yeast Wet Prep HPF POC: NONE SEEN

## 2015-09-20 LAB — URINALYSIS, ROUTINE W REFLEX MICROSCOPIC
Bilirubin Urine: NEGATIVE
GLUCOSE, UA: NEGATIVE mg/dL
Hgb urine dipstick: NEGATIVE
KETONES UR: NEGATIVE mg/dL
Leukocytes, UA: NEGATIVE
Nitrite: NEGATIVE
PH: 7 (ref 5.0–8.0)
Protein, ur: NEGATIVE mg/dL
SPECIFIC GRAVITY, URINE: 1.023 (ref 1.005–1.030)
Urobilinogen, UA: 0.2 mg/dL (ref 0.0–1.0)

## 2015-09-20 LAB — COMPREHENSIVE METABOLIC PANEL WITH GFR
ALT: 17 U/L (ref 14–54)
AST: 20 U/L (ref 15–41)
Albumin: 3.6 g/dL (ref 3.5–5.0)
Alkaline Phosphatase: 42 U/L (ref 38–126)
Anion gap: 4 — ABNORMAL LOW (ref 5–15)
BUN: 11 mg/dL (ref 6–20)
CO2: 29 mmol/L (ref 22–32)
Calcium: 9.1 mg/dL (ref 8.9–10.3)
Chloride: 106 mmol/L (ref 101–111)
Creatinine, Ser: 0.86 mg/dL (ref 0.44–1.00)
GFR calc Af Amer: >60 mL/min
GFR calc non Af Amer: >60 mL/min
Glucose, Bld: 95 mg/dL (ref 65–99)
Potassium: 3.8 mmol/L (ref 3.5–5.1)
Sodium: 139 mmol/L (ref 135–145)
Total Bilirubin: 0.3 mg/dL (ref 0.3–1.2)
Total Protein: 5.9 g/dL — ABNORMAL LOW (ref 6.5–8.1)

## 2015-09-20 LAB — CBC WITH DIFFERENTIAL/PLATELET
Basophils Absolute: 0 K/uL (ref 0.0–0.1)
Basophils Relative: 0 %
Eosinophils Absolute: 0.2 K/uL (ref 0.0–0.7)
Eosinophils Relative: 3 %
HCT: 35.1 % — ABNORMAL LOW (ref 36.0–46.0)
Hemoglobin: 11.5 g/dL — ABNORMAL LOW (ref 12.0–15.0)
Lymphocytes Relative: 31 %
Lymphs Abs: 2.3 K/uL (ref 0.7–4.0)
MCH: 29.2 pg (ref 26.0–34.0)
MCHC: 32.8 g/dL (ref 30.0–36.0)
MCV: 89.1 fL (ref 78.0–100.0)
Monocytes Absolute: 0.5 K/uL (ref 0.1–1.0)
Monocytes Relative: 6 %
Neutro Abs: 4.6 K/uL (ref 1.7–7.7)
Neutrophils Relative %: 60 %
Platelets: 147 K/uL — ABNORMAL LOW (ref 150–400)
RBC: 3.94 MIL/uL (ref 3.87–5.11)
RDW: 13.2 % (ref 11.5–15.5)
WBC: 7.6 K/uL (ref 4.0–10.5)

## 2015-09-20 LAB — LIPASE, BLOOD: Lipase: 78 U/L — ABNORMAL HIGH (ref 22–51)

## 2015-09-20 LAB — POC URINE PREG, ED: Preg Test, Ur: NEGATIVE

## 2015-09-20 MED ORDER — HYDROCODONE-ACETAMINOPHEN 5-325 MG PO TABS: 1.0000 | ORAL_TABLET | ORAL | Status: DC | PRN

## 2015-09-20 MED ORDER — ONDANSETRON HCL 4 MG/2ML IJ SOLN
4.0000 mg | Freq: Once | INTRAMUSCULAR | Status: AC
  Administered 2015-09-20: 4 mg via INTRAVENOUS
  Filled 2015-09-20: qty 2

## 2015-09-20 MED ORDER — KETOROLAC TROMETHAMINE 30 MG/ML IJ SOLN
30.0000 mg | Freq: Once | INTRAMUSCULAR | Status: AC
  Administered 2015-09-20: 30 mg via INTRAVENOUS
  Filled 2015-09-20: qty 1

## 2015-09-20 MED ORDER — SODIUM CHLORIDE 0.9 % IV SOLN
1000.0000 mL | INTRAVENOUS | Status: DC
  Administered 2015-09-20: 1000 mL via INTRAVENOUS

## 2015-09-20 MED ORDER — MORPHINE SULFATE (PF) 4 MG/ML IV SOLN
4.0000 mg | Freq: Once | INTRAVENOUS | Status: AC
  Administered 2015-09-20: 4 mg via INTRAVENOUS
  Filled 2015-09-20: qty 1

## 2015-09-20 MED ORDER — NAPROXEN 500 MG PO TABS: 500.0000 mg | ORAL_TABLET | Freq: Two times a day (BID) | ORAL | Status: DC | PRN

## 2015-09-20 MED ORDER — LIDOCAINE HCL (PF) 1 % IJ SOLN
INTRAMUSCULAR | Status: AC
  Administered 2015-09-20: 2.1 mL
  Filled 2015-09-20: qty 5

## 2015-09-20 MED ORDER — DOXYCYCLINE HYCLATE 100 MG PO CAPS: 100.0000 mg | ORAL_CAPSULE | Freq: Two times a day (BID) | ORAL | Status: DC

## 2015-09-20 MED ORDER — CEFTRIAXONE SODIUM 250 MG IJ SOLR
250.0000 mg | Freq: Once | INTRAMUSCULAR | Status: AC
  Administered 2015-09-20: 250 mg via INTRAMUSCULAR
  Filled 2015-09-20: qty 250

## 2015-09-20 MED ORDER — SODIUM CHLORIDE 0.9 % IV SOLN
1000.0000 mL | Freq: Once | INTRAVENOUS | Status: AC
  Administered 2015-09-20: 1000 mL via INTRAVENOUS

## 2015-09-20 NOTE — ED Notes (Signed)
Rt.. Lower abdominal pain radiates into her rt. Flank area. Intermittent nausea but denies any vomiting or diarheea.  Skin is warm and dry.  Pt. Denies any vaginal discharge or odor.

## 2015-09-20 NOTE — ED Notes (Signed)
Pt. s pain is coming back.  Reported to Dr. Lynelle DoctorKnapp

## 2015-09-20 NOTE — ED Provider Notes (Signed)
CSN: [id number]     Arrival date & time 09/20/15  [id number] History   First MD Initiated Contact with Patient 09/20/15 [phone]     Chief Complaint  Patient presents with  . Abdominal Pain   HPI  patient presents to the emergency room with complaints of abdominal pain. Symptoms started this morning. The pain is in the right flank area as well as the right lower abdomen and suprapubic region. Pain is sharp and moderate to severe in severity. She's had some nausea with it. Denies any vomiting. Denies any diarrhea. Denies any vaginal discharge or bleeding. She does feel like she is urinating a bit more frequently but does not have any significant pain necessarily. Past Medical History  Diagnosis Date  . Urinary tract infection   . Chlamydia   . Preterm labor   . UTI in pregnancy   . Asthma   . HSV-2 infection complicating pregnancy   . Normal pregnancy, repeat 01/30/2012  . SVD (spontaneous vaginal delivery) 01/30/2012  . Anxiety   . Depression   . Chlamydia    Past Surgical History  Procedure Laterality Date  . No past surgeries     Family History  Problem Relation Age of Onset  . Anesthesia problems Neg Hx   . Hypotension Neg Hx   . Malignant hyperthermia Neg Hx   . Pseudochol deficiency Neg Hx   . Cancer Father     colon  . Seizures Sister   . Diabetes Maternal Grandmother   . Heart disease Maternal Grandfather   . Heart disease Paternal Grandfather    Social History  Substance Use Topics  . Smoking status: Current Every Day Smoker -- 0.50 packs/day for 5 years    Types: Cigarettes  . Smokeless tobacco: Never Used  . Alcohol Use: Yes     Comment: occasional -  NOT WHILE PREG   OB History    Gravida Para Term Preterm AB TAB SAB Ectopic Multiple Living   6 4 3 1 1  0 1 0 0 4     Review of Systems  All other systems reviewed and are negative.     Allergies  Review of patient's allergies indicates no known allergies.  Home Medications   Prior to Admission medications    Medication Sig Start Date End Date Taking? Authorizing Provider  cyclobenzaprine (FLEXERIL) 10 MG tablet Take 1 tablet (10 mg total) by mouth 3 (three) times daily as needed for muscle spasms. 06/25/15   Mercedes Camprubi-Soms, PA-C  ibuprofen (ADVIL,MOTRIN) 600 MG tablet Take 1 tablet (600 mg total) by mouth every 6 (six) hours as needed. For pain management Patient not taking: Reported on 04/23/2015 11/07/14   Sanjuana KavaAgnes I Nwoko, NP  medroxyPROGESTERone (DEPO-PROVERA) 150 MG/ML injection Inject 1 mL (150 mg total) into the muscle every 3 (three) months. :Birth control method 11/07/14   Sanjuana KavaAgnes I Nwoko, NP  metroNIDAZOLE (FLAGYL) 500 MG tablet Take 1 tablet (500 mg total) by mouth 2 (two) times daily. 04/23/15   Lorre NickAnthony Allen, MD  mirtazapine (REMERON) 30 MG tablet Take 1 tablet (30 mg total) by mouth at bedtime. For depression/sleep Patient not taking: Reported on 04/23/2015 11/07/14   Sanjuana KavaAgnes I Nwoko, NP  Multiple Vitamin (MULTIVITAMIN WITH MINERALS) TABS tablet Take 1 tablet by mouth daily. (This medicine can be purchased from over the counter at yr local pharmacy): For low vitamin Patient not taking: Reported on 04/23/2015 11/07/14   Sanjuana KavaAgnes I Nwoko, NP  naproxen (NAPROSYN) 500 MG tablet Take 1 tablet (  500 mg total) by mouth 2 (two) times daily as needed for mild pain, moderate pain or headache (TAKE WITH MEALS.). 06/25/15   Mercedes Camprubi-Soms, PA-C   There were no vitals taken for this visit. Physical Exam  Constitutional: She appears well-developed and well-nourished. No distress.  HENT:  Head: Normocephalic and atraumatic.  Right Ear: External ear normal.  Left Ear: External ear normal.  Eyes: Conjunctivae are normal. Right eye exhibits no discharge. Left eye exhibits no discharge. No scleral icterus.  Neck: Neck supple. No tracheal deviation present.  Cardiovascular: Normal rate, regular rhythm and intact distal pulses.   Pulmonary/Chest: Effort normal and breath sounds normal. No stridor. No  respiratory distress. She has no wheezes. She has no rales.  Abdominal: Soft. Bowel sounds are normal. She exhibits no distension. There is tenderness in the right upper quadrant, right lower quadrant and epigastric area. There is CVA tenderness ( on right). There is no rebound and no guarding. No hernia. Hernia confirmed negative in the right inguinal area and confirmed negative in the left inguinal area.  Genitourinary: Uterus is tender. Uterus is not enlarged. Cervix exhibits motion tenderness and discharge. Right adnexum displays tenderness. Right adnexum displays no mass and no fullness. Left adnexum displays tenderness. Left adnexum displays no mass and no fullness. No bleeding in the vagina. Vaginal discharge found.  Musculoskeletal: She exhibits no edema or tenderness.  Neurological: She is alert. She has normal strength. No cranial nerve deficit (no facial droop, extraocular movements intact, no slurred speech) or sensory deficit. She exhibits normal muscle tone. She displays no seizure activity. Coordination normal.  Skin: Skin is warm and dry. No rash noted.  Psychiatric: She has a normal mood and affect.  Nursing note and vitals reviewed.   ED Course  Procedures (including critical care time) Labs Review Labs Reviewed  CBC WITH DIFFERENTIAL/PLATELET - Abnormal; Notable for the following:    Hemoglobin 11.5 (*)    HCT 35.1 (*)    Platelets 147 (*)    All other components within normal limits  COMPREHENSIVE METABOLIC PANEL - Abnormal; Notable for the following:    Total Protein 5.9 (*)    Anion gap 4 (*)    All other components within normal limits  LIPASE, BLOOD - Abnormal; Notable for the following:    Lipase 78 (*)    All other components within normal limits  URINALYSIS, ROUTINE W REFLEX MICROSCOPIC (NOT AT [facility]) - Abnormal; Notable for the following:    APPearance HAZY (*)    All other components within normal limits  WET PREP, GENITAL  RPR  HIV ANTIBODY (ROUTINE  TESTING)  POC URINE PREG, ED  GC/CHLAMYDIA PROBE AMP ([place]) NOT AT [facility]     MDM   Final diagnoses:  PID (acute pelvic inflammatory disease)    Patient's laboratory tests are unremarkable. She does have a slight increase in her lipase but I doubt that her symptoms are related to pancreatitis. Urinalysis does not suggest urinary tract infection. Patient has significant tenderness on pelvic exam. I suspect she has pelvic inflammatory disease. Plan on treatment with ceftriaxone and doxycycline. Follow up with a gynecologist. Signs and precautions were discussed    Linwood Dibbles, MD 09/20/15 1118

## 2015-09-20 NOTE — Discharge Instructions (Signed)

## 2015-09-21 LAB — RPR: RPR Ser Ql: NONREACTIVE

## 2015-09-21 LAB — GC/CHLAMYDIA PROBE AMP (~~LOC~~) NOT AT ARMC
Chlamydia: NEGATIVE
Neisseria Gonorrhea: NEGATIVE

## 2015-09-21 LAB — HIV ANTIBODY (ROUTINE TESTING W REFLEX): HIV Screen 4th Generation wRfx: NONREACTIVE

## 2015-10-14 ENCOUNTER — Encounter: Payer: Self-pay | Admitting: Family Medicine

## 2015-10-26 ENCOUNTER — Encounter (HOSPITAL_COMMUNITY): Payer: Self-pay | Admitting: Emergency Medicine

## 2015-10-26 ENCOUNTER — Emergency Department (HOSPITAL_COMMUNITY): Payer: Self-pay

## 2015-10-26 ENCOUNTER — Emergency Department (HOSPITAL_COMMUNITY)
Admission: EM | Admit: 2015-10-26 | Discharge: 2015-10-26 | Disposition: A | Discharge status: Home / Self Care | Payer: Self-pay | Attending: Emergency Medicine | Admitting: Emergency Medicine

## 2015-10-26 DIAGNOSIS — F329 Major depressive disorder, single episode, unspecified: Secondary | ICD-10-CM | POA: Insufficient documentation

## 2015-10-26 DIAGNOSIS — Z87440 Personal history of urinary (tract) infections: Secondary | ICD-10-CM | POA: Insufficient documentation

## 2015-10-26 DIAGNOSIS — R102 Pelvic and perineal pain: Secondary | ICD-10-CM | POA: Insufficient documentation

## 2015-10-26 DIAGNOSIS — Z8751 Personal history of pre-term labor: Secondary | ICD-10-CM | POA: Insufficient documentation

## 2015-10-26 DIAGNOSIS — J45909 Unspecified asthma, uncomplicated: Secondary | ICD-10-CM | POA: Insufficient documentation

## 2015-10-26 DIAGNOSIS — Z3202 Encounter for pregnancy test, result negative: Secondary | ICD-10-CM | POA: Insufficient documentation

## 2015-10-26 DIAGNOSIS — F17210 Nicotine dependence, cigarettes, uncomplicated: Secondary | ICD-10-CM | POA: Insufficient documentation

## 2015-10-26 DIAGNOSIS — F419 Anxiety disorder, unspecified: Secondary | ICD-10-CM | POA: Insufficient documentation

## 2015-10-26 DIAGNOSIS — Z8619 Personal history of other infectious and parasitic diseases: Secondary | ICD-10-CM | POA: Insufficient documentation

## 2015-10-26 LAB — COMPREHENSIVE METABOLIC PANEL
ALT: 16 U/L (ref 14–54)
ANION GAP: 5 (ref 5–15)
AST: 22 U/L (ref 15–41)
Albumin: 3.9 g/dL (ref 3.5–5.0)
Alkaline Phosphatase: 40 U/L (ref 38–126)
BUN: 6 mg/dL (ref 6–20)
CALCIUM: 9.3 mg/dL (ref 8.9–10.3)
CHLORIDE: 107 mmol/L (ref 101–111)
CO2: 27 mmol/L (ref 22–32)
CREATININE: 0.85 mg/dL (ref 0.44–1.00)
GFR calc Af Amer: >60 mL/min (ref >60)
GFR calc non Af Amer: >60 mL/min (ref >60)
Glucose, Bld: 93 mg/dL (ref 65–99)
Potassium: 3.6 mmol/L (ref 3.5–5.1)
SODIUM: 139 mmol/L (ref 135–145)
Total Bilirubin: 0.8 mg/dL (ref 0.3–1.2)
Total Protein: 6.2 g/dL — ABNORMAL LOW (ref 6.5–8.1)

## 2015-10-26 LAB — URINALYSIS, ROUTINE W REFLEX MICROSCOPIC
BILIRUBIN URINE: NEGATIVE
Glucose, UA: NEGATIVE mg/dL
HGB URINE DIPSTICK: NEGATIVE
Ketones, ur: NEGATIVE mg/dL
Nitrite: NEGATIVE
PROTEIN: NEGATIVE mg/dL
Specific Gravity, Urine: 1.01 (ref 1.005–1.030)
pH: 7.5 (ref 5.0–8.0)

## 2015-10-26 LAB — URINE MICROSCOPIC-ADD ON

## 2015-10-26 LAB — CBC
HCT: 34.6 % — ABNORMAL LOW (ref 36.0–46.0)
Hemoglobin: 11.3 g/dL — ABNORMAL LOW (ref 12.0–15.0)
MCH: 29.2 pg (ref 26.0–34.0)
MCHC: 32.7 g/dL (ref 30.0–36.0)
MCV: 89.4 fL (ref 78.0–100.0)
PLATELETS: 120 10*3/uL — AB (ref 150–400)
RBC: 3.87 MIL/uL (ref 3.87–5.11)
RDW: 13.8 % (ref 11.5–15.5)
WBC: 5.7 10*3/uL (ref 4.0–10.5)

## 2015-10-26 LAB — POC URINE PREG, ED: PREG TEST UR: NEGATIVE

## 2015-10-26 LAB — LIPASE, BLOOD: LIPASE: 27 U/L (ref 11–51)

## 2015-10-26 MED ORDER — HYDROCODONE-ACETAMINOPHEN 5-325 MG PO TABS: 1.0000 | ORAL_TABLET | Freq: Four times a day (QID) | ORAL | Status: DC | PRN

## 2015-10-26 MED ORDER — IBUPROFEN 800 MG PO TABS: 800.0000 mg | ORAL_TABLET | Freq: Three times a day (TID) | ORAL | Status: DC | PRN

## 2015-10-26 MED ORDER — METRONIDAZOLE 500 MG PO TABS: 500.0000 mg | ORAL_TABLET | Freq: Two times a day (BID) | ORAL | Status: DC

## 2015-10-26 NOTE — Discharge Instructions (Signed)
Return here as needed. Follow up with the clinic provided. Your Ultrasound was normal.

## 2015-10-26 NOTE — ED Notes (Signed)
Pt reports being treated for pelvic inflammatory disease at the beginning of November after finishing antibiotics pain is no better. Pt reports pain in right lower abd and into lower abd. Pt also reports a clear discharge. Denies any v/d.

## 2015-10-26 NOTE — ED Provider Notes (Signed)
CSN: [id number]     Arrival date & time 10/26/15  1220 History   First MD Initiated Contact with Patient 10/26/15 1300     Chief Complaint  Patient presents with  . Abdominal Pain     (Consider location/radiation/quality/duration/timing/severity/associated sxs/prior Treatment) HPI Patient presents to the emergency department with lower pelvic pain that started at the beginning of November.  The patient was seen in the emergency department and treated for PID.  Patient states the pain got somewhat better, but never totally resolved.  Patient states that she finished on the antibiotics.  Patient states she did continue to notice clear discharge vaginally.  Patient states that she did not follow-up following her previous ED visit.  Patient states that the pain medication did help with her pain.  Patient states that seems make her condition worse.  Patient denies chest pain, shortness breath, nausea, vomiting, fever, weakness, dizziness, headache, blurred vision, back pain, dysuria, hematuria, incontinence, bloody stool or syncope Past Medical History  Diagnosis Date  . Urinary tract infection   . Chlamydia   . Preterm labor   . UTI in pregnancy   . Asthma   . HSV-2 infection complicating pregnancy   . Normal pregnancy, repeat 01/30/2012  . SVD (spontaneous vaginal delivery) 01/30/2012  . Anxiety   . Depression   . Chlamydia    Past Surgical History  Procedure Laterality Date  . No past surgeries     Family History  Problem Relation Age of Onset  . Anesthesia problems Neg Hx   . Hypotension Neg Hx   . Malignant hyperthermia Neg Hx   . Pseudochol deficiency Neg Hx   . Cancer Father     colon  . Seizures Sister   . Diabetes Maternal Grandmother   . Heart disease Maternal Grandfather   . Heart disease Paternal Grandfather    Social History  Substance Use Topics  . Smoking status: Current Every Day Smoker -- 0.50 packs/day for 5 years    Types: Cigarettes  . Smokeless tobacco:  Never Used  . Alcohol Use: Yes     Comment: occasional -  NOT WHILE PREG   OB History    Gravida Para Term Preterm AB TAB SAB Ectopic Multiple Living   0 1 0 0 4     Review of Systems All other systems negative except as documented in the HPI. All pertinent positives and negatives as reviewed in the HPI.   Allergies  Review of patient's allergies indicates no known allergies.  Home Medications   Prior to Admission medications   Medication Sig Start Date End Date Taking? Authorizing Provider  cyclobenzaprine (FLEXERIL) 10 MG tablet Take 1 tablet (10 mg total) by mouth 3 (three) times daily as needed for muscle spasms. Patient not taking: Reported on 09/20/2015 06/25/15   Mercedes Camprubi-Soms, PA-C  doxycycline (VIBRAMYCIN) 100 MG capsule Take 1 capsule (100 mg total) by mouth 2 (two) times daily. Patient not taking: Reported on 10/26/2015 09/20/15   Linwood Dibbles, MD  HYDROcodone-acetaminophen (NORCO/VICODIN) 5-325 MG tablet Take 1 tablet by mouth every 4 (four) hours as needed. Patient not taking: Reported on 10/26/2015 09/20/15   Linwood Dibbles, MD  ibuprofen (ADVIL,MOTRIN) 600 MG tablet Take 1 tablet (600 mg total) by mouth every 6 (six) hours as needed. For pain management Patient not taking: Reported on 04/23/2015 11/07/14   Sanjuana Kava, NP  medroxyPROGESTERone (DEPO-PROVERA) 150 MG/ML injection Inject 1 mL (150 mg total) into the muscle  every 3 (three) months. :Birth control method Patient not taking: Reported on 09/20/2015 11/07/14   Sanjuana KavaAgnes I Nwoko, NP  mirtazapine (REMERON) 30 MG tablet Take 1 tablet (30 mg total) by mouth at bedtime. For depression/sleep Patient not taking: Reported on 04/23/2015 11/07/14   Sanjuana KavaAgnes I Nwoko, NP  Multiple Vitamin (MULTIVITAMIN WITH MINERALS) TABS tablet Take 1 tablet by mouth daily. (This medicine can be purchased from over the counter at yr local pharmacy): For low vitamin Patient not taking: Reported on 04/23/2015 11/07/14   Sanjuana KavaAgnes I Nwoko, NP   naproxen (NAPROSYN) 500 MG tablet Take 1 tablet (500 mg total) by mouth 2 (two) times daily as needed for mild pain, moderate pain or headache (TAKE WITH MEALS.). Patient not taking: Reported on 10/26/2015 09/20/15   Linwood DibblesJon Knapp, MD   BP 137/70 mmHg  Pulse 60  Temp(Src) 98 F (36.7 C) (Oral)  Resp 16  SpO2 100%  LMP 10/03/2015 Physical Exam  Constitutional: She is oriented to person, place, and time. She appears well-developed and well-nourished. No distress.  HENT:  Head: Normocephalic and atraumatic.  Mouth/Throat: Oropharynx is clear and moist.  Eyes: Pupils are equal, round, and reactive to light.  Neck: Normal range of motion. Neck supple.  Cardiovascular: Normal rate, regular rhythm and normal heart sounds.  Exam reveals no gallop and no friction rub.   No murmur heard. Pulmonary/Chest: Effort normal and breath sounds normal. No respiratory distress. She has no wheezes.  Abdominal: Soft. Bowel sounds are normal. She exhibits no distension. There is no tenderness.  Neurological: She is alert and oriented to person, place, and time. She exhibits normal muscle tone. Coordination normal.  Skin: Skin is warm and dry. No rash noted. No erythema.  Psychiatric: She has a normal mood and affect. Her behavior is normal.  Nursing note and vitals reviewed.   ED Course  Procedures (including critical care time) Labs Review Labs Reviewed  COMPREHENSIVE METABOLIC PANEL - Abnormal; Notable for the following:    Total Protein 6.2 (*)    All other components within normal limits  CBC - Abnormal; Notable for the following:    Hemoglobin 11.3 (*)    HCT 34.6 (*)    Platelets 120 (*)    All other components within normal limits  URINALYSIS, ROUTINE W REFLEX MICROSCOPIC (NOT AT [facility]) - Abnormal; Notable for the following:    APPearance CLOUDY (*)    Leukocytes, UA TRACE (*)    All other components within normal limits  URINE MICROSCOPIC-ADD ON - Abnormal; Notable for the following:     Squamous Epithelial / LPF 6-30 (*)    Bacteria, UA RARE (*)    All other components within normal limits  LIPASE, BLOOD  POC URINE PREG, ED    Imaging Review [place] Transvaginal Non-ob  10/26/2015  CLINICAL DATA:  Generalized pelvic pain for greater than 2 weeks. EXAM: TRANSABDOMINAL AND TRANSVAGINAL ULTRASOUND OF PELVIS DOPPLER ULTRASOUND OF OVARIES TECHNIQUE: Both transabdominal and transvaginal ultrasound examinations of the pelvis were performed. Transabdominal technique was performed for global imaging of the pelvis including uterus, ovaries, adnexal regions, and pelvic cul-de-sac. It was necessary to proceed with endovaginal exam following the transabdominal exam to visualize the endometrium and adnexa. Color and duplex Doppler ultrasound was utilized to evaluate blood flow to the ovaries. COMPARISON:  04/23/2015 FINDINGS: Uterus Measurements: 10 x 6 x 6 cm. No fibroids or other mass visualized. Endometrium Thickness: 15 mm. Thickness was previously 7 mm and current thickness is likely physiologic. No  focal abnormality visualized. Right ovary Measurements: 26 x 15 x 29 mm. Normal appearance/no adnexal mass. Left ovary Measurements: 32 x 16 x 18 mm. Normal appearance/no adnexal mass. Pulsed Doppler evaluation of both ovaries demonstrates normal low-resistance arterial and venous waveforms. Other findings Small volume pelvic fluid with few particles but no complexity typical of clot. IMPRESSION: 1. No acute finding.  Normal appearance of the ovaries. 2. Small pelvic fluid. Electronically Signed   By: Marnee Spring M.D.   On: 10/26/2015 15:49   [place] Art/ven Flow Abd Pelv Doppler  10/26/2015  CLINICAL DATA:  Generalized pelvic pain for greater than 2 weeks. EXAM: TRANSABDOMINAL AND TRANSVAGINAL ULTRASOUND OF PELVIS DOPPLER ULTRASOUND OF OVARIES TECHNIQUE: Both transabdominal and transvaginal ultrasound examinations of the pelvis were performed. Transabdominal technique was performed for global imaging of  the pelvis including uterus, ovaries, adnexal regions, and pelvic cul-de-sac. It was necessary to proceed with endovaginal exam following the transabdominal exam to visualize the endometrium and adnexa. Color and duplex Doppler ultrasound was utilized to evaluate blood flow to the ovaries. COMPARISON:  04/23/2015 FINDINGS: Uterus Measurements: 10 x 6 x 6 cm. No fibroids or other mass visualized. Endometrium Thickness: 15 mm. Thickness was previously 7 mm and current thickness is likely physiologic. No focal abnormality visualized. Right ovary Measurements: 26 x 15 x 29 mm. Normal appearance/no adnexal mass. Left ovary Measurements: 32 x 16 x 18 mm. Normal appearance/no adnexal mass. Pulsed Doppler evaluation of both ovaries demonstrates normal low-resistance arterial and venous waveforms. Other findings Small volume pelvic fluid with few particles but no complexity typical of clot. IMPRESSION: 1. No acute finding.  Normal appearance of the ovaries. 2. Small pelvic fluid. Electronically Signed   By: Marnee Spring M.D.   On: 10/26/2015 15:49   I have personally reviewed and evaluated these images and lab results as part of my medical decision-making.   EKG Interpretation None      MDM   Final diagnoses:  Pelvic pain in female    Patient had recent pelvic exam and deferred at this time.  We did do the ultrasound which did not show any significant abnormalities.  The patient will be treated with Flagyl as well because she was complaining that she has had bacterial vaginosis and this feels similar.  Patient will be referred to OB/GYN for further evaluation and care.  Told to return here as needed    Charlestine Night, PA-C 10/27/15 2351  Lyndal Pulley, MD 10/28/15 2208

## 2015-11-19 ENCOUNTER — Encounter: Payer: Self-pay | Admitting: Obstetrics & Gynecology

## 2016-01-26 ENCOUNTER — Emergency Department (HOSPITAL_COMMUNITY)
Admission: EM | Admit: 2016-01-26 | Discharge: 2016-01-27 | Disposition: A | Discharge status: Home / Self Care | Payer: No Typology Code available for payment source

## 2016-01-26 NOTE — ED Notes (Signed)
While performing vitals patient stated that she wants to go ahead and leave "because I know how my attitude gets if I have to wait and I saw at the desk that it's over a 2 hour wait and I'm not waiting. I'm just going to go home."; Lawson Fiscal, [place] aware

## 2016-01-27 ENCOUNTER — Inpatient Hospital Stay (HOSPITAL_COMMUNITY): Payer: Medicaid Other

## 2016-01-27 ENCOUNTER — Inpatient Hospital Stay (HOSPITAL_COMMUNITY)
Admission: AD | Admit: 2016-01-27 | Discharge: 2016-01-27 | Disposition: A | Discharge status: Home / Self Care | Payer: Medicaid Other | Source: Ambulatory Visit | Attending: Obstetrics & Gynecology | Admitting: Obstetrics & Gynecology

## 2016-01-27 ENCOUNTER — Encounter (HOSPITAL_COMMUNITY): Payer: Self-pay | Admitting: *Deleted

## 2016-01-27 DIAGNOSIS — B9689 Other specified bacterial agents as the cause of diseases classified elsewhere: Secondary | ICD-10-CM

## 2016-01-27 DIAGNOSIS — O26899 Other specified pregnancy related conditions, unspecified trimester: Secondary | ICD-10-CM

## 2016-01-27 DIAGNOSIS — O26891 Other specified pregnancy related conditions, first trimester: Secondary | ICD-10-CM | POA: Insufficient documentation

## 2016-01-27 DIAGNOSIS — R102 Pelvic and perineal pain: Secondary | ICD-10-CM | POA: Insufficient documentation

## 2016-01-27 DIAGNOSIS — F17210 Nicotine dependence, cigarettes, uncomplicated: Secondary | ICD-10-CM | POA: Insufficient documentation

## 2016-01-27 DIAGNOSIS — N760 Acute vaginitis: Secondary | ICD-10-CM | POA: Insufficient documentation

## 2016-01-27 DIAGNOSIS — M549 Dorsalgia, unspecified: Secondary | ICD-10-CM | POA: Diagnosis not present

## 2016-01-27 DIAGNOSIS — R109 Unspecified abdominal pain: Secondary | ICD-10-CM

## 2016-01-27 DIAGNOSIS — Z3A01 Less than 8 weeks gestation of pregnancy: Secondary | ICD-10-CM | POA: Insufficient documentation

## 2016-01-27 DIAGNOSIS — O23591 Infection of other part of genital tract in pregnancy, first trimester: Secondary | ICD-10-CM

## 2016-01-27 DIAGNOSIS — O9989 Other specified diseases and conditions complicating pregnancy, childbirth and the puerperium: Secondary | ICD-10-CM

## 2016-01-27 DIAGNOSIS — A499 Bacterial infection, unspecified: Secondary | ICD-10-CM

## 2016-01-27 LAB — URINALYSIS, ROUTINE W REFLEX MICROSCOPIC
BILIRUBIN URINE: NEGATIVE
GLUCOSE, UA: NEGATIVE mg/dL
HGB URINE DIPSTICK: NEGATIVE
KETONES UR: 15 mg/dL — AB
LEUKOCYTES UA: NEGATIVE
Nitrite: NEGATIVE
PH: 6.5 (ref 5.0–8.0)
PROTEIN: NEGATIVE mg/dL
Specific Gravity, Urine: 1.015 (ref 1.005–1.030)

## 2016-01-27 LAB — HCG, QUANTITATIVE, PREGNANCY: HCG, BETA CHAIN, QUANT, S: 4680 m[IU]/mL — AB (ref <5)

## 2016-01-27 LAB — CBC
HEMATOCRIT: 31.7 % — AB (ref 36.0–46.0)
HEMOGLOBIN: 10.9 g/dL — AB (ref 12.0–15.0)
MCH: 30 pg (ref 26.0–34.0)
MCHC: 34.4 g/dL (ref 30.0–36.0)
MCV: 87.3 fL (ref 78.0–100.0)
Platelets: 141 10*3/uL — ABNORMAL LOW (ref 150–400)
RBC: 3.63 MIL/uL — AB (ref 3.87–5.11)
RDW: 13.6 % (ref 11.5–15.5)
WBC: 8.1 10*3/uL (ref 4.0–10.5)

## 2016-01-27 LAB — WET PREP, GENITAL
Sperm: NONE SEEN
Trich, Wet Prep: NONE SEEN
Yeast Wet Prep HPF POC: NONE SEEN

## 2016-01-27 LAB — POCT PREGNANCY, URINE: PREG TEST UR: POSITIVE — AB

## 2016-01-27 MED ORDER — METRONIDAZOLE 500 MG PO TABS: 500.0000 mg | ORAL_TABLET | Freq: Two times a day (BID) | ORAL | Status: DC

## 2016-01-27 MED ORDER — PRENATAL VITAMINS 0.8 MG PO TABS: 1.0000 | ORAL_TABLET | Freq: Every day | ORAL | Status: DC

## 2016-01-27 NOTE — MAU Note (Addendum)
Has a bad soda problem. Having pain in back (flank- bilateral) for about a wk. Frequency/urgency and a little sting when she urinates.  Neg CVA tenderness. Lower abd pain since this morning. No period this month, some nausea

## 2016-01-27 NOTE — MAU Provider Note (Signed)
History   [id number]   Chief Complaint  Patient presents with  . Abdominal Pain  . Back Pain    HPI Jaime Mendoza is a 25 y.o. female  [phone] here with report of lower pelvic pain that started two weeks ago.  Pain is described as intermittent and sharp and occurs 2-3x/day. Also concerned because found out partner is having sex with someone else and desires to be checked for infection.  +white vaginal discharge with no odor or itching.    Patient's last menstrual period was 12/20/2015.  OB History  Gravida Para Term Preterm AB SAB TAB Ectopic Multiple Living  0 0 0 4    # Outcome Date GA Lbr Len/2nd Weight Sex Delivery Anes PTL Lv  6 Term 06/28/13 [redacted]w[redacted]d 12:34 / 00:15 3.11 kg (6 lb 13.7 oz) F Vag-Spont EPI  Y     Comments: None  5 Term 01/30/12 [redacted]w[redacted]d 03:08 / 00:14 3.345 kg (7 lb 6 oz) M Vag-Spont EPI  Y  4 Preterm 2010 [redacted]w[redacted]d  2.013 kg (4 lb 7 oz) F Vag-Spont EPI  Y  3 SAB 2008          2 Term 2008 [redacted]w[redacted]d 12:00 3.062 kg (6 lb 12 oz) F Vag-Spont EPI  Y  1 Gravida              Comments: System Generated. Please review and update pregnancy details.      Past Medical History  Diagnosis Date  . Urinary tract infection   . Chlamydia   . Preterm labor   . UTI in pregnancy   . Asthma   . HSV-2 infection complicating pregnancy   . Normal pregnancy, repeat 01/30/2012  . SVD (spontaneous vaginal delivery) 01/30/2012  . Anxiety   . Depression   . Chlamydia     Family History  Problem Relation Age of Onset  . Anesthesia problems Neg Hx   . Hypotension Neg Hx   . Malignant hyperthermia Neg Hx   . Pseudochol deficiency Neg Hx   . Cancer Father     colon  . Seizures Sister   . Diabetes Maternal Grandmother   . Heart disease Maternal Grandfather   . Heart disease Paternal Grandfather     Social History   Social History  . Marital Status: Single    Spouse Name: N/A  . Number of Children: N/A  . Years of Education: N/A   Social History Main Topics  . Smoking  status: Current Every Day Smoker -- 0.50 packs/day for 5 years    Types: Cigarettes  . Smokeless tobacco: Never Used  . Alcohol Use: Yes     Comment: occasional -  NOT WHILE PREG  . Drug Use: No     Comment: Positive for THC at admission, denied THC use  . Sexual Activity: Yes    Birth Control/ Protection: None     Comment: depo-provera   Other Topics Concern  . None   Social History Narrative    No Known Allergies  No current facility-administered medications on file prior to encounter.   Current Outpatient Prescriptions on File Prior to Encounter  Medication Sig Dispense Refill  . cyclobenzaprine (FLEXERIL) 10 MG tablet Take 1 tablet (10 mg total) by mouth 3 (three) times daily as needed for muscle spasms. (Patient not taking: Reported on 09/20/2015) 15 tablet 0  . HYDROcodone-acetaminophen (NORCO/VICODIN) 5-325 MG tablet Take 1 tablet by mouth every 6 (six) hours as needed  for moderate pain. 15 tablet 0  . ibuprofen (ADVIL,MOTRIN) 800 MG tablet Take 1 tablet (800 mg total) by mouth every 8 (eight) hours as needed. 21 tablet 0  . medroxyPROGESTERone (DEPO-PROVERA) 150 MG/ML injection Inject 1 mL (150 mg total) into the muscle every 3 (three) months. :Birth control method (Patient not taking: Reported on 09/20/2015) 1 mL   . metroNIDAZOLE (FLAGYL) 500 MG tablet Take 1 tablet (500 mg total) by mouth 2 (two) times daily. 14 tablet 0  . mirtazapine (REMERON) 30 MG tablet Take 1 tablet (30 mg total) by mouth at bedtime. For depression/sleep (Patient not taking: Reported on 04/23/2015) 30 tablet 0  . Multiple Vitamin (MULTIVITAMIN WITH MINERALS) TABS tablet Take 1 tablet by mouth daily. (This medicine can be purchased from over the counter at yr local pharmacy): For low vitamin (Patient not taking: Reported on 04/23/2015)    . naproxen (NAPROSYN) 500 MG tablet Take 1 tablet (500 mg total) by mouth 2 (two) times daily as needed for mild pain, moderate pain or headache (TAKE WITH MEALS.).  (Patient not taking: Reported on 10/26/2015) 20 tablet 0     Review of Systems  Constitutional: Negative for fever and chills.  Genitourinary: Positive for vaginal discharge and pelvic pain. Negative for dysuria, urgency, frequency, hematuria and vaginal bleeding.  All other systems reviewed and are negative.    Physical Exam   Filed Vitals:   01/27/16 1829  BP: 121/72  Pulse: 102  Temp: 98.7 F (37.1 C)  TempSrc: Oral  Resp: 18  Height: 5\' 9"  (1.753 m)  Weight: 53.252 kg (117 lb 6.4 oz)    Physical Exam  Constitutional: She is oriented to person, place, and time. She appears well-developed and well-nourished.  HENT:  Head: Normocephalic.  Eyes: Pupils are equal, round, and reactive to light.  Neck: Normal range of motion. Neck supple.  Cardiovascular: Normal rate, regular rhythm and normal heart sounds.   Respiratory: Effort normal and breath sounds normal.  GI: Soft. She exhibits no mass. There is no tenderness. There is no rebound and no guarding.  Genitourinary: There is no rash or lesion on the right labia. There is no rash or lesion on the left labia. Uterus is not enlarged. Cervix exhibits no motion tenderness and no discharge. Vaginal discharge (white, no odor ) found.  Negative cervical motion tenderness  Neurological: She is alert and oriented to person, place, and time. She has normal reflexes.  Skin: Skin is warm and dry.    MAU Course  Procedures  Results for orders placed or performed during the hospital encounter of 01/27/16 (from the past 24 hour(s))  Urinalysis, Routine w reflex microscopic (not at [facility])     Status: Abnormal   Collection Time: 01/27/16  6:35 PM  Result Value Ref Range   Color, Urine YELLOW YELLOW   APPearance CLEAR CLEAR   Specific Gravity, Urine 1.015 1.005 - 1.030   pH 6.5 5.0 - 8.0   Glucose, UA NEGATIVE NEGATIVE mg/dL   Hgb urine dipstick NEGATIVE NEGATIVE   Bilirubin Urine NEGATIVE NEGATIVE   Ketones, ur 15 (A) NEGATIVE  mg/dL   Protein, ur NEGATIVE NEGATIVE mg/dL   Nitrite NEGATIVE NEGATIVE   Leukocytes, UA NEGATIVE NEGATIVE  Wet prep, genital     Status: Abnormal   Collection Time: 01/27/16  7:00 PM  Result Value Ref Range   Yeast Wet Prep HPF POC NONE SEEN NONE SEEN   Trich, Wet Prep NONE SEEN NONE SEEN   Clue Cells  Wet Prep HPF POC PRESENT (A) NONE SEEN   WBC, Wet Prep HPF POC FEW (A) NONE SEEN   Sperm NONE SEEN   Pregnancy, urine POC     Status: Abnormal   Collection Time: 01/27/16  7:25 PM  Result Value Ref Range   Preg Test, Ur POSITIVE (A) NEGATIVE  CBC     Status: Abnormal   Collection Time: 01/27/16  8:00 PM  Result Value Ref Range   WBC 8.1 4.0 - 10.5 K/uL   RBC 3.63 (L) 3.87 - 5.11 MIL/uL   Hemoglobin 10.9 (L) 12.0 - 15.0 g/dL   HCT 16.1 (L) 09.6 - 04.5 %   MCV 87.3 78.0 - 100.0 fL   MCH 30.0 26.0 - 34.0 pg   MCHC 34.4 30.0 - 36.0 g/dL   RDW 40.9 81.1 - 91.4 %   Platelets 141 (L) 150 - 400 K/uL   Ultrasound: FINDINGS: Intrauterine gestational sac: Visualized/normal in shape.  Yolk sac: Not visualized  Embryo: Not visualized  Cardiac Activity: Not visualized  MSD: 6 mm 5 w 2 d  Subchorionic hemorrhage: There is a 6 x 6 mm subchorionic hemorrhage.  Maternal uterus/adnexae: Cervical os is closed. There is no extrauterine pelvic or adnexal mass. No free pelvic fluid. Maternal urinary bladder appears unremarkable.  IMPRESSION: Probable early intrauterine gestational sac, but no yolk sac, fetal pole, or cardiac activity yet visualized. Recommend follow-up quantitative B-HCG levels and follow-up US in 14 days to confirm and assess viability. This recommendation follows SRU consensus guidelines: Diagnostic Criteria for Nonviable Pregnancy Early in the First Trimester. Malva Limes Med 2013; 782:9562-13. Assuming that this sac is a gestational sac, estimated gestational age based on sac size is 5+ weeks. There is a small subchorionic hemorrhage. No extrauterine  pelvic or adnexal mass. No maternal free pelvic fluid.  Assessment and Plan  25 y.o. [id number] at [redacted]w[redacted]d IUP Bacterial Vaginosis Subchorionic Hemorrhage STD Screen  Plan: Discharge to home RX Flagyl GC/CT pending Schedule follow-up ultrasound in 10 days  Marlis Edelson, CNM 01/27/2016 8:48 PM

## 2016-01-27 NOTE — MAU Note (Signed)
Pt left before allowing nurse to take discharge vitals.  Pt got discharge papers.  Pt did not sign the e-signature.

## 2016-01-27 NOTE — Discharge Instructions (Signed)

## 2016-01-28 LAB — GC/CHLAMYDIA PROBE AMP (~~LOC~~) NOT AT ARMC
Chlamydia: NEGATIVE
Neisseria Gonorrhea: NEGATIVE

## 2016-02-01 ENCOUNTER — Telehealth: Payer: Self-pay | Admitting: Family

## 2016-02-01 NOTE — Telephone Encounter (Signed)
Informed patient to come in for [facility]; unable to come today.  Did not schedule for repeat BHCG when in MAU.   Plans to come on 02/02/16.  Ultrasound already scheduled for 02/07/16.

## 2016-02-02 ENCOUNTER — Ambulatory Visit: Payer: Self-pay | Admitting: Obstetrics & Gynecology

## 2016-02-07 ENCOUNTER — Ambulatory Visit (HOSPITAL_COMMUNITY)
Admission: RE | Admit: 2016-02-07 | Discharge: 2016-02-07 | Disposition: A | Discharge status: Home / Self Care | Payer: Medicaid Other | Source: Ambulatory Visit | Attending: Family | Admitting: Family

## 2016-02-07 ENCOUNTER — Ambulatory Visit (INDEPENDENT_AMBULATORY_CARE_PROVIDER_SITE_OTHER): Payer: Medicaid Other | Admitting: Family Medicine

## 2016-02-07 ENCOUNTER — Encounter: Payer: Self-pay | Admitting: Family Medicine

## 2016-02-07 DIAGNOSIS — Z3201 Encounter for pregnancy test, result positive: Secondary | ICD-10-CM | POA: Diagnosis present

## 2016-02-07 DIAGNOSIS — Z36 Encounter for antenatal screening of mother: Secondary | ICD-10-CM | POA: Diagnosis not present

## 2016-02-07 DIAGNOSIS — R109 Unspecified abdominal pain: Secondary | ICD-10-CM

## 2016-02-07 DIAGNOSIS — Z3A01 Less than 8 weeks gestation of pregnancy: Secondary | ICD-10-CM | POA: Insufficient documentation

## 2016-02-07 DIAGNOSIS — O26899 Other specified pregnancy related conditions, unspecified trimester: Secondary | ICD-10-CM

## 2016-02-07 DIAGNOSIS — Z3200 Encounter for pregnancy test, result unknown: Secondary | ICD-10-CM

## 2016-02-07 NOTE — Progress Notes (Signed)
   Subjective:    Patient ID: Jaime Mendoza is a 25 y.o. female presenting with No chief complaint on file.  on 02/07/2016  HPI: Here for f/u u/s after begin seen in MAU with gestational sac only to confirm viability  Review of Systems   Denies bleeding, cramping Objective:    LMP 12/20/2015 (Approximate) Physical Exam Gen - WDWN female, NAD Abd - soft, NT Lungs - normal effort Neck - supple     U/S reviewed - Normal living IUP with normal FHR. Assessment & Plan:   Encounter for confirmation of pregnancy test result with physical examination  [facility] care [facility] PRATT,TANYA S 02/07/2016 12:04 PM

## 2016-02-07 NOTE — Patient Instructions (Signed)
First Trimester of Pregnancy The first trimester of pregnancy is from week 1 until the end of week 12 (months 1 through 3). A week after a sperm fertilizes an egg, the egg will implant on the wall of the uterus. This embryo will begin to develop into a baby. Genes from you and your partner are forming the baby. The female genes determine whether the baby is a boy or a girl. At 6-8 weeks, the eyes and face are formed, and the heartbeat can be seen on ultrasound. At the end of 12 weeks, all the baby's organs are formed.  Now that you are pregnant, you will want to do everything you can to have a healthy baby. Two of the most important things are to get good prenatal care and to follow your health care provider's instructions. Prenatal care is all the medical care you receive before the baby's birth. This care will help prevent, find, and treat any problems during the pregnancy and childbirth. BODY CHANGES Your body goes through many changes during pregnancy. The changes vary from woman to woman.   You may gain or lose a couple of pounds at first.  You may feel sick to your stomach (nauseous) and throw up (vomit). If the vomiting is uncontrollable, call your health care provider.  You may tire easily.  You may develop headaches that can be relieved by medicines approved by your health care provider.  You may urinate more often. Painful urination may mean you have a bladder infection.  You may develop heartburn as a result of your pregnancy.  You may develop constipation because certain hormones are causing the muscles that push waste through your intestines to slow down.  You may develop hemorrhoids or swollen, bulging veins (varicose veins).  Your breasts may begin to grow larger and become tender. Your nipples may stick out more, and the tissue that surrounds them (areola) may become darker.  Your gums may bleed and may be sensitive to brushing and flossing.  Dark spots or blotches (chloasma,  mask of pregnancy) may develop on your face. This will likely fade after the baby is born.  Your menstrual periods will stop.  You may have a loss of appetite.  You may develop cravings for certain kinds of food.  You may have changes in your emotions from day to day, such as being excited to be pregnant or being concerned that something may go wrong with the pregnancy and baby.  You may have more vivid and strange dreams.  You may have changes in your hair. These can include thickening of your hair, rapid growth, and changes in texture. Some women also have hair loss during or after pregnancy, or hair that feels dry or thin. Your hair will most likely return to normal after your baby is born. WHAT TO EXPECT AT YOUR PRENATAL VISITS During a routine prenatal visit:  You will be weighed to make sure you and the baby are growing normally.  Your blood pressure will be taken.  Your abdomen will be measured to track your baby's growth.  The fetal heartbeat will be listened to starting around week 10 or 12 of your pregnancy.  Test results from any previous visits will be discussed. Your health care provider may ask you:  How you are feeling.  If you are feeling the baby move.  If you have had any abnormal symptoms, such as leaking fluid, bleeding, severe headaches, or abdominal cramping.  If you are using any tobacco products,   including cigarettes, chewing tobacco, and electronic cigarettes.  If you have any questions. Other tests that may be performed during your first trimester include:  Blood tests to find your blood type and to check for the presence of any previous infections. They will also be used to check for low iron levels (anemia) and Rh antibodies. Later in the pregnancy, blood tests for diabetes will be done along with other tests if problems develop.  Urine tests to check for infections, diabetes, or protein in the urine.  An ultrasound to confirm the proper growth  and development of the baby.  An amniocentesis to check for possible genetic problems.  Fetal screens for spina bifida and Down syndrome.  You may need other tests to make sure you and the baby are doing well.  HIV (human immunodeficiency virus) testing. Routine prenatal testing includes screening for HIV, unless you choose not to have this test. HOME CARE INSTRUCTIONS  Medicines  Follow your health care provider's instructions regarding medicine use. Specific medicines may be either safe or unsafe to take during pregnancy.  Take your prenatal vitamins as directed.  If you develop constipation, try taking a stool softener if your health care provider approves. Diet  Eat regular, well-balanced meals. Choose a variety of foods, such as meat or vegetable-based protein, fish, milk and low-fat dairy products, vegetables, fruits, and whole grain breads and cereals. Your health care provider will help you determine the amount of weight gain that is right for you.  Avoid raw meat and uncooked cheese. These carry germs that can cause birth defects in the baby.  Eating four or five small meals rather than three large meals a day may help relieve nausea and vomiting. If you start to feel nauseous, eating a few soda crackers can be helpful. Drinking liquids between meals instead of during meals also seems to help nausea and vomiting.  If you develop constipation, eat more high-fiber foods, such as fresh vegetables or fruit and whole grains. Drink enough fluids to keep your urine clear or pale yellow. Activity and Exercise  Exercise only as directed by your health care provider. Exercising will help you:  Control your weight.  Stay in shape.  Be prepared for labor and delivery.  Experiencing pain or cramping in the lower abdomen or low back is a good sign that you should stop exercising. Check with your health care provider before continuing normal exercises.  Try to avoid standing for long  periods of time. Move your legs often if you must stand in one place for a long time.  Avoid heavy lifting.  Wear low-heeled shoes, and practice good posture.  You may continue to have sex unless your health care provider directs you otherwise. Relief of Pain or Discomfort  Wear a good support bra for breast tenderness.   Take warm sitz baths to soothe any pain or discomfort caused by hemorrhoids. Use hemorrhoid cream if your health care provider approves.   Rest with your legs elevated if you have leg cramps or low back pain.  If you develop varicose veins in your legs, wear support hose. Elevate your feet for 15 minutes, 3-4 times a day. Limit salt in your diet. Prenatal Care  Schedule your prenatal visits by the twelfth week of pregnancy. They are usually scheduled monthly at first, then more often in the last 2 months before delivery.  Write down your questions. Take them to your prenatal visits.  Keep all your prenatal visits as directed by your   health care provider. Safety  Wear your seat belt at all times when driving.  Make a list of emergency phone numbers, including numbers for family, friends, the hospital, and police and fire departments. General Tips  Ask your health care provider for a referral to a local prenatal education class. Begin classes no later than at the beginning of month 6 of your pregnancy.  Ask for help if you have counseling or nutritional needs during pregnancy. Your health care provider can offer advice or refer you to specialists for help with various needs.  Do not use hot tubs, steam rooms, or saunas.  Do not douche or use tampons or scented sanitary pads.  Do not cross your legs for long periods of time.  Avoid cat litter boxes and soil used by cats. These carry germs that can cause birth defects in the baby and possibly loss of the fetus by miscarriage or stillbirth.  Avoid all smoking, herbs, alcohol, and medicines not prescribed by  your health care provider. Chemicals in these affect the formation and growth of the baby.  Do not use any tobacco products, including cigarettes, chewing tobacco, and electronic cigarettes. If you need help quitting, ask your health care provider. You may receive counseling support and other resources to help you quit.  Schedule a dentist appointment. At home, brush your teeth with a soft toothbrush and be gentle when you floss. SEEK MEDICAL CARE IF:   You have dizziness.  You have mild pelvic cramps, pelvic pressure, or nagging pain in the abdominal area.  You have persistent nausea, vomiting, or diarrhea.  You have a bad smelling vaginal discharge.  You have pain with urination.  You notice increased swelling in your face, hands, legs, or ankles. SEEK IMMEDIATE MEDICAL CARE IF:   You have a fever.  You are leaking fluid from your vagina.  You have spotting or bleeding from your vagina.  You have severe abdominal cramping or pain.  You have rapid weight gain or loss.  You vomit blood or material that looks like coffee grounds.  You are exposed to German measles and have never had them.  You are exposed to fifth disease or chickenpox.  You develop a severe headache.  You have shortness of breath.  You have any kind of trauma, such as from a fall or a car accident.   This information is not intended to replace advice given to you by your health care provider. Make sure you discuss any questions you have with your health care provider.   Document Released: 11/14/2001 Document Revised: 12/11/2014 Document Reviewed: 09/30/2013 [employer]vier Interactive Patient Education 2016 [employer]  

## 2016-03-08 ENCOUNTER — Ambulatory Visit (INDEPENDENT_AMBULATORY_CARE_PROVIDER_SITE_OTHER): Payer: Medicaid Other | Admitting: Certified Nurse Midwife

## 2016-03-08 ENCOUNTER — Encounter: Payer: Self-pay | Admitting: Certified Nurse Midwife

## 2016-03-08 VITALS — BP 106/68 | HR 80 | Wt 113.0 lb

## 2016-03-08 DIAGNOSIS — O219 Vomiting of pregnancy, unspecified: Secondary | ICD-10-CM | POA: Diagnosis not present

## 2016-03-08 DIAGNOSIS — N760 Acute vaginitis: Secondary | ICD-10-CM

## 2016-03-08 DIAGNOSIS — Z3481 Encounter for supervision of other normal pregnancy, first trimester: Secondary | ICD-10-CM

## 2016-03-08 DIAGNOSIS — B9689 Other specified bacterial agents as the cause of diseases classified elsewhere: Secondary | ICD-10-CM

## 2016-03-08 LAB — POCT URINALYSIS DIPSTICK
BILIRUBIN UA: NEGATIVE
GLUCOSE UA: NEGATIVE
KETONES UA: NEGATIVE
LEUKOCYTES UA: NEGATIVE
Nitrite, UA: NEGATIVE
PROTEIN UA: NEGATIVE
Urobilinogen, UA: NEGATIVE
pH, UA: 7.5

## 2016-03-08 MED ORDER — DOXYLAMINE-PYRIDOXINE 10-10 MG PO TBEC: DELAYED_RELEASE_TABLET | ORAL | Status: DC

## 2016-03-08 MED ORDER — VITAFOL GUMMIES 3.33-0.333-34.8 MG PO CHEW: 3.0000 | CHEWABLE_TABLET | Freq: Every day | ORAL | Status: DC

## 2016-03-08 MED ORDER — METRONIDAZOLE 0.75 % VA GEL: 1.0000 | Freq: Two times a day (BID) | VAGINAL | Status: DC

## 2016-03-08 NOTE — Progress Notes (Signed)
Subjective:    Jaime Mendoza is being seen today for her first obstetrical visit.  This is not a planned pregnancy. She is at 2442w4d gestation. Her obstetrical history is significant for hx of PPROM in a previous pregnancy. Relationship with FOB: in jail, gets out the 28th of April. Patient undecided intend to breast feed. Pregnancy history fully reviewed.  The information documented in the HPI was reviewed and verified.  Menstrual History: OB History    Gravida Para Term Preterm AB TAB SAB Ectopic Multiple Living   7 4 3 1 1  0 1 0 0 4      Menarche age: 25 years of age.    Patient's last menstrual period was 12/20/2015 (approximate).    Past Medical History  Diagnosis Date  . Urinary tract infection   . Chlamydia   . Preterm labor   . UTI in pregnancy   . Asthma   . HSV-2 infection complicating pregnancy   . Normal pregnancy, repeat 01/30/2012  . SVD (spontaneous vaginal delivery) 01/30/2012  . Anxiety   . Depression   . Chlamydia     Past Surgical History  Procedure Laterality Date  . No past surgeries       (Not in a hospital admission) No Known Allergies  Social History  Substance Use Topics  . Smoking status: Current Every Day Smoker -- 0.50 packs/day for 5 years    Types: Cigarettes  . Smokeless tobacco: Never Used  . Alcohol Use: Yes     Comment: occasional -  NOT WHILE PREG    Family History  Problem Relation Age of Onset  . Anesthesia problems Neg Hx   . Hypotension Neg Hx   . Malignant hyperthermia Neg Hx   . Pseudochol deficiency Neg Hx   . Cancer Father     colon  . Seizures Sister   . Diabetes Maternal Grandmother   . Heart disease Maternal Grandfather   . Heart disease Paternal Grandfather      Review of Systems Constitutional: negative for weight loss Gastrointestinal: + for nausea & vomiting Genitourinary:negative for genital lesions and vaginal discharge and dysuria Musculoskeletal:negative for back pain Behavioral/Psych: negative for  abusive relationship, depression, illegal drug usage and tobacco use    Objective:    BP 106/68 mmHg  Pulse 80  Wt 113 lb (51.256 kg)  LMP 12/20/2015 (Approximate) General Appearance:    Alert, cooperative, no distress, appears stated age  Head:    Normocephalic, without obvious abnormality, atraumatic  Eyes:    PERRL, conjunctiva/corneas clear, EOM's intact, fundi    benign, both eyes  Ears:    Normal TM's and external ear canals, both ears  Nose:   Nares normal, septum midline, mucosa normal, no drainage    or sinus tenderness  Throat:   Lips, mucosa, and tongue normal; teeth and gums normal  Neck:   Supple, symmetrical, trachea midline, no adenopathy;    thyroid:  no enlargement/tenderness/nodules; no carotid   bruit or JVD  Back:     Symmetric, no curvature, ROM normal, no CVA tenderness  Lungs:     Clear to auscultation bilaterally, respirations unlabored  Chest Wall:    No tenderness or deformity   Heart:    Regular rate and rhythm, S1 and S2 normal, no murmur, rub   or gallop  Breast Exam:    No tenderness, masses, or nipple abnormality  Abdomen:     Soft, non-tender, bowel sounds active all four quadrants,    no  masses, no organomegaly  Genitalia:    Normal female without lesion, discharge or tenderness  Extremities:   Extremities normal, atraumatic, no cyanosis or edema  Pulses:   2+ and symmetric all extremities  Skin:   Skin color, texture, turgor normal, no rashes or lesions  Lymph nodes:   Cervical, supraclavicular, and axillary nodes normal  Neurologic:   CNII-XII intact, normal strength, sensation and reflexes    throughout         Cervix:  Long, thick, closed and posterior. FHR; seen with bedside US    Lab Review Urine pregnancy test Labs reviewed yes Radiologic studies reviewed yes Assessment:    Pregnancy at [redacted]w[redacted]d weeks   N&V in early pregnancy  Plan:      Prenatal vitamins.  Counseling provided regarding continued use of seat belts, cessation of  alcohol consumption, smoking or use of illicit drugs; infection precautions i.e., influenza/TDAP immunizations, toxoplasmosis,CMV, parvovirus, listeria and varicella; workplace safety, exercise during pregnancy; routine dental care, safe medications, sexual activity, hot tubs, saunas, pools, travel, caffeine use, fish and methlymercury, potential toxins, hair treatments, varicose veins Weight gain recommendations per IOM guidelines reviewed: underweight/BMI< 18.5--> gain 28 - 40 lbs; normal weight/BMI 18.5 - 24.9--> gain 25 - 35 lbs; overweight/BMI 25 - 29.9--> gain 15 - 25 lbs; obese/BMI >30->gain  11 - 20 lbs Problem list reviewed and updated. FIRST/CF mutation testing/NIPT/QUAD SCREEN/fragile X/Ashkenazi Jewish population testing/Spinal muscular atrophy discussed: requested. Role of ultrasound in pregnancy discussed; fetal survey: requested. Amniocentesis discussed: not indicated. VBAC calculator score: VBAC consent form provided Meds ordered this encounter  Medications  . Doxylamine-Pyridoxine (DICLEGIS) 10-10 MG TBEC    Sig: Take 1 tablet with breakfast and lunch.  Take 2 tablets at bedtime.    Dispense:  100 tablet    Refill:  4  . metroNIDAZOLE (METROGEL VAGINAL) 0.75 % vaginal gel    Sig: Place 1 Applicatorful vaginally 2 (two) times daily.    Dispense:  70 g    Refill:  0  . Prenatal Vit-Fe Phos-FA-Omega (VITAFOL GUMMIES) 3.33-0.333-34.8 MG CHEW    Sig: Chew 3 tablets by mouth daily.    Dispense:  90 tablet    Refill:  12   Orders Placed This Encounter  Procedures  . Culture, OB Urine  . HIV antibody  . Hemoglobinopathy evaluation  . Varicella zoster antibody, IgG  . Prenatal Profile I  . VITAMIN D 25 Hydroxy (Vit-D Deficiency, Fractures)  . NuSwab Vaginitis Plus (VG+)  . POCT urinalysis dipstick    Follow up in 4 weeks. 50% of 30 min visit spent on counseling and coordination of care.

## 2016-03-11 LAB — CULTURE, OB URINE

## 2016-03-11 LAB — URINE CULTURE, OB REFLEX

## 2016-03-13 ENCOUNTER — Other Ambulatory Visit: Payer: Self-pay | Admitting: Certified Nurse Midwife

## 2016-03-13 DIAGNOSIS — B3731 Acute candidiasis of vulva and vagina: Secondary | ICD-10-CM

## 2016-03-13 DIAGNOSIS — B373 Candidiasis of vulva and vagina: Secondary | ICD-10-CM

## 2016-03-13 LAB — PRENATAL PROFILE I(LABCORP)
ANTIBODY SCREEN: NEGATIVE
BASOS: 0 %
Basophils Absolute: 0 10*3/uL (ref 0.0–0.2)
EOS (ABSOLUTE): 0.1 10*3/uL (ref 0.0–0.4)
EOS: 1 %
HEP B S AG: NEGATIVE
Hematocrit: 35.6 % (ref 34.0–46.6)
Hemoglobin: 12.2 g/dL (ref 11.1–15.9)
Immature Grans (Abs): 0 10*3/uL (ref 0.0–0.1)
Immature Granulocytes: 0 %
LYMPHS ABS: 1.8 10*3/uL (ref 0.7–3.1)
Lymphs: 32 %
MCH: 30.7 pg (ref 26.6–33.0)
MCHC: 34.3 g/dL (ref 31.5–35.7)
MCV: 90 fL (ref 79–97)
Monocytes Absolute: 0.6 10*3/uL (ref 0.1–0.9)
Monocytes: 10 %
NEUTROS ABS: 3.3 10*3/uL (ref 1.4–7.0)
Neutrophils: 57 %
Platelets: 171 10*3/uL (ref 150–379)
RBC: 3.97 x10E6/uL (ref 3.77–5.28)
RDW: 14.5 % (ref 12.3–15.4)
RH TYPE: POSITIVE
RPR: NONREACTIVE
RUBELLA: 3.84 {index} (ref >0.99)
WBC: 5.7 10*3/uL (ref 3.4–10.8)

## 2016-03-13 LAB — HEMOGLOBINOPATHY EVALUATION
HEMOGLOBIN F QUANTITATION: 0 % (ref 0.0–2.0)
HGB A: 97.5 % (ref 94.0–98.0)
HGB C: 0 %
HGB S: 0 %
Hemoglobin A2 Quantitation: 2.5 % (ref 0.7–3.1)

## 2016-03-13 LAB — NUSWAB VAGINITIS PLUS (VG+)
Atopobium vaginae: HIGH Score — AB
BVAB 2: HIGH Score — AB
CHLAMYDIA TRACHOMATIS, NAA: NEGATIVE
Candida albicans, NAA: POSITIVE — AB
Candida glabrata, NAA: NEGATIVE
MEGASPHAERA 1: HIGH {score} — AB
NEISSERIA GONORRHOEAE, NAA: NEGATIVE
TRICH VAG BY NAA: NEGATIVE

## 2016-03-13 LAB — VARICELLA ZOSTER ANTIBODY, IGG: VARICELLA: 434 {index} (ref >165)

## 2016-03-13 LAB — VITAMIN D 25 HYDROXY (VIT D DEFICIENCY, FRACTURES): Vit D, 25-Hydroxy: 17.5 ng/mL — ABNORMAL LOW (ref 30.0–100.0)

## 2016-03-13 LAB — HIV ANTIBODY (ROUTINE TESTING W REFLEX): HIV Screen 4th Generation wRfx: NONREACTIVE

## 2016-03-13 MED ORDER — TERCONAZOLE 0.4 % VA CREA: 1.0000 | TOPICAL_CREAM | Freq: Every day | VAGINAL | Status: DC

## 2016-03-14 ENCOUNTER — Encounter: Payer: Self-pay | Admitting: *Deleted

## 2016-03-14 LAB — PAP IG W/ RFLX HPV ASCU: PAP SMEAR COMMENT: 0

## 2016-03-19 LAB — HPV GENOTYPES 16/18,45
HPV Genotype 16: NEGATIVE
HPV Genotype 18,45: NEGATIVE

## 2016-03-19 LAB — SPECIMEN STATUS REPORT

## 2016-04-05 ENCOUNTER — Encounter: Payer: Medicaid Other | Admitting: Certified Nurse Midwife

## 2016-04-06 ENCOUNTER — Other Ambulatory Visit: Payer: Self-pay | Admitting: Certified Nurse Midwife

## 2016-04-06 ENCOUNTER — Ambulatory Visit (INDEPENDENT_AMBULATORY_CARE_PROVIDER_SITE_OTHER): Payer: Medicaid Other | Admitting: Certified Nurse Midwife

## 2016-04-06 VITALS — BP 100/63 | HR 94 | Wt 116.0 lb

## 2016-04-06 DIAGNOSIS — Z3482 Encounter for supervision of other normal pregnancy, second trimester: Secondary | ICD-10-CM

## 2016-04-06 DIAGNOSIS — Z3492 Encounter for supervision of normal pregnancy, unspecified, second trimester: Secondary | ICD-10-CM

## 2016-04-06 LAB — POCT URINALYSIS DIPSTICK
BILIRUBIN UA: NEGATIVE
Blood, UA: NEGATIVE
GLUCOSE UA: NEGATIVE
KETONES UA: NEGATIVE
Leukocytes, UA: NEGATIVE
Nitrite, UA: NEGATIVE
Protein, UA: NEGATIVE
SPEC GRAV UA: 1.015
UROBILINOGEN UA: NEGATIVE
pH, UA: 6

## 2016-04-06 NOTE — Progress Notes (Signed)
  Subjective:    Jaime Mendoza is a 25 y.o. female being seen today for her obstetrical visit. She is at 1867w5d gestation. Patient reports: no complaints.  Problem List Items Addressed This Visit    None    Visit Diagnoses    Prenatal care, second trimester    -  Primary    Relevant Orders    POCT urinalysis dipstick (Completed)    Supervision of other normal pregnancy, antepartum, second trimester        Relevant Orders    US OB Comp + 14 Wk      Patient Active Problem List   Diagnosis Date Noted  . Supervision of normal intrauterine pregnancy in multigravida in first trimester 03/08/2016  . MDD (major depressive disorder), single episode 11/08/2014  . Homicidal ideation 11/04/2014  . SVD (spontaneous vaginal delivery) 01/30/2012    Objective:     BP 100/63 mmHg  Pulse 94  Wt 116 lb (52.617 kg)  LMP 12/20/2015 (Approximate) Uterine Size: Below umbilicus   FHR: 145 by doppler  Assessment:    Pregnancy @ 8367w5d  weeks Doing well    Plan:    Problem list reviewed and updated. Labs reviewed.  Follow up in 4 weeks. FIRST/CF mutation testing/NIPT/QUAD SCREEN/fragile X/Ashkenazi Jewish population testing/Spinal muscular atrophy discussed: requested. Role of ultrasound in pregnancy discussed; fetal survey: ordered. Amniocentesis discussed: not indicated. 50% of 15 minute visit spent on counseling and coordination of care.

## 2016-04-06 NOTE — Progress Notes (Signed)
Pt states that she has been having sharp lower pelvic pains.

## 2016-04-27 ENCOUNTER — Encounter (HOSPITAL_COMMUNITY): Payer: Self-pay | Admitting: Certified Nurse Midwife

## 2016-05-03 ENCOUNTER — Encounter (HOSPITAL_COMMUNITY): Payer: Self-pay | Admitting: *Deleted

## 2016-05-03 ENCOUNTER — Inpatient Hospital Stay (HOSPITAL_COMMUNITY)
Admission: AD | Admit: 2016-05-03 | Discharge: 2016-05-03 | Disposition: A | Discharge status: Home / Self Care | Payer: Medicaid Other | Source: Ambulatory Visit | Attending: Obstetrics | Admitting: Obstetrics

## 2016-05-03 DIAGNOSIS — Z3A20 20 weeks gestation of pregnancy: Secondary | ICD-10-CM | POA: Insufficient documentation

## 2016-05-03 DIAGNOSIS — D649 Anemia, unspecified: Secondary | ICD-10-CM | POA: Diagnosis not present

## 2016-05-03 DIAGNOSIS — O9989 Other specified diseases and conditions complicating pregnancy, childbirth and the puerperium: Secondary | ICD-10-CM

## 2016-05-03 DIAGNOSIS — O99332 Smoking (tobacco) complicating pregnancy, second trimester: Secondary | ICD-10-CM | POA: Insufficient documentation

## 2016-05-03 DIAGNOSIS — B9689 Other specified bacterial agents as the cause of diseases classified elsewhere: Secondary | ICD-10-CM

## 2016-05-03 DIAGNOSIS — R109 Unspecified abdominal pain: Secondary | ICD-10-CM | POA: Diagnosis not present

## 2016-05-03 DIAGNOSIS — N760 Acute vaginitis: Secondary | ICD-10-CM | POA: Diagnosis not present

## 2016-05-03 DIAGNOSIS — R102 Pelvic and perineal pain: Secondary | ICD-10-CM | POA: Diagnosis present

## 2016-05-03 DIAGNOSIS — F17210 Nicotine dependence, cigarettes, uncomplicated: Secondary | ICD-10-CM | POA: Insufficient documentation

## 2016-05-03 DIAGNOSIS — O99012 Anemia complicating pregnancy, second trimester: Secondary | ICD-10-CM | POA: Diagnosis not present

## 2016-05-03 DIAGNOSIS — O23592 Infection of other part of genital tract in pregnancy, second trimester: Secondary | ICD-10-CM | POA: Diagnosis not present

## 2016-05-03 DIAGNOSIS — O26899 Other specified pregnancy related conditions, unspecified trimester: Secondary | ICD-10-CM

## 2016-05-03 LAB — URINALYSIS, ROUTINE W REFLEX MICROSCOPIC
Bilirubin Urine: NEGATIVE
GLUCOSE, UA: NEGATIVE mg/dL
Hgb urine dipstick: NEGATIVE
Ketones, ur: NEGATIVE mg/dL
Nitrite: NEGATIVE
PH: 7 (ref 5.0–8.0)
Protein, ur: NEGATIVE mg/dL
Specific Gravity, Urine: 1.01 (ref 1.005–1.030)

## 2016-05-03 LAB — CBC
HCT: 26.6 % — ABNORMAL LOW (ref 36.0–46.0)
Hemoglobin: 9.4 g/dL — ABNORMAL LOW (ref 12.0–15.0)
MCH: 31.1 pg (ref 26.0–34.0)
MCHC: 35.3 g/dL (ref 30.0–36.0)
MCV: 88.1 fL (ref 78.0–100.0)
PLATELETS: 131 10*3/uL — AB (ref 150–400)
RBC: 3.02 MIL/uL — AB (ref 3.87–5.11)
RDW: 13.7 % (ref 11.5–15.5)
WBC: 7.6 10*3/uL (ref 4.0–10.5)

## 2016-05-03 LAB — WET PREP, GENITAL
Sperm: NONE SEEN
Trich, Wet Prep: NONE SEEN
Yeast Wet Prep HPF POC: NONE SEEN

## 2016-05-03 LAB — URINE MICROSCOPIC-ADD ON: RBC / HPF: NONE SEEN RBC/hpf (ref 0–5)

## 2016-05-03 MED ORDER — FERROUS SULFATE 325 (65 FE) MG PO TABS: 325.0000 mg | ORAL_TABLET | Freq: Every day | ORAL | Status: DC

## 2016-05-03 MED ORDER — CLINDAMYCIN HCL 300 MG PO CAPS: 300.0000 mg | ORAL_CAPSULE | Freq: Two times a day (BID) | ORAL | Status: DC

## 2016-05-03 MED ORDER — IBUPROFEN 600 MG PO TABS
600.0000 mg | ORAL_TABLET | Freq: Once | ORAL | Status: AC
  Administered 2016-05-03: 600 mg via ORAL
  Filled 2016-05-03: qty 1

## 2016-05-03 MED ORDER — ONDANSETRON 8 MG PO TBDP
8.0000 mg | ORAL_TABLET | Freq: Once | ORAL | Status: DC
  Filled 2016-05-03: qty 1

## 2016-05-03 NOTE — Discharge Instructions (Signed)
Pregnancy and Anemia °Anemia is a condition in which the concentration of red blood cells or hemoglobin in the blood is below normal. Hemoglobin is a substance in red blood cells that carries oxygen to the tissues of the body. Anemia results in not enough oxygen reaching these tissues.  °Anemia during pregnancy is common because the fetus uses more iron and folic acid as it is developing. Your body may not produce enough red blood cells because of this. Also, during pregnancy, the liquid part of the blood (plasma) increases by about 50%, and the red blood cells increase by only 25%. This lowers the concentration of the red blood cells and creates a natural anemia-like situation.  °CAUSES  °The most common cause of anemia during pregnancy is not having enough iron in the body to make red blood cells (iron deficiency anemia). Other causes may include: °· Folic acid deficiency. °· Vitamin B12 deficiency. °· Certain prescription or over-the-counter medicines. °· Certain medical conditions or infections that destroy red blood cells. °· A low platelet count and bleeding caused by antibodies that go through the placenta to the fetus from the mother's blood. °SIGNS AND SYMPTOMS  °Mild anemia may not be noticeable. If it becomes severe, symptoms may include: °· Tiredness. °· Shortness of breath, especially with exercise. °· Weakness. °· Fainting. °· Pale looking skin. °· Headaches. °· Feeling a fast or irregular heartbeat (palpitations). °DIAGNOSIS  °The type of anemia is usually diagnosed from your family and medical history and blood tests. °TREATMENT  °Treatment of anemia during pregnancy depends on the cause of the anemia. Treatment can include: °· Supplements of iron, vitamin B12, or folic acid. °· A blood transfusion. This may be needed if blood loss is severe. °· Hospitalization. This may be needed if there is significant continual blood loss. °· Dietary changes. °HOME CARE INSTRUCTIONS  °· Follow your dietitian's or  health care provider's dietary recommendations. °· Increase your vitamin C intake. This will help the stomach absorb more iron. °· Eat a diet rich in iron. This would include foods such as: °¨ Liver. °¨ Beef. °¨ Whole grain bread. °¨ Eggs. °¨ Dried fruit. °· Take iron and vitamins as directed by your health care provider. °· Eat green leafy vegetables. These are a good source of folic acid. °SEEK MEDICAL CARE IF:  °· You have frequent or lasting headaches. °· You are looking pale. °· You are bruising easily. °SEEK IMMEDIATE MEDICAL CARE IF:  °· You have extreme weakness, shortness of breath, or chest pain. °· You become dizzy or have trouble concentrating. °· You have heavy vaginal bleeding. °· You develop a rash. °· You have bloody or black, tarry stools. °· You faint. °· You vomit up blood. °· You vomit repeatedly. °· You have abdominal pain. °· You have a fever or persistent symptoms for more than 2-3 days. °· You have a fever and your symptoms suddenly get worse. °· You are dehydrated. °MAKE SURE YOU:  °· Understand these instructions. °· Will watch your condition. °· Will get help right away if you are not doing well or get worse. °  °This information is not intended to replace advice given to you by your health care provider. Make sure you discuss any questions you have with your health care provider. °  °Document Released: 11/17/2000 Document Revised: 09/10/2013 Document Reviewed: 07/02/2013 °Elsevier Interactive Patient Education ©2016 Else[employer] Inc[employer] ° °

## 2016-05-03 NOTE — MAU Note (Signed)
Pt moving around in bed in pain, Thressa ShellerHeather Hogan CNM called to evaluate pt

## 2016-05-03 NOTE — MAU Note (Signed)
Patient woke up this morning with contractions 3-4 min apart.  Reports feeling some LOF but no vaginal bleeding.  No LOF at this time.

## 2016-05-03 NOTE — MAU Provider Note (Signed)
History     CSN: [id number]  Arrival date and time: 05/03/16 0704   First Provider Initiated Contact with Patient 05/03/16 [phone]      Chief Complaint  Patient presents with  . Pelvic Pain   Pelvic Pain The patient's primary symptoms include pelvic pain. This is a new problem. The current episode started today (at 0500 ). The problem occurs intermittently (about every 3-4 mins initially, but patient states that it seems less frequent now. ). The problem has been gradually improving. Pain severity now: 7/10  The problem affects both sides. She is pregnant. Associated symptoms include abdominal pain. Pertinent negatives include no chills, constipation, diarrhea, dysuria, fever, frequency, nausea, urgency or vomiting. The vaginal discharge was normal. There has been no bleeding. Nothing aggravates the symptoms. She has tried nothing for the symptoms. Menstrual history: LMP 12/20/15     Past Medical History  Diagnosis Date  . Urinary tract infection   . Chlamydia   . Preterm labor   . UTI in pregnancy   . Asthma   . HSV-2 infection complicating pregnancy   . Normal pregnancy, repeat 01/30/2012  . SVD (spontaneous vaginal delivery) 01/30/2012  . Anxiety   . Depression   . Chlamydia     Past Surgical History  Procedure Laterality Date  . No past surgeries      Family History  Problem Relation Age of Onset  . Anesthesia problems Neg Hx   . Hypotension Neg Hx   . Malignant hyperthermia Neg Hx   . Pseudochol deficiency Neg Hx   . Cancer Father     colon  . Seizures Sister   . Diabetes Maternal Grandmother   . Heart disease Maternal Grandfather   . Heart disease Paternal Grandfather     Social History  Substance Use Topics  . Smoking status: Current Every Day Smoker -- 0.50 packs/day for 5 years    Types: Cigarettes  . Smokeless tobacco: Never Used  . Alcohol Use: Yes     Comment: occasional -  NOT WHILE PREG    Allergies: No Known Allergies  Prescriptions prior to  admission  Medication Sig Dispense Refill Last Dose  . Doxylamine-Pyridoxine (DICLEGIS) 10-10 MG TBEC Take 1 tablet with breakfast and lunch.  Take 2 tablets at bedtime. (Patient not taking: Reported on 04/06/2016) 100 tablet 4 Not Taking  . Prenatal Vit-Fe Phos-FA-Omega (VITAFOL GUMMIES) 3.33-0.333-34.8 MG CHEW Chew 3 tablets by mouth daily. 90 tablet 12 Taking  . terconazole (TERAZOL 7) 0.4 % vaginal cream Place 1 applicator vaginally at bedtime. (Patient not taking: Reported on 04/06/2016) 45 g 0 Not Taking    Review of Systems  Constitutional: Negative for fever and chills.  Gastrointestinal: Positive for abdominal pain. Negative for nausea, vomiting, diarrhea and constipation.  Genitourinary: Positive for pelvic pain. Negative for dysuria, urgency and frequency.   Physical Exam   Blood pressure 96/62, pulse 83, temperature 97.8 F (36.6 C), temperature source Oral, resp. rate 16, height  (1.803 m), weight 52.617 kg (116 lb), last menstrual period 12/20/2015, SpO2 100 %.  Physical Exam  Nursing note and vitals reviewed. Constitutional: She is oriented to person, place, and time. She appears well-developed and well-nourished. No distress.  HENT:  Head: Normocephalic.  Cardiovascular: Normal rate.   Respiratory: Effort normal.  GI: Soft. There is no tenderness. There is no rebound.  Genitourinary:   External: no lesion Vagina: small amount of white discharge Cervix: pink, smooth, no CMT, closed/thick/high  Uterus: AGA, FHT 142 with  doppler    Musculoskeletal: Normal range of motion.  Neurological: She is alert and oriented to person, place, and time.  Skin: Skin is warm and dry.  Psychiatric: She has a normal mood and affect.    MAU Course  Procedures Results for orders placed or performed during the hospital encounter of 05/03/16 (from the past 24 hour(s))  Urinalysis, Routine w reflex microscopic ([facility])     Status: Abnormal   Collection Time: 05/03/16  7:00 AM   Result Value Ref Range   Color, Urine YELLOW YELLOW   APPearance CLEAR CLEAR   Specific Gravity, Urine 1.010 1.005 - 1.030   pH 7.0 5.0 - 8.0   Glucose, UA NEGATIVE NEGATIVE mg/dL   Hgb urine dipstick NEGATIVE NEGATIVE   Bilirubin Urine NEGATIVE NEGATIVE   Ketones, ur NEGATIVE NEGATIVE mg/dL   Protein, ur NEGATIVE NEGATIVE mg/dL   Nitrite NEGATIVE NEGATIVE   Leukocytes, UA TRACE (A) NEGATIVE  Urine microscopic-add on     Status: Abnormal   Collection Time: 05/03/16  7:00 AM  Result Value Ref Range   Squamous Epithelial / LPF 0-5 (A) NONE SEEN   WBC, UA 0-5 0 - 5 WBC/hpf   RBC / HPF NONE SEEN 0 - 5 RBC/hpf   Bacteria, UA RARE (A) NONE SEEN  Wet prep, genital     Status: Abnormal   Collection Time: 05/03/16  8:10 AM  Result Value Ref Range   Yeast Wet Prep HPF POC NONE SEEN NONE SEEN   Trich, Wet Prep NONE SEEN NONE SEEN   Clue Cells Wet Prep HPF POC PRESENT (A) NONE SEEN   WBC, Wet Prep HPF POC MODERATE (A) NONE SEEN   Sperm NONE SEEN   CBC     Status: Abnormal   Collection Time: 05/03/16  8:12 AM  Result Value Ref Range   WBC 7.6 4.0 - 10.5 K/uL   RBC 3.02 (L) 3.87 - 5.11 MIL/uL   Hemoglobin 9.4 (L) 12.0 - 15.0 g/dL   HCT 36.626.6 (L) 44.036.0 - 34.746.0 %   MCV 88.1 78.0 - 100.0 fL   MCH 31.1 26.0 - 34.0 pg   MCHC 35.3 30.0 - 36.0 g/dL   RDW 42.513.7 95.611.5 - 38.715.5 %   Platelets 131 (L) 150 - 400 K/uL    MDM 0818 care turned over to E. Crissa Sowder NP Tawnya CrookHogan, Heather Donovan  8:18 AM 05/03/2016   Assessment and Plan  Pain improved with ibuprofen Pt reports hx of recurrent BV -- will treat with clindamycin Pt vomited -- likely d/t taking meds on empty stomach -- will give zofran prior to discharge  A:  1. Anemia affecting pregnancy in second trimester   2. BV (bacterial vaginosis)   3. Abdominal pain affecting pregnancy     P; Discharge home Rx Clindamycin Discussed reasons to return to MAU Keep appt with ob

## 2016-05-04 ENCOUNTER — Encounter: Payer: Medicaid Other | Admitting: Certified Nurse Midwife

## 2016-05-04 ENCOUNTER — Ambulatory Visit (HOSPITAL_COMMUNITY)
Admission: RE | Admit: 2016-05-04 | Discharge: 2016-05-04 | Disposition: A | Discharge status: Home / Self Care | Payer: Medicaid Other | Source: Ambulatory Visit | Attending: Certified Nurse Midwife | Admitting: Certified Nurse Midwife

## 2016-05-04 ENCOUNTER — Other Ambulatory Visit: Payer: Self-pay | Admitting: Certified Nurse Midwife

## 2016-05-04 DIAGNOSIS — Z3A19 19 weeks gestation of pregnancy: Secondary | ICD-10-CM

## 2016-05-04 DIAGNOSIS — O99332 Smoking (tobacco) complicating pregnancy, second trimester: Secondary | ICD-10-CM

## 2016-05-04 DIAGNOSIS — O09212 Supervision of pregnancy with history of pre-term labor, second trimester: Secondary | ICD-10-CM

## 2016-05-04 DIAGNOSIS — Z3689 Encounter for other specified antenatal screening: Secondary | ICD-10-CM

## 2016-05-04 DIAGNOSIS — Z3482 Encounter for supervision of other normal pregnancy, second trimester: Secondary | ICD-10-CM

## 2016-05-04 DIAGNOSIS — O09892 Supervision of other high risk pregnancies, second trimester: Secondary | ICD-10-CM

## 2016-05-04 DIAGNOSIS — O98312 Other infections with a predominantly sexual mode of transmission complicating pregnancy, second trimester: Secondary | ICD-10-CM

## 2016-05-04 DIAGNOSIS — A6009 Herpesviral infection of other urogenital tract: Secondary | ICD-10-CM

## 2016-05-04 LAB — GC/CHLAMYDIA PROBE AMP (~~LOC~~) NOT AT ARMC
Chlamydia: NEGATIVE
Neisseria Gonorrhea: NEGATIVE

## 2016-05-09 ENCOUNTER — Other Ambulatory Visit: Payer: Self-pay | Admitting: Certified Nurse Midwife

## 2016-05-09 ENCOUNTER — Ambulatory Visit (INDEPENDENT_AMBULATORY_CARE_PROVIDER_SITE_OTHER): Payer: Medicaid Other | Admitting: Certified Nurse Midwife

## 2016-05-09 VITALS — BP 93/60 | HR 90 | Temp 98.3°F | Wt 118.0 lb

## 2016-05-09 DIAGNOSIS — Z3492 Encounter for supervision of normal pregnancy, unspecified, second trimester: Secondary | ICD-10-CM

## 2016-05-09 LAB — POCT URINALYSIS DIPSTICK
BILIRUBIN UA: NEGATIVE
Glucose, UA: NEGATIVE
Ketones, UA: NEGATIVE
Leukocytes, UA: NEGATIVE
NITRITE UA: NEGATIVE
PH UA: 6
Protein, UA: NEGATIVE
RBC UA: NEGATIVE
Spec Grav, UA: 1.015
UROBILINOGEN UA: NEGATIVE

## 2016-05-09 NOTE — Progress Notes (Signed)
Subjective:    Jaime Mendoza is a 25 y.o. female being seen today for her obstetrical visit. She is at 1232w3d gestation. Patient reports: no complaints . Fetal movement: normal.  Declines AFP/Quad screen today.  States that she is disappointed that this is a female fetus.    Problem List Items Addressed This Visit    None    Visit Diagnoses    Prenatal care, second trimester    -  Primary    Relevant Orders    POCT urinalysis dipstick (Completed)      Patient Active Problem List   Diagnosis Date Noted  . Supervision of normal intrauterine pregnancy in multigravida in first trimester 03/08/2016  . MDD (major depressive disorder), single episode 11/08/2014  . Homicidal ideation 11/04/2014  . SVD (spontaneous vaginal delivery) 01/30/2012   Objective:    BP 93/60 mmHg  Pulse 90  Temp(Src) 98.3 F (36.8 C)  Wt 118 lb (53.524 kg)  LMP 12/20/2015 (Approximate) FHT: 145 BPM  Uterine Size: size equals dates     Assessment:    Pregnancy @ 6432w3d    Doing well  Plan:    OBGCT: discussed. Signs and symptoms of preterm labor: discussed.  Labs, problem list reviewed and updated 2 hr GTT planned Follow up in 4 weeks.

## 2016-05-19 ENCOUNTER — Other Ambulatory Visit: Payer: Self-pay | Admitting: Certified Nurse Midwife

## 2016-06-07 ENCOUNTER — Ambulatory Visit (INDEPENDENT_AMBULATORY_CARE_PROVIDER_SITE_OTHER): Payer: Medicaid Other | Admitting: Certified Nurse Midwife

## 2016-06-07 VITALS — BP 105/67 | HR 96 | Wt 120.0 lb

## 2016-06-07 DIAGNOSIS — O0992 Supervision of high risk pregnancy, unspecified, second trimester: Secondary | ICD-10-CM

## 2016-06-07 LAB — POCT URINALYSIS DIPSTICK
Bilirubin, UA: NEGATIVE
Blood, UA: NEGATIVE
Glucose, UA: NEGATIVE
LEUKOCYTES UA: NEGATIVE
Nitrite, UA: NEGATIVE
Spec Grav, UA: 1.015
UROBILINOGEN UA: NEGATIVE
pH, UA: 7

## 2016-06-07 MED ORDER — PRENATE PIXIE 10-0.6-0.4-200 MG PO CAPS: 1.0000 | ORAL_CAPSULE | Freq: Every day | ORAL | Status: DC

## 2016-06-07 NOTE — Progress Notes (Signed)
Subjective:    Jaime Mendoza is a 25 y.o. female being seen today for her obstetrical visit. She is at 6137w4d gestation. Patient reports: no bleeding, no contractions, no cramping, no leaking and pelvic pain.  States that FOB has committed murder and is now fearful for her own safety.  Arleen SW notified . Fetal movement: normal.  Problem List Items Addressed This Visit    None    Visit Diagnoses    Supervision of high risk pregnancy, antepartum, second trimester    -  Primary    Relevant Medications    Prenat-FeAsp-Meth-FA-DHA w/o A (PRENATE PIXIE) 10-0.6-0.4-200 MG CAPS    Other Relevant Orders    US MFM OB FOLLOW UP    POCT urinalysis dipstick (Completed)      Patient Active Problem List   Diagnosis Date Noted  . Supervision of normal intrauterine pregnancy in multigravida in first trimester 03/08/2016  . MDD (major depressive disorder), single episode 11/08/2014  . Homicidal ideation 11/04/2014  . SVD (spontaneous vaginal delivery) 01/30/2012   Objective:    BP 105/67 mmHg  Pulse 96  Wt 120 lb (54.432 kg)  LMP 12/20/2015 (Approximate) FHT: 145 BPM  Uterine Size: size equals dates     Assessment:    Pregnancy @ 6537w4d    High situation stressors during pregnancy: meet with SW  Lower pelvic pain of pregnancy  Plan:   RX: abdominal maternity support belt   OBGCT: discussed and ordered for next visit. Signs and symptoms of preterm labor: discussed.  Labs, problem list reviewed and updated 2 hr GTT planned Follow up in 4 weeks.

## 2016-07-04 ENCOUNTER — Encounter (HOSPITAL_COMMUNITY): Payer: Self-pay

## 2016-07-05 ENCOUNTER — Encounter (HOSPITAL_COMMUNITY): Payer: Self-pay

## 2016-07-05 ENCOUNTER — Ambulatory Visit (HOSPITAL_COMMUNITY)
Admission: RE | Admit: 2016-07-05 | Discharge: 2016-07-05 | Disposition: A | Discharge status: Home / Self Care | Payer: Medicaid Other | Source: Ambulatory Visit | Attending: Certified Nurse Midwife | Admitting: Certified Nurse Midwife

## 2016-07-05 ENCOUNTER — Other Ambulatory Visit: Payer: Self-pay | Admitting: Certified Nurse Midwife

## 2016-07-05 ENCOUNTER — Ambulatory Visit (INDEPENDENT_AMBULATORY_CARE_PROVIDER_SITE_OTHER): Payer: Medicaid Other | Admitting: Certified Nurse Midwife

## 2016-07-05 ENCOUNTER — Encounter (HOSPITAL_COMMUNITY): Payer: Self-pay | Admitting: Obstetrics

## 2016-07-05 DIAGNOSIS — O09213 Supervision of pregnancy with history of pre-term labor, third trimester: Secondary | ICD-10-CM | POA: Insufficient documentation

## 2016-07-05 DIAGNOSIS — IMO0002 Reserved for concepts with insufficient information to code with codable children: Secondary | ICD-10-CM

## 2016-07-05 DIAGNOSIS — O98513 Other viral diseases complicating pregnancy, third trimester: Secondary | ICD-10-CM

## 2016-07-05 DIAGNOSIS — Z36 Encounter for antenatal screening of mother: Secondary | ICD-10-CM | POA: Diagnosis not present

## 2016-07-05 DIAGNOSIS — O99333 Smoking (tobacco) complicating pregnancy, third trimester: Secondary | ICD-10-CM | POA: Insufficient documentation

## 2016-07-05 DIAGNOSIS — Z0489 Encounter for examination and observation for other specified reasons: Secondary | ICD-10-CM

## 2016-07-05 DIAGNOSIS — B009 Herpesviral infection, unspecified: Secondary | ICD-10-CM

## 2016-07-05 DIAGNOSIS — O09893 Supervision of other high risk pregnancies, third trimester: Secondary | ICD-10-CM

## 2016-07-05 DIAGNOSIS — Z3A28 28 weeks gestation of pregnancy: Secondary | ICD-10-CM | POA: Diagnosis not present

## 2016-07-05 DIAGNOSIS — O09219 Supervision of pregnancy with history of pre-term labor, unspecified trimester: Secondary | ICD-10-CM

## 2016-07-05 DIAGNOSIS — O09899 Supervision of other high risk pregnancies, unspecified trimester: Secondary | ICD-10-CM | POA: Insufficient documentation

## 2016-07-05 DIAGNOSIS — Z3481 Encounter for supervision of other normal pregnancy, first trimester: Secondary | ICD-10-CM

## 2016-07-05 DIAGNOSIS — O99330 Smoking (tobacco) complicating pregnancy, unspecified trimester: Secondary | ICD-10-CM

## 2016-07-05 DIAGNOSIS — O09892 Supervision of other high risk pregnancies, second trimester: Secondary | ICD-10-CM

## 2016-07-05 DIAGNOSIS — O0992 Supervision of high risk pregnancy, unspecified, second trimester: Secondary | ICD-10-CM

## 2016-07-05 DIAGNOSIS — O09212 Supervision of pregnancy with history of pre-term labor, second trimester: Secondary | ICD-10-CM

## 2016-07-05 NOTE — Patient Instructions (Signed)
Postpartum Tubal Ligation Postpartum tubal ligation (PPTL) is a procedure that closes the fallopian tubes right after childbirth or 1-2 days after childbirth. PPTL is done before the uterus returns to its normal location. The procedure is also called a mini-laparotomy. When the fallopian tubes are closed, the eggs that are released from the ovaries cannot enter the uterus, and sperm cannot reach the egg. PPTL is done so you will not be able to get pregnant or have a baby. Although this procedure may be undone (reversed), it should be considered permanent and irreversible. If you want to have future pregnancies, you should not have this procedure. LET YOUR HEALTH CARE PROVIDER KNOW ABOUT:  Any allergies you have.  All medicines you are taking, including vitamins, herbs, eye drops, creams, and over-the-counter medicines. This includes any use of steroids, either by mouth or in cream form.  Previous problems you or members of your family have had with the use of anesthetics.  Any blood disorders you have.  Previous surgeries you have had.  Any medical conditions you may have. RISKS AND COMPLICATIONS  Infection.  Bleeding.  Injury to surrounding organs.  Side effects from anesthetics.  Failure of the procedure.  Ectopic pregnancy.  Future regret about having the procedure done. BEFORE THE PROCEDURE  You may need to sign certain documents, including an informed consent form, up to 30 days before the date of your tubal ligation.  Follow instructions from your health care provider about eating and drinking restrictions. PROCEDURE  If done 1-2 days after a vaginal delivery:  You will be given one or more of the following:  A medicine that helps you relax (sedative).  A medicine that numbs the area (local anesthetic).  A medicine that makes you fall asleep (general anesthetic).  A medicine that is injected into an area of your body that numbs everything below the injection site  (regional anesthetic).  If you have been given general anesthetic, a tube will be put down your throat to help you breathe.  Your bladder may be emptied with a small tube (catheter).  A small cut (incision) will be made just above the pubic hair line.  The fallopian tubes will be located and brought up through the incision.  The fallopian tubes will be tied off or burned (cauterized), or they will be closed with a clamp, ring, or clip. In many cases, a small portion in the center of each fallopian tube will also be removed.  The incision will be closed with stitches (sutures).  A bandage (dressing) will be placed over the incision. The procedure may vary among health care providers and hospitals. If done after a cesarean delivery:  Tubal ligation will be done through the incision that was used for the cesarean delivery of your baby.  After the tubes are closed, the incision will be closed with stitches (sutures).  A bandage (dressing) will be placed over the incision. The procedure may vary among health care providers and hospitals. AFTER THE PROCEDURE  Your blood pressure, heart rate, breathing rate, and blood oxygen level will be monitored often until the medicines you were given have worn off.  You will be given pain medicine as needed.  If you had general anesthetic, you may have some mild discomfort in your throat. This is from the breathing tube that was placed in your throat while you were sleeping.  You may feel tired, and you should rest for the remainder of the day.  You may have some pain   or cramps in the abdominal area for 3-7 days.   This information is not intended to replace advice given to you by your health care provider. Make sure you discuss any questions you have with your health care provider.   Document Released: 11/20/2005 Document Revised: 04/06/2015 Document Reviewed: 03/02/2012 Elsevier Interactive Patient Education 2016 Else[employer] Inc.  

## 2016-07-10 ENCOUNTER — Other Ambulatory Visit: Payer: Self-pay | Admitting: Certified Nurse Midwife

## 2016-07-19 ENCOUNTER — Other Ambulatory Visit: Payer: Medicaid Other

## 2016-07-19 ENCOUNTER — Encounter: Payer: Medicaid Other | Admitting: Certified Nurse Midwife

## 2016-07-19 ENCOUNTER — Ambulatory Visit (INDEPENDENT_AMBULATORY_CARE_PROVIDER_SITE_OTHER): Payer: Medicaid Other | Admitting: Certified Nurse Midwife

## 2016-07-19 DIAGNOSIS — Z3481 Encounter for supervision of other normal pregnancy, first trimester: Secondary | ICD-10-CM

## 2016-07-19 LAB — POCT URINALYSIS DIPSTICK
BILIRUBIN UA: NEGATIVE
GLUCOSE UA: NEGATIVE
KETONES UA: NEGATIVE
Leukocytes, UA: NEGATIVE
Nitrite, UA: NEGATIVE
PH UA: 6
Protein, UA: NEGATIVE
RBC UA: NEGATIVE
SPEC GRAV UA: 1.01
Urobilinogen, UA: NEGATIVE

## 2016-07-19 NOTE — Addendum Note (Signed)
Addended by: Marya LandryFOSTER, Bryla Burek D on: 07/19/2016 04:37 PM   Modules accepted: Orders

## 2016-07-19 NOTE — Progress Notes (Signed)
Subjective:    Jaime Mendoza is a 25 y.o. female being seen today for her obstetrical visit. She is at 7167w4d gestation. Patient reports no complaints. Fetal movement: normal.  Problem List Items Addressed This Visit    None    Visit Diagnoses   None.    Patient Active Problem List   Diagnosis Date Noted  . History of premature delivery, currently pregnant 07/05/2016  . Supervision of normal intrauterine pregnancy in multigravida in first trimester 03/08/2016  . MDD (major depressive disorder), single episode 11/08/2014  . Homicidal ideation 11/04/2014   Objective:    BP 102/63   Pulse 100   Wt 128 lb (58.1 kg)   LMP 12/20/2015 (Approximate) Comment: period is due  BMI 17.85 kg/m  FHT:  140 BPM  Uterine Size: 30 cm and size equals dates  Presentation: cephalic     Assessment:    Pregnancy @ 8867w4d weeks   BTL planned  H/O PTD with PPROM   Plan:     labs reviewed, problem list updated Consent signed previously for BTL GBS planning TDAP offered  Rhogam given for RH negative Pediatrician: discussed. Infant feeding: undecided. Maternity leave: N/a. Cigarette smoking: quit at start of pregnancy. No orders of the defined types were placed in this encounter.  No orders of the defined types were placed in this encounter.  Follow up in 2 Weeks.

## 2016-07-20 ENCOUNTER — Encounter: Payer: Medicaid Other | Admitting: Certified Nurse Midwife

## 2016-07-20 ENCOUNTER — Other Ambulatory Visit: Payer: Medicaid Other

## 2016-07-20 DIAGNOSIS — Z3493 Encounter for supervision of normal pregnancy, unspecified, third trimester: Secondary | ICD-10-CM

## 2016-07-21 ENCOUNTER — Other Ambulatory Visit: Payer: Self-pay | Admitting: Certified Nurse Midwife

## 2016-07-21 DIAGNOSIS — O99013 Anemia complicating pregnancy, third trimester: Secondary | ICD-10-CM

## 2016-07-21 DIAGNOSIS — Z3481 Encounter for supervision of other normal pregnancy, first trimester: Secondary | ICD-10-CM

## 2016-07-21 LAB — CBC
Hematocrit: 26.7 % — ABNORMAL LOW (ref 34.0–46.6)
Hemoglobin: 9.1 g/dL — ABNORMAL LOW (ref 11.1–15.9)
MCH: 31.8 pg (ref 26.6–33.0)
MCHC: 34.1 g/dL (ref 31.5–35.7)
MCV: 93 fL (ref 79–97)
PLATELETS: 121 10*3/uL — AB (ref 150–379)
RBC: 2.86 x10E6/uL — ABNORMAL LOW (ref 3.77–5.28)
RDW: 13.2 % (ref 12.3–15.4)
WBC: 7.2 10*3/uL (ref 3.4–10.8)

## 2016-07-21 LAB — GLUCOSE TOLERANCE, 2 HOURS W/ 1HR
GLUCOSE, 1 HOUR: 93 mg/dL (ref 65–179)
GLUCOSE, FASTING: 73 mg/dL (ref 65–91)
Glucose, 2 hour: 79 mg/dL (ref 65–152)

## 2016-07-21 LAB — RPR: RPR Ser Ql: NONREACTIVE

## 2016-07-21 LAB — HIV ANTIBODY (ROUTINE TESTING W REFLEX): HIV SCREEN 4TH GENERATION: NONREACTIVE

## 2016-07-21 MED ORDER — NIFEREX PO TABS: 1.0000 | ORAL_TABLET | Freq: Every day | ORAL | 5 refills | Status: DC

## 2016-07-27 ENCOUNTER — Telehealth: Payer: Self-pay | Admitting: *Deleted

## 2016-07-27 NOTE — Telephone Encounter (Signed)
Patient made aware of low Hgb result and Iron tablet sent to pharmacy.

## 2016-08-03 ENCOUNTER — Encounter: Payer: Medicaid Other | Admitting: Obstetrics and Gynecology

## 2016-08-22 ENCOUNTER — Ambulatory Visit (INDEPENDENT_AMBULATORY_CARE_PROVIDER_SITE_OTHER): Payer: Medicaid Other | Admitting: Obstetrics and Gynecology

## 2016-08-22 VITALS — BP 105/64 | HR 91 | Temp 98.6°F | Wt 137.9 lb

## 2016-08-22 DIAGNOSIS — O09893 Supervision of other high risk pregnancies, third trimester: Secondary | ICD-10-CM

## 2016-08-22 DIAGNOSIS — O09213 Supervision of pregnancy with history of pre-term labor, third trimester: Secondary | ICD-10-CM | POA: Diagnosis not present

## 2016-08-22 DIAGNOSIS — Z3481 Encounter for supervision of other normal pregnancy, first trimester: Secondary | ICD-10-CM

## 2016-08-22 MED ORDER — VALACYCLOVIR HCL 1 G PO TABS: 1000.0000 mg | ORAL_TABLET | Freq: Every day | ORAL | 2 refills | Status: DC

## 2016-08-22 NOTE — Progress Notes (Signed)
Patient wants to be checked to see if membranes has ruptured

## 2016-08-22 NOTE — Progress Notes (Signed)
   PRENATAL VISIT NOTE  Subjective:  Jaime D Clelia Mendoza is a 25 y.o. [id number] at 522w3d being seen today for ongoing prenatal care.  She is currently monitored for the following issues for this high-risk pregnancy and has Homicidal ideation; MDD (major depressive disorder), single episode; Supervision of normal intrauterine pregnancy in multigravida in first trimester; and History of premature delivery, currently pregnant on her problem list.  Patient reports a lot of watery discharge and she self treated with monistat for possible yeast infection.  Contractions: Irregular. Vag. Bleeding: None.  Movement: Present. Denies leaking of fluid.   The following portions of the patient's history were reviewed and updated as appropriate: allergies, current medications, past family history, past medical history, past social history, past surgical history and problem list. Problem list updated.  Objective:   Vitals:   08/22/16 1106  BP: 105/64  Pulse: 91  Temp: 98.6 F (37 C)  Weight: 137 lb 14.4 oz (62.6 kg)    Fetal Status: Fetal Heart Rate (bpm): 135 Fundal Height: 34 cm Movement: Present     General:  Alert, oriented and cooperative. Patient is in no acute distress.  Skin: Skin is warm and dry. No rash noted.   Cardiovascular: Normal heart rate noted  Respiratory: Normal respiratory effort, no problems with respiration noted  Abdomen: Soft, gravid, appropriate for gestational age. Pain/Pressure: Present     Pelvic:  Cervical exam deferred        Extremities: Normal range of motion.  Edema: None  Mental Status: Normal mood and affect. Normal behavior. Normal judgment and thought content.   Urinalysis:      Assessment and Plan:  Pregnancy: [id number] at 2822w3d  1. History of premature delivery, currently pregnant, third trimester   2. Supervision of normal intrauterine pregnancy in multigravida in first trimester Patient is doing well Wet prep collected. No evidence of PPROM on exam. SSE did  reveal copious white discharge Limited bedside ultrasound showed a normal amount of fluid in all 4 quadrants Advised patient to monitor for si/sy of PPROM and PTL  Cultures next visit Patient declined the flu vaccine because it made her sick the last time she received it Patient declined Tdap because she doesn't like needles and this is an optional test  Preterm labor symptoms and general obstetric precautions including but not limited to vaginal bleeding, contractions, leaking of fluid and fetal movement were reviewed in detail with the patient. Please refer to After Visit Summary for other counseling recommendations.  Return in about 2 weeks (around 09/05/2016).  Catalina AntiguaPeggy Coley Littles, MD

## 2016-08-26 LAB — NUSWAB VG, CANDIDA 6SP
CANDIDA ALBICANS, NAA: POSITIVE — AB
CANDIDA GLABRATA, NAA: NEGATIVE
CANDIDA KRUSEI, NAA: NEGATIVE
CANDIDA LUSITANIAE, NAA: NEGATIVE
Candida parapsilosis, NAA: NEGATIVE
Candida tropicalis, NAA: NEGATIVE
TRICH VAG BY NAA: NEGATIVE

## 2016-08-28 ENCOUNTER — Telehealth: Payer: Self-pay

## 2016-08-28 MED ORDER — FLUCONAZOLE 150 MG PO TABS: 150.0000 mg | ORAL_TABLET | Freq: Once | ORAL | 0 refills | Status: AC

## 2016-08-28 NOTE — Telephone Encounter (Signed)
-----   Message from Catalina AntiguaPeggy Constant, MD sent at 08/28/2016  8:30 AM EDT ----- Please inform patient of yeast infection.  Diflucan e-prescribed  Thanks  Kinder Morgan EnergyPeggy

## 2016-08-28 NOTE — Addendum Note (Signed)
Addended by: Catalina AntiguaONSTANT, Ellwood Steidle on: 08/28/2016 08:30 AM   Modules accepted: Orders

## 2016-09-06 ENCOUNTER — Ambulatory Visit (INDEPENDENT_AMBULATORY_CARE_PROVIDER_SITE_OTHER): Payer: Medicaid Other | Admitting: Obstetrics & Gynecology

## 2016-09-06 ENCOUNTER — Other Ambulatory Visit (HOSPITAL_COMMUNITY)
Admission: RE | Admit: 2016-09-06 | Discharge: 2016-09-06 | Disposition: A | Discharge status: Home / Self Care | Payer: Medicaid Other | Source: Ambulatory Visit | Attending: Obstetrics & Gynecology | Admitting: Obstetrics & Gynecology

## 2016-09-06 VITALS — BP 100/71 | HR 103 | Temp 98.2°F | Wt 134.0 lb

## 2016-09-06 DIAGNOSIS — Z3483 Encounter for supervision of other normal pregnancy, third trimester: Secondary | ICD-10-CM | POA: Diagnosis not present

## 2016-09-06 DIAGNOSIS — Z113 Encounter for screening for infections with a predominantly sexual mode of transmission: Secondary | ICD-10-CM | POA: Diagnosis present

## 2016-09-06 DIAGNOSIS — O09899 Supervision of other high risk pregnancies, unspecified trimester: Secondary | ICD-10-CM

## 2016-09-06 DIAGNOSIS — O09213 Supervision of pregnancy with history of pre-term labor, third trimester: Secondary | ICD-10-CM

## 2016-09-06 DIAGNOSIS — Z3481 Encounter for supervision of other normal pregnancy, first trimester: Secondary | ICD-10-CM

## 2016-09-06 DIAGNOSIS — O09219 Supervision of pregnancy with history of pre-term labor, unspecified trimester: Secondary | ICD-10-CM

## 2016-09-06 LAB — OB RESULTS CONSOLE GBS: GBS: NEGATIVE

## 2016-09-06 NOTE — Progress Notes (Signed)
Patient stated that she has been feeling irregular contractions and yesterday evening she noticed a small pink/brownish discharge when she wiped. Patient states that the discharge continued until this morning but it has since stopped. Patient states that she has not felt any contractions today.

## 2016-09-06 NOTE — Patient Instructions (Signed)
Vaginal Delivery °During delivery, your health care provider will help you give birth to your baby. During a vaginal delivery, you will work to push the baby out of your vagina. However, before you can push your baby out, a few things need to happen. The opening of your uterus (cervix) has to soften, thin out, and open up (dilate) all the way to 10 cm. Also, your baby has to move down from the uterus into your vagina.  °SIGNS OF LABOR  °Your health care provider will first need to make sure you are in labor. Signs of labor include:  °· Passing what is called the mucous plug before labor begins. This is a small amount of blood-stained mucus. °· Having regular, painful uterine contractions.   °· The time between contractions gets shorter.   °· The discomfort and pain gradually get more intense. °· Contraction pains get worse when walking and do not go away when resting.   °· Your cervix becomes thinner (effacement) and dilates. °BEFORE THE DELIVERY °Once you are in labor and admitted into the hospital or care center, your health care provider may do the following:  °· Perform a complete physical exam. °· Review any complications related to pregnancy or labor.  °· Check your blood pressure, pulse, temperature, and heart rate (vital signs).   °· Determine if, and when, the rupture of amniotic membranes occurred. °· Do a vaginal exam (using a sterile glove and lubricant) to determine:   °¨ The position (presentation) of the baby. Is the baby's head presenting first (vertex) in the birth canal (vagina), or are the feet or buttocks first (breech)?   °¨ The level (station) of the baby's head within the birth canal.   °¨ The effacement and dilatation of the cervix.   °· An electronic fetal monitor is usually placed on your abdomen when you first arrive. This is used to monitor your contractions and the baby's heart rate. °¨ When the monitor is on your abdomen (external fetal monitor), it can only pick up the frequency and  length of your contractions. It cannot tell the strength of your contractions. °¨ If it becomes necessary for your health care provider to know exactly how strong your contractions are or to see exactly what the baby's heart rate is doing, an internal monitor may be inserted into your vagina and uterus. Your health care provider will discuss the benefits and risks of using an internal monitor and obtain your permission before inserting the device. °¨ Continuous fetal monitoring may be needed if you have an epidural, are receiving certain medicines (such as oxytocin), or have pregnancy or labor complications. °· An IV access tube may be placed into a vein in your arm to deliver fluids and medicines if necessary. °THREE STAGES OF LABOR AND DELIVERY °Normal labor and delivery is divided into three stages. °First Stage °This stage starts when you begin to contract regularly and your cervix begins to efface and dilate. It ends when your cervix is completely open (fully dilated). The first stage is the longest stage of labor and can last from 3 hours to 15 hours.  °Several methods are available to help with labor pain. You and your health care provider will decide which option is best for you. Options include:  °· Opioid medicines. These are strong pain medicines that you can get through your IV tube or as a shot into your muscle. These medicines lessen pain but do not make it go away completely.  °· Epidural. A medicine is given through a thin tube that   is inserted in your back. The medicine numbs the lower part of your body and prevents any pain in that area. °· Paracervical pain medicine. This is an injection of an anesthetic on each side of your cervix.   °· You may request natural childbirth, which does not involve the use of pain medicines or an epidural during labor and delivery. Instead, you will use other things, such as breathing exercises, to help cope with the pain. °Second Stage °The second stage of labor  begins when your cervix is fully dilated at 10 cm. It continues until you push your baby down through the birth canal and the baby is born. This stage can take only minutes or several hours. °· The location of your baby's head as it moves through the birth canal is reported as a number called a station. If the baby's head has not started its descent, the station is described as being at minus 3 (-3). When your baby's head is at the zero station, it is at the middle of the birth canal and is engaged in the pelvis. The station of your baby helps indicate the progress of the second stage of labor. °· When your baby is born, your health care provider may hold the baby with his or her head lowered to prevent amniotic fluid, mucus, and blood from getting into the baby's lungs. The baby's mouth and nose may be suctioned with a small bulb syringe to remove any additional fluid. °· Your health care provider may then place the baby on your stomach. It is important to keep the baby from getting cold. To do this, the health care provider will dry the baby off, place the baby directly on your skin (with no blankets between you and the baby), and cover the baby with warm, dry blankets.   °· The umbilical cord is cut. °Third Stage °During the third stage of labor, your health care provider will deliver the placenta (afterbirth) and make sure your bleeding is under control. The delivery of the placenta usually takes about 5 minutes but can take up to 30 minutes. After the placenta is delivered, a medicine may be given either by IV or injection to help contract the uterus and control bleeding. If you are planning to breastfeed, you can try to do so now. °After you deliver the placenta, your uterus should contract and get very firm. If your uterus does not remain firm, your health care provider will massage it. This is important because the contraction of the uterus helps cut off bleeding at the site where the placenta was attached  to your uterus. If your uterus does not contract properly and stay firm, you may continue to bleed heavily. If there is a lot of bleeding, medicines may be given to contract the uterus and stop the bleeding.  °  °This information is not intended to replace advice given to you by your health care provider. Make sure you discuss any questions you have with your health care provider. °  °Document Released: 08/29/2008 Document Revised: 12/11/2014 Document Reviewed: 07/17/2012 °Elsevier Interactive Patient Education ©2016 [employer] ° °

## 2016-09-06 NOTE — Progress Notes (Signed)
   PRENATAL VISIT NOTE  Subjective:  Jaime Mendoza is a 25 y.o. [id number] at 547w4d being seen today for ongoing prenatal care.  She is currently monitored for the following issues for this high-risk pregnancy and has Homicidal ideation; MDD (major depressive disorder), single episode; Supervision of normal intrauterine pregnancy in multigravida in first trimester; and History of premature delivery, currently pregnant on her problem list.  Patient reports occasional contractions.  Contractions: Irregular.  .  Movement: Present. Denies leaking of fluid.   The following portions of the patient's history were reviewed and updated as appropriate: allergies, current medications, past family history, past medical history, past social history, past surgical history and problem list. Problem list updated.  Objective:   Vitals:   09/06/16 1044  BP: 100/71  Pulse: (!) 103  Temp: 98.2 F (36.8 C)  Weight: 134 lb (60.8 kg)    Fetal Status:     Movement: Present     General:  Alert, oriented and cooperative. Patient is in no acute distress.  Skin: Skin is warm and dry. No rash noted.   Cardiovascular: Normal heart rate noted  Respiratory: Normal respiratory effort, no problems with respiration noted  Abdomen: Soft, gravid, appropriate for gestational age. Pain/Pressure: Present     Pelvic:  Cervical exam deferred        Extremities: Normal range of motion.  Edema: None  Mental Status: Normal mood and affect. Normal behavior. Normal judgment and thought content.   Urinalysis: Urine Protein: 1+ Urine Glucose: Negative  Assessment and Plan:  Pregnancy: [id number] at 497w4d  1. Supervision of normal intrauterine pregnancy in multigravida in first trimester Done today - Strep Gp B NAA - Urine cytology ancillary only  2. History of premature delivery, currently pregnant Precautions given  Preterm labor symptoms and general obstetric precautions including but not limited to vaginal bleeding,  contractions, leaking of fluid and fetal movement were reviewed in detail with the patient. Please refer to After Visit Summary for other counseling recommendations.  Return in about 1 week (around 09/13/2016).  Adam PhenixJames G Kentrel Clevenger, MD

## 2016-09-07 LAB — URINE CYTOLOGY ANCILLARY ONLY
Chlamydia: NEGATIVE
Neisseria Gonorrhea: NEGATIVE

## 2016-09-08 LAB — STREP GP B NAA: STREP GROUP B AG: NEGATIVE

## 2016-09-10 ENCOUNTER — Inpatient Hospital Stay (HOSPITAL_COMMUNITY)
Admission: AD | Admit: 2016-09-10 | Discharge: 2016-09-10 | Disposition: A | Discharge status: Home / Self Care | Payer: Medicaid Other | Source: Ambulatory Visit | Attending: Obstetrics & Gynecology | Admitting: Obstetrics & Gynecology

## 2016-09-10 ENCOUNTER — Encounter (HOSPITAL_COMMUNITY): Payer: Self-pay | Admitting: *Deleted

## 2016-09-10 DIAGNOSIS — O99333 Smoking (tobacco) complicating pregnancy, third trimester: Secondary | ICD-10-CM | POA: Diagnosis not present

## 2016-09-10 DIAGNOSIS — O26893 Other specified pregnancy related conditions, third trimester: Secondary | ICD-10-CM | POA: Diagnosis not present

## 2016-09-10 DIAGNOSIS — N393 Stress incontinence (female) (male): Secondary | ICD-10-CM | POA: Diagnosis not present

## 2016-09-10 DIAGNOSIS — F17210 Nicotine dependence, cigarettes, uncomplicated: Secondary | ICD-10-CM | POA: Insufficient documentation

## 2016-09-10 DIAGNOSIS — R0602 Shortness of breath: Secondary | ICD-10-CM | POA: Insufficient documentation

## 2016-09-10 DIAGNOSIS — Z3A37 37 weeks gestation of pregnancy: Secondary | ICD-10-CM | POA: Insufficient documentation

## 2016-09-10 DIAGNOSIS — R109 Unspecified abdominal pain: Secondary | ICD-10-CM | POA: Diagnosis not present

## 2016-09-10 LAB — URINALYSIS, ROUTINE W REFLEX MICROSCOPIC
Bilirubin Urine: NEGATIVE
GLUCOSE, UA: NEGATIVE mg/dL
Ketones, ur: NEGATIVE mg/dL
LEUKOCYTES UA: NEGATIVE
NITRITE: NEGATIVE
PH: 6.5 (ref 5.0–8.0)
PROTEIN: NEGATIVE mg/dL
Specific Gravity, Urine: <1.005 — ABNORMAL LOW (ref 1.005–1.030)

## 2016-09-10 LAB — URINE MICROSCOPIC-ADD ON: BACTERIA UA: NONE SEEN

## 2016-09-10 LAB — POCT FERN TEST: POCT Fern Test: NEGATIVE

## 2016-09-10 NOTE — MAU Provider Note (Signed)
History   [id number] @ 37.1 wks in with c/o SOB and abd pain that has been ongoing for weeks. Pain is pressure type pain. States has had multiple episodes of loss of urine during the course of the night.  CSN: [id number]  Arrival date & time 09/10/16  1005   None     Chief Complaint  Patient presents with  . Rupture of Membranes  . vaginal pressure    HPI  Past Medical History:  Diagnosis Date  . Anxiety   . Asthma   . Chlamydia   . Depression   . HSV-2 infection complicating pregnancy   . Preterm labor   . SVD (spontaneous vaginal delivery) 01/30/2012    Past Surgical History:  Procedure Laterality Date  . NO PAST SURGERIES      Family History  Problem Relation Age of Onset  . Cancer Father     colon  . Seizures Sister   . Diabetes Maternal Grandmother   . Heart disease Maternal Grandfather   . Heart disease Paternal Grandfather   . Anesthesia problems Neg Hx   . Hypotension Neg Hx   . Malignant hyperthermia Neg Hx   . Pseudochol deficiency Neg Hx     Social History  Substance Use Topics  . Smoking status: Current Every Day Smoker    Packs/day: 0.25    Years: 5.00    Types: Cigarettes  . Smokeless tobacco: Never Used  . Alcohol use Yes     Comment: occasional -  NOT WHILE PREG    OB History    Gravida Para Term Preterm AB Living   7 4 3 1 2 4    SAB TAB Ectopic Multiple Live Births   2 0 0 0 4      Review of Systems  Allergies  Review of patient's allergies indicates no known allergies.  Home Medications    BP 119/77   Pulse 81   Temp 97.8 F (36.6 C)   Resp 18   LMP 12/20/2015 (Approximate) Comment: period is due  SpO2 99%   Physical Exam  MAU Course  Procedures (including critical care time)  Labs Reviewed  URINALYSIS, ROUTINE W REFLEX MICROSCOPIC (NOT AT [facility]) - Abnormal; Notable for the following:       Result Value   Specific Gravity, Urine <1.005 (*)    Hgb urine dipstick TRACE (*)    All other components within normal  limits  URINE MICROSCOPIC-ADD ON - Abnormal; Notable for the following:    Squamous Epithelial / LPF 0-5 (*)    All other components within normal limits  POCT FERN TEST - Normal   No results found.   1. SOB (shortness of breath)   2. Abdominal pain in pregnancy, third trimester   3. Stress incontinence of urine       MDM  SVE ft/th/high. FHR pattern reassuring. Isolated occasional uc. Fern neg, no leaking of fluid noted. Will discharge home. Lengthy discussion on common discomforts of late pregnancy, pt verbalized understanding.

## 2016-09-10 NOTE — MAU Note (Signed)
Pt presents to MAU with complaints of increase in pelvic pressure with leakage of fluid since last night.

## 2016-09-10 NOTE — Progress Notes (Signed)
Chaplain referred by staff to see Ms Jaime Mendoza and her SO after Ms Jaime Mendoza had the sixth fetal demise in six pregnancies. This is the first pregnancy by this SO. Ms Jaime Mendoza is very depressed wondering what she is doing wrong and/or what is wrong with her body that does not allow for a full term pregnancy and birth of a child. When asked what medical staff has said, she indicates she never asked questions the last five fetal demises. She was encouraged to have an extended and frank conversation with medical staff concerning these matters.  Ms Jaime Mendoza requested to be anointed with oils as is her christian custom during times of despair. The Chaplain anointed her with oils and a blessing.  Page chaplain if Ms Jaime Mendoza wishes further spiritual support and care.  Benjie Karvonenharles D. Marcell Chavarin, DMin Chaplain

## 2016-09-10 NOTE — Progress Notes (Addendum)
Provider notified of pt arrival to MAU.  Notified that pt is a G7P4 at 940w1d.  Notified that pt is complaining of being SOB but O2 Sat is 99%.  Pt is also complaining of leaking of fluid and feel pressure.  Provider states they will come see the pt.

## 2016-09-10 NOTE — Discharge Instructions (Signed)

## 2016-09-11 ENCOUNTER — Ambulatory Visit (INDEPENDENT_AMBULATORY_CARE_PROVIDER_SITE_OTHER): Payer: Medicaid Other | Admitting: Obstetrics and Gynecology

## 2016-09-11 VITALS — BP 110/70 | HR 96 | Temp 98.7°F | Wt 134.6 lb

## 2016-09-11 DIAGNOSIS — Z3483 Encounter for supervision of other normal pregnancy, third trimester: Secondary | ICD-10-CM

## 2016-09-11 DIAGNOSIS — Z3481 Encounter for supervision of other normal pregnancy, first trimester: Secondary | ICD-10-CM

## 2016-09-11 NOTE — Progress Notes (Signed)
   PRENATAL VISIT NOTE  Subjective:  Jaime Mendoza is a 25 y.o. [id number] at 2657w2d being seen today for ongoing prenatal care.  She is currently monitored for the following issues for this low-risk pregnancy and has Homicidal ideation; MDD (major depressive disorder), single episode; Supervision of normal intrauterine pregnancy in multigravida in first trimester; and History of premature delivery, currently pregnant on her problem list.  Patient reports no complaints.  Contractions: Not present. Vag. Bleeding: None.  Movement: Present. Denies leaking of fluid.   The following portions of the patient's history were reviewed and updated as appropriate: allergies, current medications, past family history, past medical history, past social history, past surgical history and problem list. Problem list updated.  Objective:   Vitals:   09/11/16 1413  BP: 110/70  Pulse: 96  Temp: 98.7 F (37.1 C)  Weight: 134 lb 9.6 oz (61.1 kg)    Fetal Status: Fetal Heart Rate (bpm): 143 Fundal Height: 36 cm Movement: Present  Presentation: Vertex  General:  Alert, oriented and cooperative. Patient is in no acute distress.  Skin: Skin is warm and dry. No rash noted.   Cardiovascular: Normal heart rate noted  Respiratory: Normal respiratory effort, no problems with respiration noted  Abdomen: Soft, gravid, appropriate for gestational age. Pain/Pressure: Present     Pelvic:  Cervical exam performed Dilation: 3 Effacement (%): 50 Station: -3  Extremities: Normal range of motion.  Edema: None  Mental Status: Normal mood and affect. Normal behavior. Normal judgment and thought content.   Urinalysis: Urine Protein: Trace Urine Glucose: Negative  Assessment and Plan:  Pregnancy: [id number] at 5057w2d  1. Supervision of normal intrauterine pregnancy in multigravida in first trimester Patient is doing well without complaints Reviewed culture results Continue Valtrex for prophylaxis  Term labor symptoms and  general obstetric precautions including but not limited to vaginal bleeding, contractions, leaking of fluid and fetal movement were reviewed in detail with the patient. Please refer to After Visit Summary for other counseling recommendations.  Return in about 1 week (around 09/18/2016).  Catalina AntiguaPeggy Audie Stayer, MD

## 2016-09-11 NOTE — Progress Notes (Signed)
Patient is in the office and reports pressure but denies contractions and bleeding.

## 2016-09-12 ENCOUNTER — Inpatient Hospital Stay (HOSPITAL_COMMUNITY)
Admission: AD | Admit: 2016-09-12 | Discharge: 2016-09-12 | Disposition: A | Discharge status: Home / Self Care | Payer: Medicaid Other | Source: Ambulatory Visit | Attending: Obstetrics and Gynecology | Admitting: Obstetrics and Gynecology

## 2016-09-12 ENCOUNTER — Encounter (HOSPITAL_COMMUNITY): Payer: Self-pay | Admitting: *Deleted

## 2016-09-12 DIAGNOSIS — Z3481 Encounter for supervision of other normal pregnancy, first trimester: Secondary | ICD-10-CM

## 2016-09-12 DIAGNOSIS — O99333 Smoking (tobacco) complicating pregnancy, third trimester: Secondary | ICD-10-CM | POA: Insufficient documentation

## 2016-09-12 DIAGNOSIS — M545 Low back pain: Secondary | ICD-10-CM | POA: Diagnosis not present

## 2016-09-12 DIAGNOSIS — O26893 Other specified pregnancy related conditions, third trimester: Secondary | ICD-10-CM | POA: Insufficient documentation

## 2016-09-12 DIAGNOSIS — O09219 Supervision of pregnancy with history of pre-term labor, unspecified trimester: Secondary | ICD-10-CM

## 2016-09-12 DIAGNOSIS — Z3A37 37 weeks gestation of pregnancy: Secondary | ICD-10-CM | POA: Diagnosis not present

## 2016-09-12 DIAGNOSIS — F17210 Nicotine dependence, cigarettes, uncomplicated: Secondary | ICD-10-CM | POA: Insufficient documentation

## 2016-09-12 DIAGNOSIS — O09899 Supervision of other high risk pregnancies, unspecified trimester: Secondary | ICD-10-CM

## 2016-09-12 NOTE — MAU Provider Note (Signed)
  MAU HISTORY AND PHYSICAL  Chief Complaint:  Pain in bottom   Jaime Mendoza is a 25 y.o.  [id number]  at 2258w3d presenting for Pain in bottom . Patient states she has been having  none contractions, none vaginal bleeding, intact membranes, with active fetal movement.    Patient is reluctant to answer questions. Came in with complaint of low back pain that comes and goes, has improved at time of interview. Denies constipation, no blood with bowel movements in toilet or on toilet paper. Has had hemorrhoids with previous pregnancies but does not think pain is due to this.   Past Medical History:  Diagnosis Date  . Anxiety   . Asthma   . Chlamydia   . Depression   . HSV-2 infection complicating pregnancy   . Preterm labor   . SVD (spontaneous vaginal delivery) 01/30/2012    Past Surgical History:  Procedure Laterality Date  . NO PAST SURGERIES      Family History  Problem Relation Age of Onset  . Cancer Father     colon  . Seizures Sister   . Diabetes Maternal Grandmother   . Heart disease Maternal Grandfather   . Heart disease Paternal Grandfather   . Anesthesia problems Neg Hx   . Hypotension Neg Hx   . Malignant hyperthermia Neg Hx   . Pseudochol deficiency Neg Hx     Social History  Substance Use Topics  . Smoking status: Current Every Day Smoker    Packs/day: 0.25    Years: 5.00    Types: Cigarettes  . Smokeless tobacco: Never Used  . Alcohol use Yes     Comment: occasional -  NOT WHILE PREG    No Known Allergies  Prescriptions Prior to Admission  Medication Sig Dispense Refill Last Dose  . Prenatal Vit-Fe Fumarate-FA (PRENATAL MULTIVITAMIN) TABS tablet Take 1 tablet by mouth daily at 12 noon.   09/10/2016  . valACYclovir (VALTREX) 1000 MG tablet Take 1 tablet (1,000 mg total) by mouth daily. 30 tablet 2 09/11/2016 at Unknown time    Review of Systems - Negative except for what is mentioned in HPI.  Physical Exam  Blood pressure 111/71, pulse 81, temperature  99.4 F (37.4 C), temperature source Oral, resp. rate 18, height 5\' 11"  (1.803 m), weight 60.3 kg (133 lb), last menstrual period 12/20/2015. GENERAL: Well-developed, well-nourished female in no acute distress.  LUNGS: No respiratory distress HEART: Regular rate ABDOMEN: Soft, nontender, nondistended, gravid abdomen EXTREMITIES: Nontender, no edema, 2+ distal pulses. Rectal: patient refused exam FHT:  Baseline 140 good variability accelerations present no decelerations Contractions: none   Labs: No results found for this or any previous visit (from the past 24 hour(s)).  Imaging Studies:  No results found.  Assessment: Jaime Mendoza is  25 y.o. [id number] at 6358w3d presents with Pain in bottom  Not in active labor Fetal heart tracing category 1  Plan:  Low back pain- likely due to progression of pregnancy Not in active labor, reassuring fetal heart tracing, good fetal movement reported Patient discharged home with follow up in clinic next week  Tillman SersAngela C Riccio, DO PGY-1 10/10/201712:01 PM  OB FELLOW DISCHARGE ATTESTATION  I have seen and examined this patient and agree with above documentation in the resident's note.   Ernestina PennaNicholas Schenk, MD 12:13 PM

## 2016-09-12 NOTE — Discharge Instructions (Signed)

## 2016-09-12 NOTE — MAU Note (Signed)
States around 0400, started feeling pain in lower back and bottom "like I have to boo boo." "When I go to the bathroom, nothing comes out." States when the pain starts in lower back, it comes around to lower abdomen. States she was examined in office yesterday and was 3cm. Denies problems with pregnancy and denies bleeding or leakage of fluid.

## 2016-09-16 ENCOUNTER — Encounter (HOSPITAL_COMMUNITY): Payer: Self-pay | Admitting: Certified Nurse Midwife

## 2016-09-16 ENCOUNTER — Inpatient Hospital Stay (HOSPITAL_COMMUNITY): Payer: Medicaid Other

## 2016-09-16 ENCOUNTER — Inpatient Hospital Stay (HOSPITAL_COMMUNITY)
Admission: AD | Admit: 2016-09-16 | Discharge: 2016-09-16 | Disposition: A | Discharge status: Home / Self Care | Payer: Medicaid Other | Source: Ambulatory Visit | Attending: Obstetrics and Gynecology | Admitting: Obstetrics and Gynecology

## 2016-09-16 DIAGNOSIS — O99333 Smoking (tobacco) complicating pregnancy, third trimester: Secondary | ICD-10-CM | POA: Insufficient documentation

## 2016-09-16 DIAGNOSIS — B009 Herpesviral infection, unspecified: Secondary | ICD-10-CM | POA: Insufficient documentation

## 2016-09-16 DIAGNOSIS — O98513 Other viral diseases complicating pregnancy, third trimester: Secondary | ICD-10-CM | POA: Insufficient documentation

## 2016-09-16 DIAGNOSIS — Z3A38 38 weeks gestation of pregnancy: Secondary | ICD-10-CM | POA: Diagnosis not present

## 2016-09-16 DIAGNOSIS — O09213 Supervision of pregnancy with history of pre-term labor, third trimester: Secondary | ICD-10-CM | POA: Diagnosis not present

## 2016-09-16 NOTE — Discharge Instructions (Signed)

## 2016-09-16 NOTE — MAU Note (Signed)
Pt states ctxs started at 2AM after nipple stimulation. Pt denies LOF or vaginal delivery. Fetus is active.

## 2016-09-18 ENCOUNTER — Ambulatory Visit (INDEPENDENT_AMBULATORY_CARE_PROVIDER_SITE_OTHER): Payer: Medicaid Other | Admitting: Obstetrics and Gynecology

## 2016-09-18 VITALS — BP 124/88 | HR 52 | Temp 98.6°F | Wt 136.4 lb

## 2016-09-18 DIAGNOSIS — Z8619 Personal history of other infectious and parasitic diseases: Secondary | ICD-10-CM

## 2016-09-18 DIAGNOSIS — O09899 Supervision of other high risk pregnancies, unspecified trimester: Secondary | ICD-10-CM

## 2016-09-18 DIAGNOSIS — Z3483 Encounter for supervision of other normal pregnancy, third trimester: Secondary | ICD-10-CM

## 2016-09-18 DIAGNOSIS — O09219 Supervision of pregnancy with history of pre-term labor, unspecified trimester: Secondary | ICD-10-CM

## 2016-09-18 DIAGNOSIS — O99343 Other mental disorders complicating pregnancy, third trimester: Secondary | ICD-10-CM

## 2016-09-18 DIAGNOSIS — F329 Major depressive disorder, single episode, unspecified: Secondary | ICD-10-CM

## 2016-09-18 DIAGNOSIS — O09213 Supervision of pregnancy with history of pre-term labor, third trimester: Secondary | ICD-10-CM

## 2016-09-18 DIAGNOSIS — Z3481 Encounter for supervision of other normal pregnancy, first trimester: Secondary | ICD-10-CM

## 2016-09-18 NOTE — Progress Notes (Signed)
Patient is in the office for routine appt and states that she has been having constant back pain, denies contractions. Patient reports fetal movement but states that she thinks that it has decreased since Saturday.

## 2016-09-18 NOTE — Progress Notes (Signed)
Subjective:  Jaime Mendoza is a 25 y.o. [id number] at 3786w2d being seen today for ongoing prenatal care.  She is currently monitored for the following issues for this low-risk pregnancy and has MDD (major depressive disorder), single episode; Supervision of normal intrauterine pregnancy in multigravida in first trimester; History of premature delivery, currently pregnant; and History of PCR DNA positive for HSV2 on her problem list.  Patient reports decreased fetal movement. Infant moves daily but not as strong as previously. .  Contractions: Not present. Vag. Bleeding: None.  Movement: Present. Denies leaking of fluid. She reports not taking her PNV or Valtrex.  The following portions of the patient's history were reviewed and updated as appropriate: allergies, current medications, past family history, past medical history, past social history, past surgical history and problem list. Problem list updated.  Objective:   Vitals:   09/18/16 0953  BP: 124/88  Pulse: (!) 52  Temp: 98.6 F (37 C)  Weight: 136 lb 6.4 oz (61.9 kg)    Fetal Status:     Movement: Present     General:  Alert, oriented and cooperative. Patient is in no acute distress.  Skin: Skin is warm and dry. No rash noted.   Cardiovascular: Normal heart rate noted  Respiratory: Normal respiratory effort, no problems with respiration noted  Abdomen: Soft, gravid, appropriate for gestational age. Pain/Pressure: Present     Pelvic:  Cervical exam deferred        Extremities: Normal range of motion.  Edema: None  Mental Status: Normal mood and affect. Normal behavior. Normal judgment and thought content.   Urinalysis: Urine Protein: Trace Urine Glucose: Negative  Assessment and Plan:  Pregnancy: [id number] at 3886w2d  1. Major depressive disorder with single episode, remission status unspecified No meds at present  2. Supervision of normal intrauterine pregnancy in multigravida in first trimester Declines flu and Tdap  vaccine Importance of taking meds reviewed with pt. Desires BTL, papers signed 3. History of premature delivery, currently pregnant   4. History of PCR DNA positive for HSV2 Not taking suppressive therapy  Term labor symptoms and general obstetric precautions including but not limited to vaginal bleeding, contractions, leaking of fluid and fetal movement were reviewed in detail with the patient. Please refer to After Visit Summary for other counseling recommendations.  Return in about 1 week (around 09/25/2016) for OB visit.   Hermina StaggersMichael L Yareth Kearse, MD

## 2016-09-23 ENCOUNTER — Inpatient Hospital Stay (HOSPITAL_COMMUNITY)
Admission: AD | Admit: 2016-09-23 | Discharge: 2016-09-24 | Disposition: A | Discharge status: Home / Self Care | Payer: Medicaid Other | Source: Ambulatory Visit | Attending: Obstetrics & Gynecology | Admitting: Obstetrics & Gynecology

## 2016-09-23 ENCOUNTER — Encounter (HOSPITAL_COMMUNITY): Payer: Self-pay | Admitting: *Deleted

## 2016-09-23 DIAGNOSIS — F17210 Nicotine dependence, cigarettes, uncomplicated: Secondary | ICD-10-CM | POA: Insufficient documentation

## 2016-09-23 DIAGNOSIS — O479 False labor, unspecified: Secondary | ICD-10-CM

## 2016-09-23 DIAGNOSIS — Z3A39 39 weeks gestation of pregnancy: Secondary | ICD-10-CM

## 2016-09-23 DIAGNOSIS — O471 False labor at or after 37 completed weeks of gestation: Secondary | ICD-10-CM | POA: Insufficient documentation

## 2016-09-23 DIAGNOSIS — O99333 Smoking (tobacco) complicating pregnancy, third trimester: Secondary | ICD-10-CM | POA: Insufficient documentation

## 2016-09-23 DIAGNOSIS — O321XX0 Maternal care for breech presentation, not applicable or unspecified: Secondary | ICD-10-CM

## 2016-09-23 NOTE — MAU Note (Signed)
Patient presents to mau with c/o lower back pain that feels like spasms. Started this am and has gotten worse. Denies LOF, VB at this time. +FM

## 2016-09-24 ENCOUNTER — Encounter (HOSPITAL_COMMUNITY): Payer: Self-pay | Admitting: *Deleted

## 2016-09-24 ENCOUNTER — Inpatient Hospital Stay (HOSPITAL_COMMUNITY): Payer: Medicaid Other | Admitting: Anesthesiology

## 2016-09-24 ENCOUNTER — Encounter (HOSPITAL_COMMUNITY)
Admission: AD | Disposition: A | Discharge status: Home / Self Care | Payer: Self-pay | Source: Home / Self Care | Attending: Obstetrics & Gynecology

## 2016-09-24 ENCOUNTER — Inpatient Hospital Stay (HOSPITAL_COMMUNITY)
Admission: AD | Admit: 2016-09-24 | Discharge: 2016-09-27 | DRG: 765 | Disposition: A | Discharge status: Home / Self Care | Payer: Medicaid Other | Attending: Obstetrics & Gynecology | Admitting: Obstetrics & Gynecology

## 2016-09-24 DIAGNOSIS — Z3A39 39 weeks gestation of pregnancy: Secondary | ICD-10-CM

## 2016-09-24 DIAGNOSIS — O321XX0 Maternal care for breech presentation, not applicable or unspecified: Principal | ICD-10-CM | POA: Diagnosis present

## 2016-09-24 DIAGNOSIS — O9902 Anemia complicating childbirth: Secondary | ICD-10-CM | POA: Diagnosis not present

## 2016-09-24 DIAGNOSIS — O9832 Other infections with a predominantly sexual mode of transmission complicating childbirth: Secondary | ICD-10-CM | POA: Diagnosis not present

## 2016-09-24 DIAGNOSIS — Z833 Family history of diabetes mellitus: Secondary | ICD-10-CM

## 2016-09-24 DIAGNOSIS — D509 Iron deficiency anemia, unspecified: Secondary | ICD-10-CM | POA: Diagnosis present

## 2016-09-24 DIAGNOSIS — A6000 Herpesviral infection of urogenital system, unspecified: Secondary | ICD-10-CM | POA: Diagnosis present

## 2016-09-24 DIAGNOSIS — O99334 Smoking (tobacco) complicating childbirth: Secondary | ICD-10-CM | POA: Diagnosis present

## 2016-09-24 DIAGNOSIS — Z302 Encounter for sterilization: Secondary | ICD-10-CM

## 2016-09-24 DIAGNOSIS — Z8249 Family history of ischemic heart disease and other diseases of the circulatory system: Secondary | ICD-10-CM

## 2016-09-24 DIAGNOSIS — Z800 Family history of malignant neoplasm of digestive organs: Secondary | ICD-10-CM

## 2016-09-24 DIAGNOSIS — F17210 Nicotine dependence, cigarettes, uncomplicated: Secondary | ICD-10-CM | POA: Diagnosis present

## 2016-09-24 DIAGNOSIS — Z98891 History of uterine scar from previous surgery: Secondary | ICD-10-CM

## 2016-09-24 LAB — CBC
HEMATOCRIT: 26 % — AB (ref 36.0–46.0)
HEMOGLOBIN: 8.9 g/dL — AB (ref 12.0–15.0)
MCH: 30.4 pg (ref 26.0–34.0)
MCHC: 34.2 g/dL (ref 30.0–36.0)
MCV: 88.7 fL (ref 78.0–100.0)
Platelets: 147 10*3/uL — ABNORMAL LOW (ref 150–400)
RBC: 2.93 MIL/uL — AB (ref 3.87–5.11)
RDW: 13.3 % (ref 11.5–15.5)
WBC: 7.5 10*3/uL (ref 4.0–10.5)

## 2016-09-24 LAB — PREPARE RBC (CROSSMATCH)

## 2016-09-24 SURGERY — Surgical Case
Anesthesia: Spinal | Wound class: Clean Contaminated

## 2016-09-24 MED ORDER — PHENYLEPHRINE 8 MG IN D5W 100 ML (0.08MG/ML) PREMIX OPTIME: 30.0000 ug/min | INJECTION | INTRAVENOUS | Status: DC

## 2016-09-24 MED ORDER — MAGNESIUM HYDROXIDE 400 MG/5ML PO SUSP: 30.0000 mL | ORAL | Status: DC | PRN

## 2016-09-24 MED ORDER — ONDANSETRON HCL 4 MG/2ML IJ SOLN: 4.0000 mg | Freq: Three times a day (TID) | INTRAMUSCULAR | Status: DC | PRN

## 2016-09-24 MED ORDER — OXYTOCIN 10 UNIT/ML IJ SOLN
INTRAVENOUS | Status: DC | PRN
  Administered 2016-09-24: 40 [IU] via INTRAVENOUS

## 2016-09-24 MED ORDER — LACTATED RINGERS IV SOLN
INTRAVENOUS | Status: DC
  Administered 2016-09-24 (×2): via INTRAVENOUS

## 2016-09-24 MED ORDER — MEPERIDINE HCL 25 MG/ML IJ SOLN: 6.2500 mg | INTRAMUSCULAR | Status: DC | PRN

## 2016-09-24 MED ORDER — LACTATED RINGERS IV SOLN: INTRAVENOUS | Status: DC

## 2016-09-24 MED ORDER — ONDANSETRON HCL 4 MG/2ML IJ SOLN
INTRAMUSCULAR | Status: AC
  Filled 2016-09-24: qty 2

## 2016-09-24 MED ORDER — KETOROLAC TROMETHAMINE 30 MG/ML IJ SOLN
INTRAMUSCULAR | Status: AC
  Administered 2016-09-24: 30 mg via INTRAMUSCULAR
  Filled 2016-09-24: qty 1

## 2016-09-24 MED ORDER — SENNOSIDES-DOCUSATE SODIUM 8.6-50 MG PO TABS
2.0000 | ORAL_TABLET | ORAL | Status: DC
  Administered 2016-09-25 – 2016-09-26 (×3): 2 via ORAL
  Filled 2016-09-24 (×3): qty 2

## 2016-09-24 MED ORDER — FENTANYL CITRATE (PF) 100 MCG/2ML IJ SOLN: INTRAMUSCULAR | Status: DC | PRN

## 2016-09-24 MED ORDER — MORPHINE SULFATE-NACL 0.5-0.9 MG/ML-% IV SOSY
PREFILLED_SYRINGE | INTRAVENOUS | Status: DC | PRN
  Administered 2016-09-24: .2 mg via INTRATHECAL

## 2016-09-24 MED ORDER — SOD CITRATE-CITRIC ACID 500-334 MG/5ML PO SOLN
ORAL | Status: AC
  Administered 2016-09-24: 30 mL via ORAL
  Filled 2016-09-24: qty 15

## 2016-09-24 MED ORDER — COCONUT OIL OIL: 1.0000 "application " | TOPICAL_OIL | Status: DC | PRN

## 2016-09-24 MED ORDER — NALBUPHINE HCL 10 MG/ML IJ SOLN: 5.0000 mg | INTRAMUSCULAR | Status: DC | PRN

## 2016-09-24 MED ORDER — OXYTOCIN 10 UNIT/ML IJ SOLN
INTRAMUSCULAR | Status: AC
  Filled 2016-09-24: qty 4

## 2016-09-24 MED ORDER — NALBUPHINE HCL 10 MG/ML IJ SOLN: 5.0000 mg | Freq: Once | INTRAMUSCULAR | Status: DC | PRN

## 2016-09-24 MED ORDER — BUPIVACAINE HCL (PF) 0.5 % IJ SOLN
INTRAMUSCULAR | Status: AC
  Filled 2016-09-24: qty 30

## 2016-09-24 MED ORDER — FAMOTIDINE IN NACL 20-0.9 MG/50ML-% IV SOLN
INTRAVENOUS | Status: AC
  Administered 2016-09-24: 20 mg via INTRAVENOUS
  Filled 2016-09-24: qty 50

## 2016-09-24 MED ORDER — MEASLES, MUMPS & RUBELLA VAC ~~LOC~~ INJ: 0.5000 mL | INJECTION | Freq: Once | SUBCUTANEOUS | Status: DC

## 2016-09-24 MED ORDER — IBUPROFEN 600 MG PO TABS
600.0000 mg | ORAL_TABLET | Freq: Four times a day (QID) | ORAL | Status: DC
  Administered 2016-09-24 – 2016-09-27 (×9): 600 mg via ORAL
  Filled 2016-09-24 (×11): qty 1

## 2016-09-24 MED ORDER — PHENYLEPHRINE 8 MG IN D5W 100 ML (0.08MG/ML) PREMIX OPTIME
INJECTION | INTRAVENOUS | Status: DC | PRN
  Administered 2016-09-24: 60 ug/min via INTRAVENOUS

## 2016-09-24 MED ORDER — NALOXONE HCL 0.4 MG/ML IJ SOLN: 0.4000 mg | INTRAMUSCULAR | Status: DC | PRN

## 2016-09-24 MED ORDER — FAMOTIDINE IN NACL 20-0.9 MG/50ML-% IV SOLN
20.0000 mg | Freq: Once | INTRAVENOUS | Status: AC
  Administered 2016-09-24: 20 mg via INTRAVENOUS

## 2016-09-24 MED ORDER — SODIUM CHLORIDE 0.9% FLUSH: 3.0000 mL | INTRAVENOUS | Status: DC | PRN

## 2016-09-24 MED ORDER — TERBUTALINE SULFATE 1 MG/ML IJ SOLN
INTRAMUSCULAR | Status: AC
  Filled 2016-09-24: qty 1

## 2016-09-24 MED ORDER — NALOXONE HCL 2 MG/2ML IJ SOSY
1.0000 ug/kg/h | PREFILLED_SYRINGE | INTRAVENOUS | Status: DC | PRN
  Filled 2016-09-24: qty 2

## 2016-09-24 MED ORDER — BUPIVACAINE IN DEXTROSE 0.75-8.25 % IT SOLN
INTRATHECAL | Status: DC | PRN
  Administered 2016-09-24: 1.6 mg via INTRATHECAL

## 2016-09-24 MED ORDER — ACETAMINOPHEN 325 MG PO TABS: 650.0000 mg | ORAL_TABLET | ORAL | Status: DC | PRN

## 2016-09-24 MED ORDER — PRENATAL MULTIVITAMIN CH
1.0000 | ORAL_TABLET | Freq: Every day | ORAL | Status: DC
  Administered 2016-09-25: 1 via ORAL
  Filled 2016-09-24 (×4): qty 1

## 2016-09-24 MED ORDER — DIPHENHYDRAMINE HCL 50 MG/ML IJ SOLN: 12.5000 mg | INTRAMUSCULAR | Status: DC | PRN

## 2016-09-24 MED ORDER — CEFAZOLIN SODIUM-DEXTROSE 2-4 GM/100ML-% IV SOLN
2.0000 g | INTRAVENOUS | Status: AC
  Administered 2016-09-24 (×2): 2 g via INTRAVENOUS
  Filled 2016-09-24: qty 100

## 2016-09-24 MED ORDER — FENTANYL CITRATE (PF) 100 MCG/2ML IJ SOLN
INTRAMUSCULAR | Status: DC | PRN
  Administered 2016-09-24: 10 ug via INTRATHECAL

## 2016-09-24 MED ORDER — TERBUTALINE SULFATE 1 MG/ML IJ SOLN
0.2500 mg | Freq: Once | INTRAMUSCULAR | Status: AC
  Administered 2016-09-24: 0.25 mg via SUBCUTANEOUS

## 2016-09-24 MED ORDER — OXYCODONE-ACETAMINOPHEN 5-325 MG PO TABS
2.0000 | ORAL_TABLET | ORAL | Status: DC | PRN
  Administered 2016-09-25 – 2016-09-26 (×6): 2 via ORAL
  Filled 2016-09-24 (×7): qty 2

## 2016-09-24 MED ORDER — NALBUPHINE HCL 10 MG/ML IJ SOLN
5.0000 mg | INTRAMUSCULAR | Status: DC | PRN
  Administered 2016-09-24: 5 mg via INTRAVENOUS
  Filled 2016-09-24: qty 1

## 2016-09-24 MED ORDER — LACTATED RINGERS IV SOLN
INTRAVENOUS | Status: DC | PRN
  Administered 2016-09-24 (×3): via INTRAVENOUS

## 2016-09-24 MED ORDER — OXYCODONE-ACETAMINOPHEN 5-325 MG PO TABS
1.0000 | ORAL_TABLET | ORAL | Status: DC | PRN
  Administered 2016-09-27 (×2): 1 via ORAL
  Filled 2016-09-24 (×2): qty 1

## 2016-09-24 MED ORDER — ONDANSETRON HCL 4 MG/2ML IJ SOLN
INTRAMUSCULAR | Status: DC | PRN
  Administered 2016-09-24: 4 mg via INTRAVENOUS

## 2016-09-24 MED ORDER — FENTANYL CITRATE (PF) 100 MCG/2ML IJ SOLN: 25.0000 ug | INTRAMUSCULAR | Status: DC | PRN

## 2016-09-24 MED ORDER — DIPHENHYDRAMINE HCL 25 MG PO CAPS
25.0000 mg | ORAL_CAPSULE | Freq: Four times a day (QID) | ORAL | Status: DC | PRN
Start: 2016-09-24 — End: 2016-09-27

## 2016-09-24 MED ORDER — BUPIVACAINE HCL (PF) 0.5 % IJ SOLN
INTRAMUSCULAR | Status: DC | PRN
  Administered 2016-09-24: 30 mL

## 2016-09-24 MED ORDER — ACETAMINOPHEN 500 MG PO TABS
1000.0000 mg | ORAL_TABLET | Freq: Four times a day (QID) | ORAL | Status: AC
  Administered 2016-09-24: 1000 mg via ORAL
  Filled 2016-09-24: qty 2

## 2016-09-24 MED ORDER — FENTANYL CITRATE (PF) 100 MCG/2ML IJ SOLN
INTRAMUSCULAR | Status: AC
  Filled 2016-09-24: qty 2

## 2016-09-24 MED ORDER — PROMETHAZINE HCL 25 MG/ML IJ SOLN: 6.2500 mg | INTRAMUSCULAR | Status: DC | PRN

## 2016-09-24 MED ORDER — OXYTOCIN 40 UNITS IN LACTATED RINGERS INFUSION - SIMPLE MED: 2.5000 [IU]/h | INTRAVENOUS | Status: AC

## 2016-09-24 MED ORDER — TERBUTALINE SULFATE 1 MG/ML IJ SOLN
0.2500 mg | Freq: Once | INTRAMUSCULAR | Status: DC
Start: 2016-09-24 — End: 2016-09-24

## 2016-09-24 MED ORDER — PHENYLEPHRINE 8 MG IN D5W 100 ML (0.08MG/ML) PREMIX OPTIME
INJECTION | INTRAVENOUS | Status: AC
  Filled 2016-09-24: qty 100

## 2016-09-24 MED ORDER — FERROUS SULFATE 325 (65 FE) MG PO TABS
325.0000 mg | ORAL_TABLET | Freq: Two times a day (BID) | ORAL | Status: DC
  Administered 2016-09-24 – 2016-09-27 (×6): 325 mg via ORAL
  Filled 2016-09-24 (×6): qty 1

## 2016-09-24 MED ORDER — MENTHOL 3 MG MT LOZG: 1.0000 | LOZENGE | OROMUCOSAL | Status: DC | PRN

## 2016-09-24 MED ORDER — SIMETHICONE 80 MG PO CHEW
80.0000 mg | CHEWABLE_TABLET | ORAL | Status: DC | PRN
  Administered 2016-09-25: 80 mg via ORAL

## 2016-09-24 MED ORDER — ZOLPIDEM TARTRATE 5 MG PO TABS: 5.0000 mg | ORAL_TABLET | Freq: Every evening | ORAL | Status: DC | PRN

## 2016-09-24 MED ORDER — SCOPOLAMINE 1 MG/3DAYS TD PT72
MEDICATED_PATCH | TRANSDERMAL | Status: DC | PRN
  Administered 2016-09-24: 1 via TRANSDERMAL

## 2016-09-24 MED ORDER — MORPHINE SULFATE-NACL 0.5-0.9 MG/ML-% IV SOSY
PREFILLED_SYRINGE | INTRAVENOUS | Status: AC
  Filled 2016-09-24: qty 1

## 2016-09-24 MED ORDER — TETANUS-DIPHTH-ACELL PERTUSSIS 5-2.5-18.5 LF-MCG/0.5 IM SUSP: 0.5000 mL | Freq: Once | INTRAMUSCULAR | Status: DC

## 2016-09-24 MED ORDER — SIMETHICONE 80 MG PO CHEW
80.0000 mg | CHEWABLE_TABLET | ORAL | Status: DC
  Administered 2016-09-25 – 2016-09-26 (×3): 80 mg via ORAL
  Filled 2016-09-24 (×3): qty 1

## 2016-09-24 MED ORDER — SOD CITRATE-CITRIC ACID 500-334 MG/5ML PO SOLN
30.0000 mL | ORAL | Status: AC
  Administered 2016-09-24: 30 mL via ORAL

## 2016-09-24 MED ORDER — DIPHENHYDRAMINE HCL 25 MG PO CAPS
25.0000 mg | ORAL_CAPSULE | ORAL | Status: DC | PRN
  Administered 2016-09-24 – 2016-09-25 (×2): 25 mg via ORAL
  Filled 2016-09-24 (×2): qty 1

## 2016-09-24 SURGICAL SUPPLY — 36 items
APL SKNCLS STERI-STRIP NONHPOA (GAUZE/BANDAGES/DRESSINGS) ×1
BENZOIN TINCTURE PRP APPL 2/3 (GAUZE/BANDAGES/DRESSINGS) ×3 IMPLANT
CHLORAPREP W/TINT 26ML (MISCELLANEOUS) ×3 IMPLANT
CLAMP CORD UMBIL (MISCELLANEOUS) IMPLANT
CLIP FILSHIE TUBAL LIGA STRL (Clip) ×2 IMPLANT
CLOTH BEACON ORANGE TIMEOUT ST (SAFETY) ×3 IMPLANT
DRSG OPSITE POSTOP 4X10 (GAUZE/BANDAGES/DRESSINGS) ×3 IMPLANT
ELECT REM PT RETURN 9FT ADLT (ELECTROSURGICAL) ×3
ELECTRODE REM PT RTRN 9FT ADLT (ELECTROSURGICAL) ×1 IMPLANT
EXTRACTOR VACUUM M CUP 4 TUBE (SUCTIONS) IMPLANT
EXTRACTOR VACUUM M CUP 4' TUBE (SUCTIONS)
GLOVE BIOGEL PI IND STRL 7.0 (GLOVE) ×3 IMPLANT
GLOVE BIOGEL PI INDICATOR 7.0 (GLOVE) ×6
GLOVE ECLIPSE 7.0 STRL STRAW (GLOVE) ×3 IMPLANT
GOWN STRL REUS W/TWL LRG LVL3 (GOWN DISPOSABLE) ×6 IMPLANT
KIT ABG SYR 3ML LUER SLIP (SYRINGE) IMPLANT
NEEDLE HYPO 22GX1.5 SAFETY (NEEDLE) ×3 IMPLANT
NEEDLE HYPO 25X5/8 SAFETYGLIDE (NEEDLE) ×3 IMPLANT
NS IRRIG 1000ML POUR BTL (IV SOLUTION) ×3 IMPLANT
PACK C SECTION WH (CUSTOM PROCEDURE TRAY) ×3 IMPLANT
PAD ABD 7.5X8 STRL (GAUZE/BANDAGES/DRESSINGS) ×3 IMPLANT
PAD ABD 8X7 1/2 STERILE (GAUZE/BANDAGES/DRESSINGS) ×2 IMPLANT
PAD OB MATERNITY 4.3X12.25 (PERSONAL CARE ITEMS) ×3 IMPLANT
PENCIL SMOKE EVAC W/HOLSTER (ELECTROSURGICAL) ×3 IMPLANT
RTRCTR C-SECT PINK 25CM LRG (MISCELLANEOUS) IMPLANT
SPONGE GAUZE 4X4 12PLY STER LF (GAUZE/BANDAGES/DRESSINGS) ×2 IMPLANT
SUT PDS AB 0 CTX 36 PDP370T (SUTURE) ×3 IMPLANT
SUT PLAIN 2 0 XLH (SUTURE) IMPLANT
SUT VIC AB 0 CTX 36 (SUTURE) ×12
SUT VIC AB 0 CTX36XBRD ANBCTRL (SUTURE) ×3 IMPLANT
SUT VIC AB 2-0 CT1 27 (SUTURE) ×3
SUT VIC AB 2-0 CT1 TAPERPNT 27 (SUTURE) IMPLANT
SUT VIC AB 4-0 KS 27 (SUTURE) ×6 IMPLANT
SYR CONTROL 10ML LL (SYRINGE) ×3 IMPLANT
TOWEL OR 17X24 6PK STRL BLUE (TOWEL DISPOSABLE) ×3 IMPLANT
TRAY FOLEY CATH SILVER 14FR (SET/KITS/TRAYS/PACK) ×3 IMPLANT

## 2016-09-24 NOTE — Progress Notes (Signed)
Dr Genevie AnnSchenk notified of pt's admission and status. Will come see pt and do sve

## 2016-09-24 NOTE — H&P (Signed)
LABOR AND DELIVERY ADMISSION HISTORY AND PHYSICAL NOTE  Deasiah D Clelia CroftShaw is a 25 y.o. female [phone] with IUP at 7033w1d by 6 wk US presenting for labor with breech presentation.   She reports positive fetal movement. She denies leakage of fluid or vaginal bleeding.  Prenatal History/Complications:  Past Medical History: Past Medical History:  Diagnosis Date  . Anxiety   . Asthma   . Chlamydia   . Depression   . HSV-2 infection complicating pregnancy   . Preterm labor   . SVD (spontaneous vaginal delivery) 01/30/2012    Past Surgical History: Past Surgical History:  Procedure Laterality Date  . NO PAST SURGERIES      Obstetrical History: OB History    Gravida Para Term Preterm AB Living   7 4 3 1 2 4    SAB TAB Ectopic Multiple Live Births   2 0 0 0 4      Social History: Social History   Social History  . Marital status: Single    Spouse name: N/A  . Number of children: N/A  . Years of education: N/A   Social History Main Topics  . Smoking status: Current Every Day Smoker    Packs/day: 0.25    Years: 5.00    Types: Cigarettes  . Smokeless tobacco: Never Used  . Alcohol use Yes     Comment: occasional -  NOT WHILE PREG  . Drug use: No     Comment: Positive for THC at admission, denied THC use  . Sexual activity: Yes    Birth control/ protection: None     Comment: depo-provera   Other Topics Concern  . Not on file   Social History Narrative  . No narrative on file    Family History: Family History  Problem Relation Age of Onset  . Cancer Father     colon  . Seizures Sister   . Diabetes Maternal Grandmother   . Heart disease Maternal Grandfather   . Heart disease Paternal Grandfather   . Anesthesia problems Neg Hx   . Hypotension Neg Hx   . Malignant hyperthermia Neg Hx   . Pseudochol deficiency Neg Hx     Allergies: No Known Allergies  Prescriptions Prior to Admission  Medication Sig Dispense Refill Last Dose  . metroNIDAZOLE (FLAGYL) 500  MG tablet Take 500 mg by mouth daily.   09/24/2016  . Prenatal Vit-Fe Fumarate-FA (PRENATAL MULTIVITAMIN) TABS tablet Take 1 tablet by mouth daily at 12 noon.   09/24/2016  . valACYclovir (VALTREX) 1000 MG tablet Take 1 tablet (1,000 mg total) by mouth daily. 30 tablet 2 09/24/2016     Review of Systems   All systems reviewed and negative except as stated in HPI  Last menstrual period 12/20/2015. General appearance: alert, cooperative and appears stated age Lungs: clear to auscultation bilaterally Heart: regular rate and rhythm Abdomen: soft, non-tender; bowel sounds normal Extremities: No calf swelling or tenderness Presentation: breech  By bedside ultrasound Fetal monitoring: category 1 Uterine activity: contractions every 2 minutes     Prenatal labs: ABO, Rh: A/Positive/-- (04/05 1455) Antibody: Negative (04/05 1455) Rubella: !Error! RPR: Non Reactive (08/17 1120)  HBsAg: Negative (04/05 1455)  HIV: Non Reactive (08/17 1120)  GBS: Negative (10/04 1230)    Prenatal Transfer Tool  Maternal Diabetes: No Genetic Screening: Declined Maternal Ultrasounds/Referrals: Normal Fetal Ultrasounds or other Referrals:  None Maternal Substance Abuse:  No Significant Maternal Medications:  None Significant Maternal Lab Results: Lab values include: Group B Strep  negative  No results found for this or any previous visit (from the past 24 hour(s)).  Patient Active Problem List   Diagnosis Date Noted  . History of PCR DNA positive for HSV2 09/18/2016  . History of premature delivery, currently pregnant 07/05/2016  . Supervision of normal intrauterine pregnancy in multigravida in first trimester 03/08/2016  . MDD (major depressive disorder), single episode 11/08/2014    Assessment: Hildur D Perrone is a 25 y.o. [id number] at [redacted]w[redacted]d here for onset of labor and breech presentation plan for PLTCS  #Labor:PLTCS for breech presentation #Pain: SPinal #FWB: Category 1 #ID:  GBS  negative #MOF:  breast #MOC: tubal ligation #Circ:  N/A  Ernestina Penna 09/24/2016, 5:29 AM

## 2016-09-24 NOTE — Transfer of Care (Signed)
Immediate Anesthesia Transfer of Care Note  Patient: Jaime Mendoza  Procedure(s) Performed: Procedure(s): CESAREAN SECTION (N/A)  Patient Location: PACU  Anesthesia Type:Spinal  Level of Consciousness: awake  Airway & Oxygen Therapy: Patient Spontanous Breathing  Post-op Assessment: Report given to RN and Post -op Vital signs reviewed and stable  Post vital signs: Reviewed  Last Vitals:  Vitals:   09/24/16 0523  BP: 136/68  Pulse: 108  Resp: 18  Temp: 36.7 C    Last Pain:  Vitals:   09/24/16 0523  TempSrc: Oral         Complications: No apparent anesthesia complications

## 2016-09-24 NOTE — MAU Note (Signed)
Patient presents to mau with c/o contractions every 3 minutes. Denies lof or vb at this time. +FM

## 2016-09-24 NOTE — Anesthesia Preprocedure Evaluation (Signed)
Anesthesia Evaluation  Patient identified by MRN, date of birth, ID band Patient awake    Reviewed: Allergy & Precautions, NPO status , Patient's Chart, lab work & pertinent test results  Airway Mallampati: II  TM Distance: >3 FB Neck ROM: Full    Dental  (+) Teeth Intact, Dental Advisory Given   Pulmonary asthma , Current Smoker,    Pulmonary exam normal breath sounds clear to auscultation       Cardiovascular Exercise Tolerance: Good negative cardio ROS Normal cardiovascular exam Rhythm:Regular Rate:Normal     Neuro/Psych PSYCHIATRIC DISORDERS Anxiety Depression negative neurological ROS  negative psych ROS   GI/Hepatic negative GI ROS, Neg liver ROS,   Endo/Other  negative endocrine ROS  Renal/GU negative Renal ROS     Musculoskeletal negative musculoskeletal ROS (+)   Abdominal   Peds  Hematology negative hematology ROS (+) Blood dyscrasia, anemia , Plt 145k   Anesthesia Other Findings Day of surgery medications reviewed with the patient.  Reproductive/Obstetrics (+) Pregnancy [id number] with IUP at 9210w1d by 6 wk US presenting for labor with breech presentation                              Anesthesia Physical Anesthesia Plan  ASA: II and emergent  Anesthesia Plan: Spinal   Post-op Pain Management:    Induction:   Airway Management Planned:   Additional Equipment:   Intra-op Plan:   Post-operative Plan:   Informed Consent: I have reviewed the patients History and Physical, chart, labs and discussed the procedure including the risks, benefits and alternatives for the proposed anesthesia with the patient or authorized representative who has indicated his/her understanding and acceptance.   Dental advisory given  Plan Discussed with: CRNA, Anesthesiologist and Surgeon  Anesthesia Plan Comments: (Discussed risks and benefits of and differences between spinal and general.  Discussed risks of spinal including headache, backache, failure, bleeding, infection, and nerve damage. Patient consents to spinal. Questions answered. Coagulation studies and platelet count acceptable.  Breech presentation in active labor.)        Anesthesia Quick Evaluation

## 2016-09-24 NOTE — Op Note (Signed)
Jaime Mendoza PROCEDURE DATE: 09/24/2016  PREOPERATIVE DIAGNOSES: Intrauterine pregnancy at 2328w1d weeks gestation; malpresentation: breech presentation in active labor; undesired fertility  POSTOPERATIVE DIAGNOSES: The same  PROCEDURE: Primary Low Transverse Cesarean Section, Bilateral Tubal Sterilization using Filshie clips  SURGEON:  Dr. Jaynie CollinsUgonna Donnelle Olmeda  ASSISTANT:  Dr. Ernestina PennaNicholas Schenk  ANESTHESIOLOGIST: Dr. Arrie AranStephen Turk  INDICATIONS: Jaime Mendoza is a 25 y.o. [id number] at 8528w1d here for cesarean section and bilateral tubal sterilization secondary to the indications listed under preoperative diagnoses; please see preoperative note for further details.  The risks of surgery were discussed with the patient including but were not limited to: bleeding which may require transfusion or reoperation; infection which may require antibiotics; injury to bowel, bladder, ureters or other surrounding organs; injury to the fetus; need for additional procedures including hysterectomy in the event of a life-threatening hemorrhage; placental abnormalities wth subsequent pregnancies, incisional problems, thromboembolic phenomenon and other postoperative/anesthesia complications.  Patient also desires permanent sterilization.  Other reversible forms of contraception were discussed with patient; she declines all other modalities. Risks of procedure discussed with patient including but not limited to: risk of regret, permanence of method, bleeding, infection, injury to surrounding organs and need for additional procedures.  Failure risk of 1-2% with increased risk of ectopic gestation if pregnancy occurs was also discussed with patient.  The patient concurred with the proposed plan, giving informed written consent for the procedures.    FINDINGS:  Viable female infant in frank breech presentation.  Apgars 8 and 9.  Clear amniotic fluid.  Intact placenta, three vessel cord.  Normal uterus, fallopian tubes and ovaries  bilaterally.  Engorged multiple superficial varicosities noted on lower uterine segment serosa; this added to blood loss.  Fallopian tubes sterilized with Filshie clips bilaterally.  ANESTHESIA: Spinal INTRAVENOUS FLUIDS: 2800 ml ESTIMATED BLOOD LOSS: 1000 ml URINE OUTPUT:  800 ml SPECIMENS: Placenta sent to L&D COMPLICATIONS: None immediate  PROCEDURE IN DETAIL:  The patient preoperatively received intravenous antibiotics and had sequential compression devices applied to her lower extremities.   She was then taken to the operating room where spinal anesthesia was administered and was found to be adequate. She was then placed in a dorsal supine position with a leftward tilt, and prepped and draped in a sterile manner.  A foley catheter was placed into her bladder and attached to constant gravity.  After an adequate timeout was performed, a Pfannenstiel skin incision was made with scalpel and carried through to the underlying layer of fascia. The fascia was incised in the midline, and this incision was extended bilaterally using the Mayo scissors.  Kocher clamps were applied to the superior aspect of the fascial incision and the underlying rectus muscles were dissected off bluntly. A similar process was carried out on the inferior aspect of the fascial incision. The rectus muscles were separated in the midline bluntly and the peritoneum was entered bluntly. Attention was turned to the lower uterine segment where a low transverse hysterotomy was made with a scalpel and extended bilaterally bluntly.  The infant was successfully delivered in breech presentation, the cord was clamped and cut after one minute, and the infant was handed over to awaiting neonatology team. Uterine massage was then administered, and the placenta delivered intact with a three-vessel cord. The uterus was then cleared of clots and debris.  The hysterotomy was closed with 0 Vicryl in a running locked fashion, and an imbricating layer was  also placed with 0 Vicryl.  Figure-of-eight 0 Vicryl serosal stitches  were placed to help with hemostasis.  Attention was then turned to the fallopian tubes, and Filshie clips were placed about 3 cm from the cornua, with care given to incorporate the underlying mesosalpinx on both sides, allowing for bilateral tubal sterilization. The pelvis was cleared of all clot and debris. Hemostasis was confirmed on all surfaces.  The peritoneum was closed with a 0 Vicryl running stitch. The fascia was then closed using 0 Vicryl in a running fashion.  The subcutaneous layer was irrigated, and 30 ml of 0.5% Marcaine was injected subcutaneously around the incision.  The skin was closed with a 4-0 Vicryl subcuticular stitch. The patient tolerated the procedure well. Sponge, lap, instrument and needle counts were correct x 3.  She was taken to the recovery room in stable condition.    Jaynie Collins, MD, FACOG Attending Obstetrician & Gynecologist Faculty Practice, [facility]

## 2016-09-24 NOTE — Progress Notes (Signed)
Dr Genevie AnnSchenk in to discuss d/c plan. To return Monday am at 0800 for version. Understands NPO for Monday version. Written and verbal d/c instructions given and understanding voiced.

## 2016-09-24 NOTE — MAU Provider Note (Signed)
History     CSN: [id number]  Arrival date and time: 09/23/16 2238   None     Chief Complaint  Patient presents with  . Back Pain   HPI  Pt is a 25 y/o G7P4 @ 75 and [redacted] wks gestation who presented for labor check. Patient reports contractions every few minutes, reports regular fetal movement and no vaginal bleeding or loss of fluids.   OB History    Gravida Para Term Preterm AB Living   7 4 3 1 2 4    SAB TAB Ectopic Multiple Live Births   2 0 0 0 4      Past Medical History:  Diagnosis Date  . Anxiety   . Asthma   . Chlamydia   . Depression   . HSV-2 infection complicating pregnancy   . Preterm labor   . SVD (spontaneous vaginal delivery) 01/30/2012    Past Surgical History:  Procedure Laterality Date  . NO PAST SURGERIES      Family History  Problem Relation Age of Onset  . Cancer Father     colon  . Seizures Sister   . Diabetes Maternal Grandmother   . Heart disease Maternal Grandfather   . Heart disease Paternal Grandfather   . Anesthesia problems Neg Hx   . Hypotension Neg Hx   . Malignant hyperthermia Neg Hx   . Pseudochol deficiency Neg Hx     Social History  Substance Use Topics  . Smoking status: Current Every Day Smoker    Packs/day: 0.25    Years: 5.00    Types: Cigarettes  . Smokeless tobacco: Never Used  . Alcohol use Yes     Comment: occasional -  NOT WHILE PREG    Allergies: No Known Allergies  Prescriptions Prior to Admission  Medication Sig Dispense Refill Last Dose  . metroNIDAZOLE (FLAGYL) 500 MG tablet Take 500 mg by mouth daily.   09/15/2016 at Unknown time  . Prenatal Vit-Fe Fumarate-FA (PRENATAL MULTIVITAMIN) TABS tablet Take 1 tablet by mouth daily at 12 noon.   Taking  . valACYclovir (VALTREX) 1000 MG tablet Take 1 tablet (1,000 mg total) by mouth daily. 30 tablet 2 Taking    Review of Systems  Constitutional: Negative for chills and fever.  Cardiovascular: Negative for chest pain and palpitations.  Gastrointestinal:  Positive for abdominal pain. Negative for vomiting.  Genitourinary: Negative for dysuria, frequency and urgency.   Physical Exam   Blood pressure 121/78, pulse 98, temperature 98 F (36.7 C), temperature source Oral, resp. rate 18, height 5\' 11"  (1.803 m), weight 137 lb (62.1 kg), last menstrual period 12/20/2015, SpO2 100 %.  Physical Exam  Constitutional: She is oriented to person, place, and time. She appears well-developed and well-nourished.  GI: Soft. Bowel sounds are normal. She exhibits no distension. There is no tenderness.  Neurological: She is alert and oriented to person, place, and time.  Skin: Skin is warm and dry.  Psychiatric: She has a normal mood and affect. Her behavior is normal.    MAU Course  Procedures  MDM In MAU pt underwent bedside US which revealed breech presentation. Discussion was had with Dr. Macon Large and we will set up patient for possible version on Monday. If version fails, pt will have c-section for breech. Pt is aware if her water breaks or her labor intensifies she should return to MAU ASAP given breech presentation.   Assessment and Plan  1. False Labor: pt may be in early labor cervix unchanged at  this time 2. Breech presentation: Scheduled for version on Monday morning. Counseled to retrun to MAU with intensifying labor or SROM.   Ernestina Pennaicholas Schenk 09/24/2016, 1:03 AM

## 2016-09-24 NOTE — Anesthesia Postprocedure Evaluation (Signed)
Anesthesia Post Note  Patient: Jaime Mendoza  Procedure(s) Performed: Procedure(s) (LRB): CESAREAN SECTION (N/A)  Patient location during evaluation: Mother Baby Anesthesia Type: Spinal Level of consciousness: awake, awake and alert, oriented and patient cooperative Pain management: pain level controlled Vital Signs Assessment: post-procedure vital signs reviewed and stable Respiratory status: spontaneous breathing, nonlabored ventilation and respiratory function stable Cardiovascular status: stable Postop Assessment: no headache, no backache, no signs of nausea or vomiting and patient able to bend at knees Anesthetic complications: no Comments: Patient complaining of itching.  RN aware.  According to RN, Jaime Mendoza, patient has already received Nubain and Benadryl.     Last Vitals:  Vitals:   09/24/16 1005 09/24/16 1119  BP: 122/68 106/69  Pulse: 62 (!) 58  Resp: 18 18  Temp: 36.9 C 36.4 C    Last Pain:  Vitals:   09/24/16 1119  TempSrc:   PainSc: 0-No pain   Pain Goal:                 [name]

## 2016-09-25 ENCOUNTER — Inpatient Hospital Stay (HOSPITAL_COMMUNITY): Payer: Medicaid Other

## 2016-09-25 MED ORDER — DIBUCAINE 1 % RE OINT: 1.0000 "application " | TOPICAL_OINTMENT | RECTAL | Status: DC | PRN

## 2016-09-25 MED ORDER — WITCH HAZEL-GLYCERIN EX PADS: 1.0000 "application " | MEDICATED_PAD | CUTANEOUS | Status: DC | PRN

## 2016-09-25 NOTE — Progress Notes (Signed)
Surgical dressing changed per MD order.  Patient refused IV stick for CBC.  Patient educated on rationale for CBC, and MD called and notified. Sharrell Krawiec D

## 2016-09-25 NOTE — Progress Notes (Signed)
Subjective: Postpartum Day 1: Cesarean Delivery Patient reports incisional pain, tolerating PO, + flatus and no problems voiding.    Objective: Vital signs in last 24 hours: Temp:  [97.4 F (36.3 C)-98.5 F (36.9 C)] 98 F (36.7 C) (10/23 0500) Pulse Rate:  [54-92] 76 (10/23 0500) Resp:  [9-24] 18 (10/23 0500) BP: (106-126)/(56-95) 117/70 (10/23 0500) SpO2:  [98 %-100 %] 100 % (10/23 0500)  Physical Exam:  General: alert, cooperative, appears stated age and no distress Lochia: appropriate Uterine Fundus: firm Incision: no dehiscence, no significant erythema DVT Evaluation: No evidence of DVT seen on physical exam.   Recent Labs  09/24/16 0531  HGB 8.9*  HCT 26.0*    Assessment/Plan: Status post Cesarean section. Doing well postoperatively.  Continue current care.  Jaime Mendoza 09/25/2016, 6:21 AM

## 2016-09-25 NOTE — Lactation Note (Signed)
This note was copied from a baby's chart. Lactation Consultation Note After introduced LC to mom, mom stated she is going to bottle feed formula because baby isn't interested at the breast. Discussed newborn feeding behaviors. Mom stated that she didn't think she had enough to satisfy baby that's why baby didn't want to eat. Discussed supply and demand, befits of BF to mom and baby. Told mom to [facility] if she changed her mind, has questions or concerns.  Patient Name: Jaime Mendoza [id number]'s Date: 09/25/2016 Reason for consult: Initial assessment   Maternal Data    Feeding    LATCH Score/Interventions                      Lactation Tools Discussed/Used     Consult Status Consult Status: Complete    Jaime Mendoza G 09/25/2016, 12:44 AM

## 2016-09-26 ENCOUNTER — Encounter (HOSPITAL_COMMUNITY): Payer: Self-pay

## 2016-09-26 LAB — RPR: RPR: NONREACTIVE

## 2016-09-26 LAB — CBC
HEMATOCRIT: 17.6 % — AB (ref 36.0–46.0)
HEMOGLOBIN: 6.2 g/dL — AB (ref 12.0–15.0)
MCH: 31.3 pg (ref 26.0–34.0)
MCHC: 35.2 g/dL (ref 30.0–36.0)
MCV: 88.9 fL (ref 78.0–100.0)
Platelets: 122 10*3/uL — ABNORMAL LOW (ref 150–400)
RBC: 1.98 MIL/uL — AB (ref 3.87–5.11)
RDW: 13.6 % (ref 11.5–15.5)
WBC: 8.2 10*3/uL (ref 4.0–10.5)

## 2016-09-26 MED ORDER — FERROUS SULFATE 325 (65 FE) MG PO TABS: 325.0000 mg | ORAL_TABLET | Freq: Three times a day (TID) | ORAL | 3 refills | Status: DC

## 2016-09-26 MED ORDER — SENNOSIDES-DOCUSATE SODIUM 8.6-50 MG PO TABS: 2.0000 | ORAL_TABLET | Freq: Two times a day (BID) | ORAL | 2 refills | Status: DC

## 2016-09-26 MED ORDER — COCONUT OIL OIL: 1.0000 "application " | TOPICAL_OIL | 99 refills | Status: DC | PRN

## 2016-09-26 MED ORDER — IBUPROFEN 600 MG PO TABS: 600.0000 mg | ORAL_TABLET | Freq: Four times a day (QID) | ORAL | 2 refills | Status: DC

## 2016-09-26 MED ORDER — OXYCODONE-ACETAMINOPHEN 5-325 MG PO TABS: 2.0000 | ORAL_TABLET | ORAL | 0 refills | Status: DC | PRN

## 2016-09-26 NOTE — Progress Notes (Signed)
Subjective: Postpartum Day #2: PLTCS for breech with BTL. Cesarean Delivery Patient reports tolerating PO, + flatus and no problems voiding.  Desires discharge home, declines blood products.  Patient given risks/benfits of blood transfusion.  Desires to go home on iron supplements. Denies vertigo or HA.   Objective: Vital signs in last 24 hours: Temp:  [98.6 F (37 C)-99.1 F (37.3 C)] 98.6 F (37 C) (10/24 0518) Pulse Rate:  [66-83] 66 (10/24 0518) Resp:  [18-20] 18 (10/24 0518) BP: (101-103)/(60-62) 103/62 (10/24 0518)  Physical Exam:  General: alert, cooperative and no distress Lochia: appropriate Uterine Fundus: firm Incision: no significant drainage, no dehiscence, no significant erythema DVT Evaluation: No evidence of DVT seen on physical exam. No cords or calf tenderness. No significant calf/ankle edema.   Recent Labs  09/24/16 0531 09/26/16 0623  HGB 8.9* 6.2*  HCT 26.0* 17.6*    Assessment/Plan: Status post Cesarean section. Doing well postoperatively.  Discharge home with standard precautions and return to clinic in 1-2 weeks.  Jaime Mendoza, CNM 09/26/2016, 8:53 AM

## 2016-09-26 NOTE — Discharge Summary (Signed)
Obstetric Discharge Summary Reason for Admission: onset of labor and PLTCS for breech at term, chronic anemia Prenatal Procedures: NST and ultrasound Intrapartum Procedures: cesarean: low cervical, transverse and tubal ligation Postpartum Procedures: none Complications-Operative and Postpartum: none Hemoglobin  Date Value Ref Range Status  09/26/2016 6.2 (LL) 12.0 - 15.0 g/dL Final    Comment:    REPEATED TO VERIFY CRITICAL RESULT CALLED TO, READ BACK BY AND VERIFIED WITH: BRANSTRATOR,E @0712  ON [id number] BY FLEMINGS    HCT  Date Value Ref Range Status  09/26/2016 17.6 (L) 36.0 - 46.0 % Final   Hematocrit  Date Value Ref Range Status  07/20/2016 26.7 (L) 34.0 - 46.6 % Final    Physical Exam:  General: alert, cooperative and no distress Lochia: appropriate Uterine Fundus: firm Incision: healing well, no significant drainage, no dehiscence, no significant erythema DVT Evaluation: No evidence of DVT seen on physical exam. No cords or calf tenderness. No significant calf/ankle edema.  Discharge Diagnoses: Term Pregnancy-delivered and chronic IDA  Discharge Information: Date: 09/26/2016 Activity: pelvic rest Diet: routine Medications: PNV, Ibuprofen, Colace, Iron and Percocet Condition: stable Instructions: refer to practice specific booklet Discharge to: home Follow-up Information    Cebastian Neis A Arisbeth Purrington, CNM Follow up in 1 week(s).   Specialty:  Certified Nurse Midwife Why:  1-2 weeks, incision check.  Check CBC in 1 month.  Contact information: [address] [place] [id number] [phone]           Newborn Data: Live born female  Birth Weight: 6 lb 11.4 oz (3045 g) APGAR: 8, 9  Home with mother.  Roe Coombsachelle A Boone Gear, CNM 09/26/2016, 8:57 AM

## 2016-09-26 NOTE — Clinical Social Work Maternal (Signed)
CLINICAL SOCIAL WORK MATERNAL/CHILD NOTE  Patient Details  Name: Jaime Mendoza MRN: [id number] Date of Birth: 08/19/1991  Date:  09/26/2016  Clinical Social Worker Initiating Note:  Tylisha Danis Boyd-Gilyard Date/ Time Initiated:  09/26/16/1025     Child's Name:  Jaime Mendoza   Legal Guardian:  Mother   Need for Interpreter:  None   Date of Referral:  09/25/16     Reason for Referral:  Behavioral Health Issues, including SI , Current Substance Use/Substance Use During Pregnancy    Referral Source:  Central Nursery   Address:  [address] [place] [address]  Phone number:  [phone]   Household Members:  Self, Adult Children   Natural Supports (not living in the home):  Parent, Immediate Family, Spouse/significant other   Professional Supports: Case Manager/Social Worker (MOB will receive a home visit from the [facility].)   Employment: Unemployed   Type of Work:     Education:  High school graduate   Financial Resources:  Medicaid   Other Resources:  Food Stamps    Cultural/Religious Considerations Which May Impact Care:  None Reported  Strengths:  Ability to meet basic needs , Pediatrician chosen , Home prepared for child , Understanding of illness   Risk Factors/Current Problems:  Mental Health Concerns , Substance Use  (Hx of CPS involvement)   Cognitive State:  Alert , Able to Concentrate , Insightful , Linear Thinking    Mood/Affect:  Happy , Interested , Comfortable , Calm    CSW Assessment: CSW met with MOB to complete an assessment for hx of MDD, HI, and THC use during pregnancy.  MOB was pleasant and inviting. With MOB's permission, CSW asked MOB's guest to step out so CSW could meet with MOB in private. MOB's cousin left the room; however, FOB (Keith Hamilton) stayed and appeared to be sleep on the coach.  MOB gave CSW permission to meet with MOB while FOB was present.  CSW inquired about MOB's MH, and MOB acknowledged a hx of MDD  and HI.  MOB reported that MOB was consistent with MOB's medications for the past 2 years until MOB's pregnancy confirmation.  MOB stated that MOB discontinued the use of MOB's medications when MOB pregnancy was confirmed (medication names were unknown).  MOB communicated that MOB plans to make an appointment with [facility] to re-initiate a medication regiment. CSW assessed MOB for SI and HI; MOB denied both, and reported that the medications helps keep MOB from having HI.  MOB denies HI about MOB's children and communicated that the HI are often related to friends and family members that have betrayed MOB or that have hurt MOB's feelings.  CSW assisted MOB with processing ways to communicate to MOB's friends and family's about MOB's feelings being hurt.  MOB appeared interested and was able to articulate other non-harmful options. CSW offered MOB resources for outpatient counseling, and MOB declined.   MOB communicated that MOB often gets irritated when working with a counselor because MOB feels like the counselor ask too many questions.  MOB reported that medications have worked for MOB in the past and MOB plans to start medications again in the near future. CSW educated MOB about PPD.  MOB denied any PPD signs and symptoms with MOB's older four children.  MOB asked appropriate questions and appeared informed.  CSW informed MOB of possible supports and interventions to decrease PPD.  CSW also encouraged MOB to seek medical attention if needed for increased signs and symptoms   for PPD.   MOB was insightful and appeared knowledgeable about her mental health as evidence by her appropriate responses to CSW questions.  MOB communicated that she will reach at to MOB's OBGYN office or [facility] if she feels counseling is needed. CSW inquired about MOB's substance use, and MOB acknowledged the use of marijuana throughout pregnancy. CSW thanked MOB for being honest and informed MOB of the hospital's drug screen policy regarding  substance use.  MOB was informed of the screenings for the infant and appeared to be understanding. MOB acknowledged a hx of CPS involvement and reported that at this time MOB did not have an open case with CPS. CSW offered MOB SA resources and interventions and MOB declined.  CSW explained to MOB that CSW will follow the infant's cord and if the results are positive without an explanation, CSW will make a report to [place] CPS. CSW also educated MOB about SIDS.  MOB appeared knowledgeable as evidence by MOB's responses to CSW questions. CSW thanked MOB for meeting with CSW and encouraged MOB to contact CSW if MOB had any additional questions or concerns.    MOB did not have any further questions, concerns, or needs at this time.  CSW Plan/Description:  Patient/Family Education , No Further Intervention Required/No Barriers to Discharge, Information/Referral to Community Resources    Cashlyn Huguley Boyd-Gilyard, MSW, LCSW Clinical Social Work [phone]    Dhani Imel D BOYD-GILYARD, LCSW 09/26/2016, 10:28 AM  

## 2016-09-27 ENCOUNTER — Ambulatory Visit: Payer: Self-pay

## 2016-09-27 LAB — HEMOGLOBIN AND HEMATOCRIT, BLOOD
HCT: 18.1 % — ABNORMAL LOW (ref 36.0–46.0)
Hemoglobin: 6.4 g/dL — CL (ref 12.0–15.0)

## 2016-09-27 NOTE — Lactation Note (Signed)
This note was copied from a baby's chart. Lactation Consultation Note  Patient Name: Jaime Mendoza Today's Date: 09/27/2016  - mom is anemic - with 1000 ml EBL , low Hgb.  Reason for consult: Follow-up assessment;Other (Comment);Infant weight loss (1% weight loss ) Baby is 5178 hours old and if for D/C today . 1% weight loss. Per mom started out breast feeding and planned  To go to formula and the baby didn't like it so I've been breast feeding and the baby seems to like it better.  Breast are feeling fuller today.  LC reviewed  Breast feeding basics and concerns with low Hgb, anemia. Per mom I'm not having any symptoms  And I'm normally anemic and haven't had problems with MS in the pass. LC recommended feeding at least 8 x's day ,  And when breast are feeling fuller and heavier a good sign. If breast are greater than full heading towards tight to firm  Be aware the breast could be engorgement requiring icing for 15 -20 mins, then massage , hand express, pre - pump  With hand pump, feed the baby, soften the 1st breast well, offer the 2nd breast , if the baby only feeds 1st breast , realize the  Other breast to comfort.  Sore nipple and engorgement prevention and tx.  LC offered mom DEBP rental  Option due to not having WIC for Post pumping due to low Hgb. And mom declined for today .  LC recommended if her milk is not in by 4 -5 days to consider coming back to rent a DEBP . Mom is going home with a hand pump And per mom feels comfortable using it.  Mother informed of post-discharge support and given phone number to the lactation department, including services for phone call  assistance; out-patient appointments; and breastfeeding support group. List of other breastfeeding resources in the community given  in the handout. Encouraged mother to call for problems or concerns related to breastfeeding. Maternal Data Has patient been taught Hand Expression?: Yes (per mom familiar ) Does the  patient have breastfeeding experience prior to this delivery?: Yes  Feeding Feeding Type: Breast Fed Length of feed: 10 min  LATCH Score/Interventions                Intervention(s): Breastfeeding basics reviewed     Lactation Tools Discussed/Used WIC Program: No (per mom )   Consult Status Consult Status: Complete Date: 09/27/16    Jaime Mendoza 09/27/2016, 1:30 PM

## 2016-09-27 NOTE — Progress Notes (Signed)
MOB does not have an open CPS [place] [facility] [facility]ove[place](Pam Miller).  There are no barriers to discharge.  Blaine HamperAngel Boyd-Gilyard, MSW, LCSW Clinical Social Work [phone]

## 2016-09-27 NOTE — Progress Notes (Signed)
Subjective: Postpartum Day #3: PLTCS: Cesarean Delivery Patient reports + BM and no problems voiding.  Doing well wants to go home.  Denies any vertigo/HA.  Objective: Vital signs in last 24 hours: Temp:  [98.4 F (36.9 C)-98.5 F (36.9 C)] 98.5 F (36.9 C) (10/25 0530) Pulse Rate:  [70-76] 70 (10/25 0530) Resp:  [18] 18 (10/25 0530) BP: (110-122)/(67-82) 110/67 (10/25 0530) SpO2:  [97 %] 97 % (10/24 1724)  Physical Exam:  General: alert, cooperative and no distress Lochia: appropriate Uterine Fundus: firm Incision: healing well, no significant drainage, no dehiscence, no significant erythema DVT Evaluation: No evidence of DVT seen on physical exam. No cords or calf tenderness. No significant calf/ankle edema.   Recent Labs  09/26/16 0623 09/27/16 0542  HGB 6.2* 6.4*  HCT 17.6* 18.1*    Assessment/Plan: Status post Cesarean section. Doing well postoperatively.  Discharge home with standard precautions and return to clinic in 1-2 weeks. Anemia: stable on iron.    Jaime Mendoza, CNM 09/27/2016, 9:02 AM

## 2016-09-28 LAB — TYPE AND SCREEN
ABO/RH(D): A POS
ANTIBODY SCREEN: NEGATIVE
UNIT DIVISION: 0
UNIT DIVISION: 0
Unit division: 0
Unit division: 0
Unit division: 0
Unit division: 0

## 2016-10-05 ENCOUNTER — Encounter: Payer: Self-pay | Admitting: Obstetrics & Gynecology

## 2016-10-09 ENCOUNTER — Encounter: Payer: Self-pay | Admitting: Obstetrics & Gynecology

## 2016-10-09 ENCOUNTER — Encounter: Payer: Self-pay | Admitting: *Deleted

## 2016-10-09 NOTE — Progress Notes (Deleted)
   GYNECOLOGY VISIT NOTE  History:  25 y.o. [id number] here today for incision check s/p RCS and BTS on 09/24/16.  She reports***   She denies any abnormal vaginal discharge, bleeding, pelvic pain or other concerns.   Past Medical History:  Diagnosis Date  . Anxiety   . Asthma   . Chlamydia   . Depression   . HSV-2 infection complicating pregnancy   . Preterm labor   . SVD (spontaneous vaginal delivery) 01/30/2012    Past Surgical History:  Procedure Laterality Date  . CESAREAN SECTION N/A 09/24/2016   Procedure: CESAREAN SECTION;  Surgeon: Tereso NewcomerUgonna A Emmett Arntz, MD;  Location: [facility];  Service: Obstetrics;  Laterality: N/A;  . NO PAST SURGERIES      The following portions of the patient's history were reviewed and updated as appropriate: allergies, current medications, past family history, past medical history, past social history, past surgical history and problem list.   Health Maintenance:  Normal pap and negative HRHPV on ***.  Normal mammogram on ***.   Review of Systems:  Pertinent items noted in HPI and remainder of comprehensive ROS otherwise negative.   Objective:  Physical Exam LMP 12/20/2015 (Approximate) Comment: period is due CONSTITUTIONAL: Well-developed, well-nourished female in no acute distress.  HENT:  Normocephalic, atraumatic. External right and left ear normal. Oropharynx is clear and moist EYES: Conjunctivae and EOM are normal. Pupils are equal, round, and reactive to light. No scleral icterus.  NECK: Normal range of motion, supple, no masses SKIN: Skin is warm and dry. No rash noted. Not diaphoretic. No erythema. No pallor. NEUROLOGIC: Alert and oriented to person, place, and time. Normal reflexes, muscle tone coordination. No cranial nerve deficit noted. PSYCHIATRIC: Normal mood and affect. Normal behavior. Normal judgment and thought content. CARDIOVASCULAR: Normal heart rate noted RESPIRATORY: Effort and breath sounds normal, no problems with  respiration noted ABDOMEN: Soft, no distention noted.  Incision *** PELVIC: Deferred MUSCULOSKELETAL: Normal range of motion. No edema noted.   Assessment & Plan:  1. S/P cesarean section and BTS  2. Encounter for postoperative wound check ***   Routine preventative health maintenance measures emphasized. Please refer to After Visit Summary for other counseling recommendations.   No Follow-up on file.   Total face-to-face time with patient: *** minutes. Over 50% of encounter was spent on counseling and coordination of care.   Jaynie CollinsUGONNA  Gilman Olazabal, MD, FACOG Attending Obstetrician & Gynecologist, [facility], [facility]

## 2016-12-14 ENCOUNTER — Ambulatory Visit: Payer: Self-pay | Admitting: Obstetrics & Gynecology

## 2017-04-26 ENCOUNTER — Emergency Department (HOSPITAL_COMMUNITY)
Admission: EM | Admit: 2017-04-26 | Discharge: 2017-04-26 | Disposition: A | Discharge status: Home / Self Care | Payer: Self-pay | Attending: Emergency Medicine | Admitting: Emergency Medicine

## 2017-04-26 ENCOUNTER — Encounter (HOSPITAL_COMMUNITY): Payer: Self-pay | Admitting: *Deleted

## 2017-04-26 DIAGNOSIS — R109 Unspecified abdominal pain: Secondary | ICD-10-CM

## 2017-04-26 DIAGNOSIS — F17210 Nicotine dependence, cigarettes, uncomplicated: Secondary | ICD-10-CM | POA: Insufficient documentation

## 2017-04-26 DIAGNOSIS — B9689 Other specified bacterial agents as the cause of diseases classified elsewhere: Secondary | ICD-10-CM | POA: Insufficient documentation

## 2017-04-26 DIAGNOSIS — J45909 Unspecified asthma, uncomplicated: Secondary | ICD-10-CM | POA: Insufficient documentation

## 2017-04-26 DIAGNOSIS — Z202 Contact with and (suspected) exposure to infections with a predominantly sexual mode of transmission: Secondary | ICD-10-CM | POA: Insufficient documentation

## 2017-04-26 DIAGNOSIS — N760 Acute vaginitis: Secondary | ICD-10-CM | POA: Insufficient documentation

## 2017-04-26 LAB — URINALYSIS, ROUTINE W REFLEX MICROSCOPIC
Bilirubin Urine: NEGATIVE
GLUCOSE, UA: NEGATIVE mg/dL
Ketones, ur: NEGATIVE mg/dL
Nitrite: NEGATIVE
Protein, ur: NEGATIVE mg/dL
Specific Gravity, Urine: 1.02 (ref 1.005–1.030)
pH: 7 (ref 5.0–8.0)

## 2017-04-26 LAB — POC URINE PREG, ED: Preg Test, Ur: NEGATIVE

## 2017-04-26 LAB — WET PREP, GENITAL
Sperm: NONE SEEN
Trich, Wet Prep: NONE SEEN
Yeast Wet Prep HPF POC: NONE SEEN

## 2017-04-26 MED ORDER — METRONIDAZOLE 500 MG PO TABS: 500.0000 mg | ORAL_TABLET | Freq: Two times a day (BID) | ORAL | 0 refills | Status: DC

## 2017-04-26 MED ORDER — CEFTRIAXONE SODIUM 250 MG IJ SOLR
250.0000 mg | Freq: Once | INTRAMUSCULAR | Status: AC
  Administered 2017-04-26: 250 mg via INTRAMUSCULAR
  Filled 2017-04-26: qty 250

## 2017-04-26 MED ORDER — AZITHROMYCIN 250 MG PO TABS
1000.0000 mg | ORAL_TABLET | Freq: Once | ORAL | Status: AC
  Administered 2017-04-26: 1000 mg via ORAL
  Filled 2017-04-26: qty 4

## 2017-04-26 MED ORDER — STERILE WATER FOR INJECTION IJ SOLN
INTRAMUSCULAR | Status: AC
  Administered 2017-04-26: 0.9 mL
  Filled 2017-04-26: qty 10

## 2017-04-26 NOTE — ED Notes (Signed)
ED Provider at bedside. 

## 2017-04-26 NOTE — ED Triage Notes (Signed)
To ED for eval of possible STD exposure. Also states she has flank pain and thinks she may be dehydrated. No abd pain. No nausea or vomiting. States she finished last week (Saturday) and started again yesterday.

## 2017-04-26 NOTE — Discharge Instructions (Signed)
Take your antibiotic as prescribed until completed. I recommend following up with the gynecology clinic listed below within the next week if your symptoms have not improved for follow-up evaluation. You are checked in the ED today for gonorrhea, chlamydia, HIV and syphilis. You should receive your results within the next 2-3 days and will receive a call from the hospital for anything results positive. Please return to the Emergency Department if symptoms worsen or new onset of fever, chest pain, difficulty breathing, back pain, abdominal pain, vomiting, numbness, tingling, weakness.

## 2017-04-26 NOTE — ED Provider Notes (Signed)
[facility]-EMERGENCY DEPT Provider Note   By signing my name below, I, Jaime Mendoza, attest that this documentation has been prepared under the direction and in the presence of Jaime HakeNicole Krissy Orebaugh, PA-C. Electronically Signed: Earmon PhoenixJennifer Mendoza, ED Scribe. 04/26/17. 1:05 PM.    History   Chief Complaint Chief Complaint  Patient presents with  . Flank Pain  . Exposure to STD   HPI  Jaime Mendoza is a 26 y.o. female who presents to the Emergency Department complaining of intermittent bilateral flank pain that began about one month ago. She reports her period has been on and off for the past month. She states she does not drink water and only drinks soda and has had kidney infections in the past. She has not taken anything for pain. There are no modifying factors noted. She denies fever, chills, neck/back pain, vomiting, abdominal pain, hematochezia, diarrhea, vaginal discharge, dysuria, hematuria, numbness, weakness. She reports a tubal ligation 7 months ago during a cesarean section. She denies any heavy lifting or new exercises. She also reports wanting to be tested for STDs due to being concerned her partner is sexually active with multiple partners.    Past Medical History:  Diagnosis Date  . Anxiety   . Asthma   . Chlamydia   . Depression   . HSV-2 infection complicating pregnancy   . Preterm labor   . SVD (spontaneous vaginal delivery) 01/30/2012    Patient Active Problem List   Diagnosis Date Noted  . S/P cesarean section and BTS  09/24/2016  . History of PCR DNA positive for HSV2 09/18/2016  . History of premature delivery, currently pregnant 07/05/2016  . Supervision of normal intrauterine pregnancy in multigravida in first trimester 03/08/2016  . MDD (major depressive disorder), single episode 11/08/2014    Past Surgical History:  Procedure Laterality Date  . CESAREAN SECTION N/A 09/24/2016   Procedure: CESAREAN SECTION;  Surgeon: Tereso NewcomerUgonna A Anyanwu, MD;  Location: [facility];  Service: Obstetrics;  Laterality: N/A;  . NO PAST SURGERIES      OB History    Gravida Para Term Preterm AB Living   7 5 4 1 2 5    SAB TAB Ectopic Multiple Live Births   2 0 0 0 5       Home Medications    Prior to Admission medications   Medication Sig Start Date End Date Taking? Authorizing Provider  coconut oil OIL Apply 1 application topically as needed. 09/26/16   Orvilla Cornwallenney, Rachelle A, CNM  ferrous sulfate 325 (65 FE) MG tablet Take 1 tablet (325 mg total) by mouth 3 (three) times daily with meals. 09/26/16   Orvilla Cornwallenney, Rachelle A, CNM  ibuprofen (ADVIL,MOTRIN) 600 MG tablet Take 1 tablet (600 mg total) by mouth every 6 (six) hours. 09/26/16   Orvilla Cornwallenney, Rachelle A, CNM  metroNIDAZOLE (FLAGYL) 500 MG tablet Take 1 tablet (500 mg total) by mouth 2 (two) times daily. 04/26/17   Barrett HenleNadeau, Conn Trombetta Elizabeth, PA-C  oxyCODONE-acetaminophen (PERCOCET/ROXICET) 5-325 MG tablet Take 2 tablets by mouth every 4 (four) hours as needed (pain scale > 7). 09/26/16   Roe Coombsenney, Rachelle A, CNM  Prenatal MV-Min-FA-Omega-3 (PRENATAL GUMMIES/DHA & FA) 0.4-32.5 MG CHEW Chew 1 each by mouth daily.    [provider]  senna-docusate (SENOKOT-S) 8.6-50 MG tablet Take 2 tablets by mouth 2 (two) times daily. 09/26/16   Roe Coombsenney, Rachelle A, CNM    Family History Family History  Problem Relation Age of Onset  . Cancer Father  colon  . Seizures Sister   . Diabetes Maternal Grandmother   . Heart disease Maternal Grandfather   . Heart disease Paternal Grandfather   . Anesthesia problems Neg Hx   . Hypotension Neg Hx   . Malignant hyperthermia Neg Hx   . Pseudochol deficiency Neg Hx     Social History Social History  Substance Use Topics  . Smoking status: Current Every Day Smoker    Packs/day: 0.25    Years: 5.00    Types: Cigarettes  . Smokeless tobacco: Never Used  . Alcohol use Yes     Comment: occasional -  NOT WHILE PREG     Allergies   Patient has no known  allergies.   Review of Systems Review of Systems  Constitutional: Negative for chills and fever.  Gastrointestinal: Negative for blood in stool, diarrhea and vomiting.  Genitourinary: Positive for flank pain. Negative for dysuria and vaginal discharge.  All other systems reviewed and are negative.    Physical Exam Updated Vital Signs BP 115/81 (BP Location: Right Arm)   Pulse 86   Temp 98.8 F (37.1 C) (Oral)   Resp 16   LMP 04/15/2017   SpO2 99%   Physical Exam  Constitutional: She is oriented to person, place, and time. She appears well-developed and well-nourished.  HENT:  Head: Normocephalic and atraumatic.  Mouth/Throat: Oropharynx is clear and moist. No oropharyngeal exudate.  Eyes: Conjunctivae and EOM are normal. Right eye exhibits no discharge. Left eye exhibits no discharge. No scleral icterus.  Neck: Normal range of motion. Neck supple.  Cardiovascular: Normal rate, regular rhythm, normal heart sounds and intact distal pulses.   Pulmonary/Chest: Effort normal and breath sounds normal. No respiratory distress. She has no wheezes. She has no rales. She exhibits no tenderness.  Abdominal: Soft. Bowel sounds are normal. She exhibits no distension and no mass. There is no tenderness. There is no rigidity, no rebound, no guarding and no CVA tenderness.  Genitourinary: Pelvic exam was performed with patient supine.  Genitourinary Comments: Small amount of bloody, purulent discharge in vaginal vault. No CMT or adnexal tenderness. Exam chaperoned by female nurse.  Musculoskeletal: Normal range of motion. She exhibits no edema.  No midline C, T, or L tenderness. Full range of motion of neck and back. Full range of motion of bilateral upper and lower extremities, with 5/5 strength. Sensation intact. 2+ radial and PT pulses. Cap refill <2 seconds. Patient able to stand and ambulate without assistance.    Neurological: She is alert and oriented to person, place, and time.  Skin:  Skin is warm and dry.  Nursing note and vitals reviewed.  Pelvic exam: normal external genitalia, vulva, vagina, cervix, uterus and adnexa, VULVA: normal appearing vulva with no masses, tenderness or lesions, VAGINA: normal appearing vagina with normal color and discharge, no lesions, vaginal discharge - white, bloody and mucoid, CERVIX: normal appearing cervix without discharge or lesions, WET MOUNT done - results: clue cells, white blood cells, DNA probe for chlamydia and GC obtained, UTERUS: uterus is normal size, shape, consistency and nontender, ADNEXA: normal adnexa in size, nontender and no masses, exam chaperoned by female nurse.   ED Treatments / Results  DIAGNOSTIC STUDIES: Oxygen Saturation is 99% on RA, normal by my interpretation.   COORDINATION OF CARE: 12:34 PM- Will check and treat for GC/chlamydia. Full STD panel ordered. Pt verbalizes understanding and agrees to plan.  Medications  cefTRIAXone (ROCEPHIN) injection 250 mg (250 mg Intramuscular Given 04/26/17 1318)  azithromycin (  ZITHROMAX) tablet 1,000 mg (1,000 mg Oral Given 04/26/17 1317)  sterile water (preservative free) injection (0.9 mLs  Given 04/26/17 1318)    Labs (all labs ordered are listed, but only abnormal results are displayed) Labs Reviewed  WET PREP, GENITAL - Abnormal; Notable for the following:       Result Value   Clue Cells Wet Prep HPF POC PRESENT (*)    WBC, Wet Prep HPF POC MANY (*)    All other components within normal limits  URINALYSIS, ROUTINE W REFLEX MICROSCOPIC - Abnormal; Notable for the following:    Hgb urine dipstick SMALL (*)    Leukocytes, UA TRACE (*)    Bacteria, UA RARE (*)    Squamous Epithelial / LPF 0-5 (*)    All other components within normal limits  RPR  HIV ANTIBODY (ROUTINE TESTING)  POC URINE PREG, ED  GC/CHLAMYDIA PROBE AMP ([place]) NOT AT [facility]    EKG  EKG Interpretation None       Radiology No results found.  Procedures Pelvic exam Date/Time:  04/26/2017 1:02 PM Performed by: Barrett Henle Authorized by: Barrett Henle  Consent: Verbal consent obtained. Consent given by: patient Patient understanding: patient states understanding of the procedure being performed Patient consent: the patient's understanding of the procedure matches consent given Procedure consent: procedure consent matches procedure scheduled Relevant documents: relevant documents present and verified Test results: test results available and properly labeled Required items: required blood products, implants, devices, and special equipment available Patient identity confirmed: verbally with patient Local anesthesia used: no  Anesthesia: Local anesthesia used: no  Sedation: Patient sedated: no Patient tolerance: Patient tolerated the procedure well with no immediate complications    (including critical care time)  Medications Ordered in ED Medications  cefTRIAXone (ROCEPHIN) injection 250 mg (250 mg Intramuscular Given 04/26/17 1318)  azithromycin (ZITHROMAX) tablet 1,000 mg (1,000 mg Oral Given 04/26/17 1317)  sterile water (preservative free) injection (0.9 mLs  Given 04/26/17 1318)     Initial Impression / Assessment and Plan / ED Course  I have reviewed the triage vital signs and the nursing notes.  Pertinent labs & imaging results that were available during my care of the patient were reviewed by me and considered in my medical decision making (see chart for details).     Patient presents with intermittent bilateral flank pain for the past month along with concern for STD exposure.  VSS. Exam unremarkable. Pelvic exam revealed small amount of bloody purulent discharge in vaginal vault, no CMT or adnexal tenderness. Pregnancy negative. Wet prep positive for clue cells and WBCs. UA positive for small hemoglobin, trace leukocytes, rare bacteria; suspect sxs are likely related to menstrual cycle and contamination, do not suspect UTI as  pt is without urinary sxs. Do not suspect kidney stones or pyelonephritis as she has continued to have bilateral flank pain for the past month and endorses vaginal bleeding. No CVA tenderness on exam. Patient to be discharged with instructions to follow up with OBGYN. Discussed importance of using protection when sexually active. Pt understands that they have GC/Chlamydia cultures pending and that they will need to inform all sexual partners if results return positive. Pt has been treated prophylacticly with azithromycin and rocephin due to pts history, pelvic exam, and wet prep. Pt not concerning for PID because hemodynamically stable and no cervical motion tenderness on pelvic exam. Pt has also been treated with flagyl for Bacterial Vaginosis. Pt has been advised to not drink alcohol while  on this medication.  Discussed return precautions.  Final Clinical Impressions(s) / ED Diagnoses   Final diagnoses:  Exposure to STD  BV (bacterial vaginosis)  Flank pain    New Prescriptions Discharge Medication List as of 04/26/2017  1:26 PM    START taking these medications   Details  metroNIDAZOLE (FLAGYL) 500 MG tablet Take 1 tablet (500 mg total) by mouth 2 (two) times daily., Starting Thu 04/26/2017, Print        I personally performed the services described in this documentation, which was scribed in my presence. The recorded information has been reviewed and is accurate.     Barrett Henle, PA-C 04/26/17 1335    Melene Plan, DO 04/26/17 [phone]

## 2017-04-27 LAB — HIV ANTIBODY (ROUTINE TESTING W REFLEX): HIV Screen 4th Generation wRfx: NONREACTIVE

## 2017-04-27 LAB — RPR: RPR Ser Ql: NONREACTIVE

## 2017-05-01 LAB — GC/CHLAMYDIA PROBE AMP (~~LOC~~) NOT AT ARMC
Chlamydia: POSITIVE — AB
Neisseria Gonorrhea: POSITIVE — AB

## 2017-07-04 ENCOUNTER — Emergency Department (HOSPITAL_COMMUNITY)
Admission: EM | Admit: 2017-07-04 | Discharge: 2017-07-04 | Disposition: A | Discharge status: Home / Self Care | Payer: Self-pay | Attending: Emergency Medicine | Admitting: Emergency Medicine

## 2017-07-04 ENCOUNTER — Emergency Department (HOSPITAL_COMMUNITY): Payer: Self-pay

## 2017-07-04 DIAGNOSIS — F17210 Nicotine dependence, cigarettes, uncomplicated: Secondary | ICD-10-CM | POA: Insufficient documentation

## 2017-07-04 DIAGNOSIS — R1013 Epigastric pain: Secondary | ICD-10-CM | POA: Insufficient documentation

## 2017-07-04 DIAGNOSIS — J45909 Unspecified asthma, uncomplicated: Secondary | ICD-10-CM | POA: Insufficient documentation

## 2017-07-04 LAB — URINALYSIS, ROUTINE W REFLEX MICROSCOPIC
BACTERIA UA: NONE SEEN
BILIRUBIN URINE: NEGATIVE
Glucose, UA: NEGATIVE mg/dL
KETONES UR: NEGATIVE mg/dL
Leukocytes, UA: NEGATIVE
Nitrite: NEGATIVE
PROTEIN: NEGATIVE mg/dL
Specific Gravity, Urine: 1.016 (ref 1.005–1.030)
pH: 6 (ref 5.0–8.0)

## 2017-07-04 LAB — COMPREHENSIVE METABOLIC PANEL
ALT: 28 U/L (ref 14–54)
ANION GAP: 6 (ref 5–15)
AST: 37 U/L (ref 15–41)
Albumin: 4 g/dL (ref 3.5–5.0)
Alkaline Phosphatase: 47 U/L (ref 38–126)
BUN: 10 mg/dL (ref 6–20)
CHLORIDE: 109 mmol/L (ref 101–111)
CO2: 24 mmol/L (ref 22–32)
Calcium: 8.9 mg/dL (ref 8.9–10.3)
Creatinine, Ser: 0.81 mg/dL (ref 0.44–1.00)
GFR calc non Af Amer: >60 mL/min (ref >60)
Glucose, Bld: 97 mg/dL (ref 65–99)
Potassium: 3.5 mmol/L (ref 3.5–5.1)
SODIUM: 139 mmol/L (ref 135–145)
Total Bilirubin: 0.4 mg/dL (ref 0.3–1.2)
Total Protein: 6.6 g/dL (ref 6.5–8.1)

## 2017-07-04 LAB — CBC WITH DIFFERENTIAL/PLATELET
Basophils Absolute: 0 10*3/uL (ref 0.0–0.1)
Basophils Relative: 0 %
Eosinophils Absolute: 0.1 10*3/uL (ref 0.0–0.7)
Eosinophils Relative: 1 %
HEMATOCRIT: 32.9 % — AB (ref 36.0–46.0)
Hemoglobin: 10.8 g/dL — ABNORMAL LOW (ref 12.0–15.0)
LYMPHS ABS: 1.9 10*3/uL (ref 0.7–4.0)
Lymphocytes Relative: 28 %
MCH: 26.7 pg (ref 26.0–34.0)
MCHC: 32.8 g/dL (ref 30.0–36.0)
MCV: 81.4 fL (ref 78.0–100.0)
MONOS PCT: 5 %
Monocytes Absolute: 0.3 10*3/uL (ref 0.1–1.0)
NEUTROS ABS: 4.6 10*3/uL (ref 1.7–7.7)
Neutrophils Relative %: 66 %
Platelets: 138 10*3/uL — ABNORMAL LOW (ref 150–400)
RBC: 4.04 MIL/uL (ref 3.87–5.11)
RDW: 17 % — ABNORMAL HIGH (ref 11.5–15.5)
WBC: 6.9 10*3/uL (ref 4.0–10.5)

## 2017-07-04 LAB — PREGNANCY, URINE: Preg Test, Ur: NEGATIVE

## 2017-07-04 LAB — LIPASE, BLOOD: LIPASE: 66 U/L — AB (ref 11–51)

## 2017-07-04 MED ORDER — IOPAMIDOL (ISOVUE-300) INJECTION 61%
100.0000 mL | Freq: Once | INTRAVENOUS | Status: AC | PRN
  Administered 2017-07-04: 80 mL via INTRAVENOUS

## 2017-07-04 MED ORDER — SODIUM CHLORIDE 0.9 % IV BOLUS (SEPSIS)
1000.0000 mL | Freq: Once | INTRAVENOUS | Status: AC
  Administered 2017-07-04: 1000 mL via INTRAVENOUS

## 2017-07-04 MED ORDER — IOPAMIDOL (ISOVUE-300) INJECTION 61%
INTRAVENOUS | Status: AC
  Filled 2017-07-04: qty 100

## 2017-07-04 MED ORDER — MORPHINE SULFATE (PF) 2 MG/ML IV SOLN
1.0000 mg | Freq: Once | INTRAVENOUS | Status: AC
  Administered 2017-07-04: 1 mg via INTRAVENOUS
  Filled 2017-07-04: qty 1

## 2017-07-04 MED ORDER — MORPHINE SULFATE (PF) 2 MG/ML IV SOLN
2.0000 mg | Freq: Once | INTRAVENOUS | Status: AC
  Administered 2017-07-04: 2 mg via INTRAVENOUS
  Filled 2017-07-04: qty 1

## 2017-07-04 MED ORDER — ONDANSETRON HCL 4 MG/2ML IJ SOLN
4.0000 mg | Freq: Once | INTRAMUSCULAR | Status: AC
  Administered 2017-07-04: 4 mg via INTRAVENOUS
  Filled 2017-07-04: qty 2

## 2017-07-04 NOTE — ED Notes (Signed)
Pt is unable to give a urine sample at this time pt is still asleep from morphine

## 2017-07-04 NOTE — ED Triage Notes (Signed)
Per EMS- patient reports that she woke at 0400 today with upper abdominal pain that radiates into the back. Patient also c/o emesis x 2. Upper abdomen tender to touch.

## 2017-07-04 NOTE — ED Provider Notes (Signed)
[facility]-EMERGENCY DEPT Provider Note   CSN: [id number] Arrival date & time: 07/04/17  0935     History   Chief Complaint No chief complaint on file.   HPI Jaime Mendoza is a 26 y.o. female.  HPI  Patient presents to ED for abdominal pain for the past 6 hours. She states that she woke up at 4 AM this morning due to the pain. She states that the pain "feels worse than contractions." She also reports intermittent nausea. Denies any previous history of similar symptoms. States that she is currently on her menstrual cycle. Has not tried any medications PTA. She reports daily tobacco use and occasional alcohol use. She denies any other drug use. Denies urinary symptoms, vaginal discharge, fevers, diarrhea, constipation or blood in stool.   Past Medical History:  Diagnosis Date  . Anxiety   . Asthma   . Chlamydia   . Depression   . HSV-2 infection complicating pregnancy   . Preterm labor   . SVD (spontaneous vaginal delivery) 01/30/2012    Patient Active Problem List   Diagnosis Date Noted  . S/P cesarean section and BTS  09/24/2016  . History of PCR DNA positive for HSV2 09/18/2016  . History of premature delivery, currently pregnant 07/05/2016  . Supervision of normal intrauterine pregnancy in multigravida in first trimester 03/08/2016  . MDD (major depressive disorder), single episode 11/08/2014    Past Surgical History:  Procedure Laterality Date  . CESAREAN SECTION N/A 09/24/2016   Procedure: CESAREAN SECTION;  Surgeon: Tereso NewcomerUgonna A Anyanwu, MD;  Location: [facility];  Service: Obstetrics;  Laterality: N/A;  . NO PAST SURGERIES      OB History    Gravida Para Term Preterm AB Living   7 5 4 1 2 5    SAB TAB Ectopic Multiple Live Births   2 0 0 0 5       Home Medications    Prior to Admission medications   Medication Sig Start Date End Date Taking? Authorizing Provider  coconut oil OIL Apply 1 application topically as needed. Patient not taking: Reported on  07/04/2017 09/26/16   Orvilla Cornwallenney, Rachelle A, CNM  ferrous sulfate 325 (65 FE) MG tablet Take 1 tablet (325 mg total) by mouth 3 (three) times daily with meals. Patient not taking: Reported on 07/04/2017 09/26/16   Orvilla Cornwallenney, Rachelle A, CNM  ibuprofen (ADVIL,MOTRIN) 600 MG tablet Take 1 tablet (600 mg total) by mouth every 6 (six) hours. Patient not taking: Reported on 07/04/2017 09/26/16   Orvilla Cornwallenney, Rachelle A, CNM  metroNIDAZOLE (FLAGYL) 500 MG tablet Take 1 tablet (500 mg total) by mouth 2 (two) times daily. Patient not taking: Reported on 07/04/2017 04/26/17   Barrett HenleNadeau, Nicole Elizabeth, PA-C  oxyCODONE-acetaminophen (PERCOCET/ROXICET) 5-325 MG tablet Take 2 tablets by mouth every 4 (four) hours as needed (pain scale > 7). Patient not taking: Reported on 07/04/2017 09/26/16   Orvilla Cornwallenney, Rachelle A, CNM  senna-docusate (SENOKOT-S) 8.6-50 MG tablet Take 2 tablets by mouth 2 (two) times daily. Patient not taking: Reported on 07/04/2017 09/26/16   Roe Coombsenney, Rachelle A, CNM    Family History Family History  Problem Relation Age of Onset  . Cancer Father        colon  . Seizures Sister   . Diabetes Maternal Grandmother   . Heart disease Maternal Grandfather   . Heart disease Paternal Grandfather   . Anesthesia problems Neg Hx   . Hypotension Neg Hx   . Malignant hyperthermia Neg Hx   .  Pseudochol deficiency Neg Hx     Social History Social History  Substance Use Topics  . Smoking status: Current Every Day Smoker    Packs/day: 0.25    Years: 5.00    Types: Cigarettes  . Smokeless tobacco: Never Used  . Alcohol use Yes     Comment: occasional -  NOT WHILE PREG     Allergies   Patient has no known allergies.   Review of Systems Review of Systems  Constitutional: Negative for appetite change, chills and fever.  HENT: Negative for ear pain, rhinorrhea, sneezing and sore throat.   Eyes: Negative for photophobia and visual disturbance.  Respiratory: Negative for cough, chest tightness, shortness of  breath and wheezing.   Cardiovascular: Negative for chest pain and palpitations.  Gastrointestinal: Positive for abdominal pain and nausea. Negative for blood in stool, constipation, diarrhea and vomiting.  Genitourinary: Positive for flank pain. Negative for dysuria, hematuria and urgency.  Musculoskeletal: Negative for myalgias.  Skin: Negative for rash.  Neurological: Negative for dizziness, weakness and light-headedness.     Physical Exam Updated Vital Signs BP (!) 125/95 (BP Location: Right Arm)   Pulse (!) 56   Temp 98.7 F (37.1 C) (Oral)   Resp 18   Ht 5\' 9"  (1.753 m)   Wt 52.2 kg (115 lb)   SpO2 100%   BMI 16.98 kg/m   Physical Exam  Constitutional: She appears well-developed and well-nourished. No distress.  HENT:  Head: Normocephalic and atraumatic.  Nose: Nose normal.  Eyes: Conjunctivae and EOM are normal. Left eye exhibits no discharge. No scleral icterus.  Neck: Normal range of motion. Neck supple.  Cardiovascular: Normal rate, regular rhythm, normal heart sounds and intact distal pulses.  Exam reveals no gallop and no friction rub.   No murmur heard. Pulmonary/Chest: Effort normal and breath sounds normal. No respiratory distress.  Abdominal: Soft. Bowel sounds are normal. She exhibits no distension. There is tenderness. There is no guarding.    Musculoskeletal: Normal range of motion. She exhibits no edema.  Neurological: She is alert. She exhibits normal muscle tone. Coordination normal.  Skin: Skin is warm and dry. No rash noted.  Psychiatric: She has a normal mood and affect.  Nursing note and vitals reviewed.    ED Treatments / Results  Labs (all labs ordered are listed, but only abnormal results are displayed) Labs Reviewed  LIPASE, BLOOD - Abnormal; Notable for the following:       Result Value   Lipase 66 (*)    All other components within normal limits  CBC WITH DIFFERENTIAL/PLATELET - Abnormal; Notable for the following:    Hemoglobin  10.8 (*)    HCT 32.9 (*)    RDW 17.0 (*)    Platelets 138 (*)    All other components within normal limits  URINALYSIS, ROUTINE W REFLEX MICROSCOPIC - Abnormal; Notable for the following:    Hgb urine dipstick MODERATE (*)    Squamous Epithelial / LPF 0-5 (*)    All other components within normal limits  COMPREHENSIVE METABOLIC PANEL  PREGNANCY, URINE    EKG  EKG Interpretation None       Radiology Ct Abdomen Pelvis W Contrast  Result Date: 07/04/2017 CLINICAL DATA:  Epigastric pain. EXAM: CT ABDOMEN AND PELVIS WITH CONTRAST TECHNIQUE: Multidetector CT imaging of the abdomen and pelvis was performed using the standard protocol following bolus administration of intravenous contrast. CONTRAST:  80mL ISOVUE-300 IOPAMIDOL (ISOVUE-300) INJECTION 61% COMPARISON:  Multiple prior pelvic ultrasounds. FINDINGS:  Lower chest: No acute abnormality. Hepatobiliary: No focal liver abnormality is seen. No gallstones, gallbladder wall thickening, or biliary dilatation. Pancreas: Unremarkable. Mild prominence of the pancreatic duct. No surrounding inflammatory changes. Spleen: Normal in size without focal abnormality. Adrenals/Urinary Tract: Adrenal glands are unremarkable. Kidneys are normal, without renal calculi, focal lesion, or hydronephrosis. Bladder is unremarkable. Stomach/Bowel: Stomach is within normal limits. Appendix appears normal. No evidence of bowel wall thickening, distention, or inflammatory changes. Vascular/Lymphatic: No significant vascular findings are present. No enlarged abdominal or pelvic lymph nodes. Reproductive: Prior tubal ligation. Moderate amount of fluid within the endometrium. Right corpus luteum. No adnexal mass. Other: Trace free fluid in the pelvis, likely physiologic. No abdominal wall hernia or abnormality. Musculoskeletal: No acute or significant osseous findings. IMPRESSION: 1. No acute intra-abdominal process. Electronically Signed   By: Obie Dredge M.D.   On:  07/04/2017 15:09    Procedures Procedures (including critical care time)  Medications Ordered in ED Medications  iopamidol (ISOVUE-300) 61 % injection (not administered)  sodium chloride 0.9 % bolus 1,000 mL (0 mLs Intravenous Stopped 07/04/17 1416)  ondansetron (ZOFRAN) injection 4 mg (4 mg Intravenous Given 07/04/17 1040)  morphine 2 MG/ML injection 2 mg (2 mg Intravenous Given 07/04/17 1040)  morphine 2 MG/ML injection 1 mg (1 mg Intravenous Given 07/04/17 1424)  iopamidol (ISOVUE-300) 61 % injection 100 mL (80 mLs Intravenous Contrast Given 07/04/17 1446)     Initial Impression / Assessment and Plan / ED Course  I have reviewed the triage vital signs and the nursing notes.  Pertinent labs & imaging results that were available during my care of the patient were reviewed by me and considered in my medical decision making (see chart for details).     Patient presents to ED for evaluation of abdominal pain that began now approx 11hrs prior to arrival. Reports intermittent nausea. No vomiting, diarrhea, constipation, vaginal discharge, urinary symptoms. Reports daily tobacco use and occasional alcohol use.  Prior abdominal surgery includes C-section. On physical exam there is tenderness to palpation of the epigastric region. No rebound or guarding. She is afebrile with no history of fever. CBC, CMP unremarkable. UA unremarkable. Upreg negative. Lipase slightly elevated at 66. CT of the abdomen and pelvis negative for acute intra-abdominal process, no evidence of pancreatitis. Patient's pain and symptoms controlled here in the ED with morphine and Zofran. Pain could be due to gastritis, but unsure considering the CT was negative. Advised to follow up with GI specialist for further evaluation if symptoms persist. Patient appears stable for discharge at this time. Strict return precautions given.  Final Clinical Impressions(s) / ED Diagnoses   Final diagnoses:  Epigastric pain    New  Prescriptions Discharge Medication List as of 07/04/2017  3:17 PM       Dietrich Pates, PA-C 07/04/17 1600    Melene Plan, DO 07/05/17 0720

## 2017-07-04 NOTE — Discharge Instructions (Signed)
Please read attached information regarding your condition. Follow-up with GI specialist listed below for further evaluation if symptoms persist. Return to ED for worsening pain, increased vomiting, blood in stool, bloody vomiting, loss of consciousness.

## 2017-07-04 NOTE — ED Triage Notes (Signed)
Pt is coming from home with complaints of abdominal pain that started at approx. 4 am this morning. Pt describes pain as "feeling like contractions" and rates pain 7/10. Pt reports currently being on her menstrual cycle. Pt also reports n/v and no diarrhea.

## 2017-07-08 ENCOUNTER — Encounter (HOSPITAL_COMMUNITY): Payer: Self-pay | Admitting: Obstetrics and Gynecology

## 2017-07-08 ENCOUNTER — Emergency Department (HOSPITAL_COMMUNITY)
Admission: EM | Admit: 2017-07-08 | Discharge: 2017-07-08 | Disposition: A | Discharge status: Home / Self Care | Payer: Self-pay | Attending: Emergency Medicine | Admitting: Emergency Medicine

## 2017-07-08 DIAGNOSIS — D649 Anemia, unspecified: Secondary | ICD-10-CM

## 2017-07-08 DIAGNOSIS — N921 Excessive and frequent menstruation with irregular cycle: Secondary | ICD-10-CM

## 2017-07-08 LAB — CBC
HCT: 31.2 % — ABNORMAL LOW (ref 36.0–46.0)
Hemoglobin: 10.1 g/dL — ABNORMAL LOW (ref 12.0–15.0)
MCH: 26.6 pg (ref 26.0–34.0)
MCHC: 32.4 g/dL (ref 30.0–36.0)
MCV: 82.1 fL (ref 78.0–100.0)
Platelets: 136 10*3/uL — ABNORMAL LOW (ref 150–400)
RBC: 3.8 MIL/uL — ABNORMAL LOW (ref 3.87–5.11)
RDW: 17.3 % — AB (ref 11.5–15.5)
WBC: 5.4 10*3/uL (ref 4.0–10.5)

## 2017-07-08 LAB — T4, FREE: FREE T4: 0.76 ng/dL (ref 0.61–1.12)

## 2017-07-08 LAB — BASIC METABOLIC PANEL
Anion gap: 6 (ref 5–15)
BUN: 8 mg/dL (ref 6–20)
CALCIUM: 8.6 mg/dL — AB (ref 8.9–10.3)
CO2: 25 mmol/L (ref 22–32)
Chloride: 110 mmol/L (ref 101–111)
Creatinine, Ser: 0.85 mg/dL (ref 0.44–1.00)
Glucose, Bld: 80 mg/dL (ref 65–99)
Potassium: 3.7 mmol/L (ref 3.5–5.1)
SODIUM: 141 mmol/L (ref 135–145)

## 2017-07-08 LAB — PROTIME-INR
INR: 1.12
PROTHROMBIN TIME: 14.5 s (ref 11.4–15.2)

## 2017-07-08 LAB — I-STAT BETA HCG BLOOD, ED (MC, WL, AP ONLY)

## 2017-07-08 LAB — TSH: TSH: 1.063 u[IU]/mL (ref 0.350–4.500)

## 2017-07-08 MED ORDER — SODIUM CHLORIDE 0.9 % IV BOLUS (SEPSIS)
1000.0000 mL | Freq: Once | INTRAVENOUS | Status: AC
  Administered 2017-07-08: 1000 mL via INTRAVENOUS

## 2017-07-08 NOTE — ED Notes (Addendum)
Pt's family member come to nurse and told nurse to "give her IV fluids whether she wants it or not because she is pissing him off" MD made aware and gave a verbal order to give fluids Charge nurse made aware

## 2017-07-08 NOTE — ED Notes (Signed)
Pt requesting Food MD made aware

## 2017-07-08 NOTE — ED Provider Notes (Addendum)
[facility]-EMERGENCY DEPT Provider Note   CSN: [id number] Arrival date & time: 07/08/17  1111     History   Chief Complaint Chief Complaint  Patient presents with  . Vaginal Bleeding    HPI Jaime Mendoza is a 26 y.o. female.  HPI Patient presents to the emergency room for evaluation of lightheadedness and heavy vaginal bleeding. Patient states she has a history of anemia. She is not sure what type of anemia she has but has had this since she was a child. She does know that she had significant blood loss during one of her pregnancies and received a blood transfusion. Patient states however she started having heavy menstrual bleeding on Monday. She has had very heavy bleeding the last day or so and when she got up this morning she felt fluid rushing out of her vagina and it was pure blood. She feels lightheaded and dizzy. Denies any abdominal pain. No fevers or chills.  Medical records show that the patient [facility] August [facility]. At that time she was having abdominal cramping.  The patient had laboratory tests and a CT scan. No acute findings were noted on the CAT scan. Past Medical History:  Diagnosis Date  . Anxiety   . Asthma   . Chlamydia   . Depression   . HSV-2 infection complicating pregnancy   . Preterm labor   . SVD (spontaneous vaginal delivery) 01/30/2012    Patient Active Problem List   Diagnosis Date Noted  . S/P cesarean section and BTS  09/24/2016  . History of PCR DNA positive for HSV2 09/18/2016  . History of premature delivery, currently pregnant 07/05/2016  . Supervision of normal intrauterine pregnancy in multigravida in first trimester 03/08/2016  . MDD (major depressive disorder), single episode 11/08/2014    Past Surgical History:  Procedure Laterality Date  . CESAREAN SECTION N/A 09/24/2016   Procedure: CESAREAN SECTION;  Surgeon: Tereso NewcomerUgonna A Anyanwu, MD;  Location: [facility];  Service: Obstetrics;  Laterality: N/A;  .  NO PAST SURGERIES      OB History    Gravida Para Term Preterm AB Living   7 5 4 1 2 5    SAB TAB Ectopic Multiple Live Births   2 0 0 0 5       Home Medications    Prior to Admission medications   Medication Sig Start Date End Date Taking? Authorizing Provider  coconut oil OIL Apply 1 application topically as needed. Patient not taking: Reported on 07/04/2017 09/26/16   Orvilla Cornwallenney, Rachelle A, CNM  ferrous sulfate 325 (65 FE) MG tablet Take 1 tablet (325 mg total) by mouth 3 (three) times daily with meals. Patient not taking: Reported on 07/04/2017 09/26/16   Orvilla Cornwallenney, Rachelle A, CNM  ibuprofen (ADVIL,MOTRIN) 600 MG tablet Take 1 tablet (600 mg total) by mouth every 6 (six) hours. Patient not taking: Reported on 07/04/2017 09/26/16   Orvilla Cornwallenney, Rachelle A, CNM  metroNIDAZOLE (FLAGYL) 500 MG tablet Take 1 tablet (500 mg total) by mouth 2 (two) times daily. Patient not taking: Reported on 07/04/2017 04/26/17   Barrett HenleNadeau, Nicole Elizabeth, PA-C  oxyCODONE-acetaminophen (PERCOCET/ROXICET) 5-325 MG tablet Take 2 tablets by mouth every 4 (four) hours as needed (pain scale > 7). Patient not taking: Reported on 07/04/2017 09/26/16   Orvilla Cornwallenney, Rachelle A, CNM  senna-docusate (SENOKOT-S) 8.6-50 MG tablet Take 2 tablets by mouth 2 (two) times daily. Patient not taking: Reported on 07/04/2017 09/26/16   Roe Coombsenney, Rachelle A,  CNM    Family History Family History  Problem Relation Age of Onset  . Cancer Father        colon  . Seizures Sister   . Diabetes Maternal Grandmother   . Heart disease Maternal Grandfather   . Heart disease Paternal Grandfather   . Anesthesia problems Neg Hx   . Hypotension Neg Hx   . Malignant hyperthermia Neg Hx   . Pseudochol deficiency Neg Hx     Social History Social History  Substance Use Topics  . Smoking status: Current Every Day Smoker    Packs/day: 0.25    Years: 5.00    Types: Cigarettes  . Smokeless tobacco: Never Used  . Alcohol use Yes     Comment: occasional -  NOT  WHILE PREG     Allergies   Patient has no known allergies.   Review of Systems Review of Systems  All other systems reviewed and are negative.    Physical Exam Updated Vital Signs BP 115/72 (BP Location: Right Arm)   Pulse 80   Temp 98.2 F (36.8 C) (Oral)   Resp 16   Ht 1.753 m (5\' 9" )   Wt 51.3 kg (113 lb)   LMP 07/02/2017 (Exact Date)   SpO2 98%   BMI 16.69 kg/m   Physical Exam  Constitutional: She appears well-developed and well-nourished. No distress.  HENT:  Head: Normocephalic and atraumatic.  Right Ear: External ear normal.  Left Ear: External ear normal.  Eyes: Conjunctivae are normal. Right eye exhibits no discharge. Left eye exhibits no discharge. No scleral icterus.  Neck: Neck supple. No tracheal deviation present.  Cardiovascular: Normal rate, regular rhythm and intact distal pulses.   Pulmonary/Chest: Effort normal and breath sounds normal. No stridor. No respiratory distress. She has no wheezes. She has no rales.  Abdominal: Soft. Bowel sounds are normal. She exhibits no distension. There is no tenderness. There is no rebound and no guarding.  Genitourinary: Uterus normal. Pelvic exam was performed with patient supine. There is no rash, tenderness or lesion on the right labia. There is no rash, tenderness or lesion on the left labia. Cervix exhibits no motion tenderness and no discharge. Right adnexum displays no mass, no tenderness and no fullness. Left adnexum displays no mass, no tenderness and no fullness. There is bleeding in the vagina.  Genitourinary Comments: Dark blood in the vaginal vault, no clots, no active bleeding noted from the cervix  Musculoskeletal: She exhibits no edema or tenderness.  Neurological: She is alert. She has normal strength. No cranial nerve deficit (no facial droop, extraocular movements intact, no slurred speech) or sensory deficit. She exhibits normal muscle tone. She displays no seizure activity. Coordination normal.    Skin: Skin is warm and dry. No rash noted. There is pallor.  Psychiatric: She has a normal mood and affect.  Nursing note and vitals reviewed.    ED Treatments / Results  Labs (all labs ordered are listed, but only abnormal results are displayed) Labs Reviewed  CBC - Abnormal; Notable for the following:       Result Value   RBC 3.80 (*)    Hemoglobin 10.1 (*)    HCT 31.2 (*)    RDW 17.3 (*)    Platelets 136 (*)    All other components within normal limits  BASIC METABOLIC PANEL - Abnormal; Notable for the following:    Calcium 8.6 (*)    All other components within normal limits  PROTIME-INR  TSH  T4, FREE  I-STAT BETA HCG BLOOD, ED ([facility], [facility], [facility] ONLY)  SAMPLE TO BLOOD BANK  GC/CHLAMYDIA PROBE AMP ([place]) NOT AT [facility]     Radiology No results found.  Procedures Procedures (including critical care time)  Medications Ordered in ED Medications  sodium chloride 0.9 % bolus 1,000 mL (0 mLs Intravenous Stopped 07/08/17 1348)     Initial Impression / Assessment and Plan / ED Course  I have reviewed the triage vital signs and the nursing notes.  Pertinent labs & imaging results that were available during my care of the patient were reviewed by me and considered in my medical decision making (see chart for details).  Clinical Course as of Jul 08 1356  Wynelle Link Jul 08, 2017  1239 HIV and RPR in May of this year negative.  [JK]    Clinical Course User Index [JK] Linwood Dibbles, MD   Patient presented to the emergency room with complaints of heavy vaginal bleeding. Initially the patient said she did not have a history of abnormal uterine bleeding however as we discussed her test results she mentioned having spoken to [facility] in the past about a possible hysterectomy. Patient states she asked if she could have a hysterectomy because of her vaginal bleeding but was told she was not a candidate.  Patient's laboratory tests are stable. Her hemoglobin is similar to the results a few  days ago. She is not having any heavy bleeding now on my exam. Patient's vital signs are stable.  She also asked about if there is anything that she can take to help her gain weight. I asked her if she had her thyroid checked. I will add on a thyroid panel and have her follow up with primary care doctor.  I mentioned taking estrogen/progesterone type medications to help her with her bleeding.  Pt states she has tried that before and does not want those medications  At this time there does not appear to be any evidence of an acute emergency medical condition and the patient appears stable for discharge with appropriate outpatient follow up.  Final Clinical Impressions(s) / ED Diagnoses   Final diagnoses:  Menorrhagia with irregular cycle  Anemia, unspecified type    New Prescriptions New Prescriptions   No medications on file       Linwood Dibbles, MD 07/08/17 1401

## 2017-07-08 NOTE — ED Notes (Addendum)
Pt given food and PO fluid with approval from MD. Pt states her IV is "irritating her" and does not understand "why [she] has an IV in if there ain't nothing going through it" RN explained to pt that the IV may be needed for treatment later so we don't want to remove it yet.  IV flushed to ensure it was patent. Blood return noted and IV flushes with no difficulty.

## 2017-07-08 NOTE — ED Notes (Signed)
Bed: WA01 Expected date:  Expected time:  Means of arrival:  Comments: 26 yo cramping, gen weakness

## 2017-07-08 NOTE — ED Triage Notes (Signed)
Per EMS: Pt reports she has had an extended period with heavy bleeding.  Pt reports period started last Monday and it lasted 2 days. Then it restarted this morning with heavy bleeding. Pt has a hx of anemia and has been feeling very dizzy. BP: 130/68 CBG 81 HR 86 NS with EMS.

## 2017-07-08 NOTE — ED Notes (Signed)
ED Provider at bedside. 

## 2017-07-08 NOTE — Discharge Instructions (Signed)
Follow-up with an OB/GYN doctor.  I have added on thyroid testing as we discussed bc of your weight loss troubles. Please follow up with the primary care doctor to review the results.

## 2017-07-09 LAB — GC/CHLAMYDIA PROBE AMP (~~LOC~~) NOT AT ARMC
Chlamydia: NEGATIVE
Neisseria Gonorrhea: NEGATIVE

## 2018-01-22 ENCOUNTER — Ambulatory Visit: Payer: Self-pay | Admitting: Family Medicine

## 2018-03-26 ENCOUNTER — Emergency Department (HOSPITAL_COMMUNITY)
Admission: EM | Admit: 2018-03-26 | Discharge: 2018-03-27 | Disposition: A | Discharge status: Home / Self Care | Payer: Self-pay | Attending: Emergency Medicine | Admitting: Emergency Medicine

## 2018-03-26 ENCOUNTER — Emergency Department (HOSPITAL_COMMUNITY): Payer: Self-pay

## 2018-03-26 ENCOUNTER — Encounter (HOSPITAL_COMMUNITY): Payer: Self-pay | Admitting: Emergency Medicine

## 2018-03-26 DIAGNOSIS — F10920 Alcohol use, unspecified with intoxication, uncomplicated: Secondary | ICD-10-CM | POA: Insufficient documentation

## 2018-03-26 DIAGNOSIS — S032XXA Dislocation of tooth, initial encounter: Secondary | ICD-10-CM | POA: Insufficient documentation

## 2018-03-26 DIAGNOSIS — Y92410 Unspecified street and highway as the place of occurrence of the external cause: Secondary | ICD-10-CM | POA: Insufficient documentation

## 2018-03-26 DIAGNOSIS — F1490 Cocaine use, unspecified, uncomplicated: Secondary | ICD-10-CM | POA: Insufficient documentation

## 2018-03-26 DIAGNOSIS — R000 Tachycardia, unspecified: Secondary | ICD-10-CM | POA: Insufficient documentation

## 2018-03-26 DIAGNOSIS — S022XXA Fracture of nasal bones, initial encounter for closed fracture: Secondary | ICD-10-CM | POA: Insufficient documentation

## 2018-03-26 DIAGNOSIS — F17210 Nicotine dependence, cigarettes, uncomplicated: Secondary | ICD-10-CM | POA: Insufficient documentation

## 2018-03-26 DIAGNOSIS — Y939 Activity, unspecified: Secondary | ICD-10-CM | POA: Insufficient documentation

## 2018-03-26 DIAGNOSIS — Z79899 Other long term (current) drug therapy: Secondary | ICD-10-CM | POA: Insufficient documentation

## 2018-03-26 DIAGNOSIS — F419 Anxiety disorder, unspecified: Secondary | ICD-10-CM | POA: Insufficient documentation

## 2018-03-26 DIAGNOSIS — Y998 Other external cause status: Secondary | ICD-10-CM | POA: Insufficient documentation

## 2018-03-26 DIAGNOSIS — S71132A Puncture wound without foreign body, left thigh, initial encounter: Secondary | ICD-10-CM | POA: Insufficient documentation

## 2018-03-26 DIAGNOSIS — F1590 Other stimulant use, unspecified, uncomplicated: Secondary | ICD-10-CM | POA: Insufficient documentation

## 2018-03-26 LAB — COMPREHENSIVE METABOLIC PANEL
ALT: 15 U/L (ref 14–54)
ANION GAP: 11 (ref 5–15)
AST: 29 U/L (ref 15–41)
Albumin: 4.1 g/dL (ref 3.5–5.0)
Alkaline Phosphatase: 45 U/L (ref 38–126)
BUN: 11 mg/dL (ref 6–20)
CHLORIDE: 108 mmol/L (ref 101–111)
CO2: 20 mmol/L — AB (ref 22–32)
Calcium: 9 mg/dL (ref 8.9–10.3)
Creatinine, Ser: 1.07 mg/dL — ABNORMAL HIGH (ref 0.44–1.00)
GFR calc non Af Amer: >60 mL/min (ref >60)
Glucose, Bld: 110 mg/dL — ABNORMAL HIGH (ref 65–99)
Potassium: 3.6 mmol/L (ref 3.5–5.1)
SODIUM: 139 mmol/L (ref 135–145)
Total Bilirubin: 0.4 mg/dL (ref 0.3–1.2)
Total Protein: 6.8 g/dL (ref 6.5–8.1)

## 2018-03-26 LAB — CBC WITH DIFFERENTIAL/PLATELET
BASOS ABS: 0 10*3/uL (ref 0.0–0.1)
Basophils Relative: 0 %
EOS ABS: 0.2 10*3/uL (ref 0.0–0.7)
EOS PCT: 2 %
HCT: 35 % — ABNORMAL LOW (ref 36.0–46.0)
Hemoglobin: 11.1 g/dL — ABNORMAL LOW (ref 12.0–15.0)
Lymphocytes Relative: 38 %
Lymphs Abs: 3.2 10*3/uL (ref 0.7–4.0)
MCH: 26.9 pg (ref 26.0–34.0)
MCHC: 31.7 g/dL (ref 30.0–36.0)
MCV: 84.7 fL (ref 78.0–100.0)
Monocytes Absolute: 0.5 10*3/uL (ref 0.1–1.0)
Monocytes Relative: 6 %
Neutro Abs: 4.7 10*3/uL (ref 1.7–7.7)
Neutrophils Relative %: 54 %
PLATELETS: 188 10*3/uL (ref 150–400)
RBC: 4.13 MIL/uL (ref 3.87–5.11)
RDW: 15.2 % (ref 11.5–15.5)
WBC: 8.5 10*3/uL (ref 4.0–10.5)

## 2018-03-26 LAB — PROTIME-INR
INR: 1.08
Prothrombin Time: 13.9 seconds (ref 11.4–15.2)

## 2018-03-26 LAB — URINALYSIS, ROUTINE W REFLEX MICROSCOPIC
Bilirubin Urine: NEGATIVE
GLUCOSE, UA: NEGATIVE mg/dL
HGB URINE DIPSTICK: NEGATIVE
Ketones, ur: NEGATIVE mg/dL
Leukocytes, UA: NEGATIVE
Nitrite: NEGATIVE
Protein, ur: NEGATIVE mg/dL
SPECIFIC GRAVITY, URINE: 1.009 (ref 1.005–1.030)
pH: 6 (ref 5.0–8.0)

## 2018-03-26 LAB — I-STAT CHEM 8, ED
BUN: 11 mg/dL (ref 6–20)
CALCIUM ION: 1.12 mmol/L — AB (ref 1.15–1.40)
CHLORIDE: 108 mmol/L (ref 101–111)
CREATININE: 1.2 mg/dL — AB (ref 0.44–1.00)
GLUCOSE: 105 mg/dL — AB (ref 65–99)
HCT: 36 % (ref 36.0–46.0)
Hemoglobin: 12.2 g/dL (ref 12.0–15.0)
Potassium: 3.5 mmol/L (ref 3.5–5.1)
Sodium: 142 mmol/L (ref 135–145)
TCO2: 22 mmol/L (ref 22–32)

## 2018-03-26 LAB — I-STAT BETA HCG BLOOD, ED (MC, WL, AP ONLY)

## 2018-03-26 LAB — RAPID URINE DRUG SCREEN, HOSP PERFORMED
AMPHETAMINES: POSITIVE — AB
BENZODIAZEPINES: NOT DETECTED
Barbiturates: NOT DETECTED
COCAINE: POSITIVE — AB
OPIATES: NOT DETECTED
Tetrahydrocannabinol: POSITIVE — AB

## 2018-03-26 LAB — ETHANOL: Alcohol, Ethyl (B): 174 mg/dL — ABNORMAL HIGH (ref <10)

## 2018-03-26 LAB — ABO/RH: ABO/RH(D): A POS

## 2018-03-26 LAB — TYPE AND SCREEN
ABO/RH(D): A POS
Antibody Screen: NEGATIVE

## 2018-03-26 LAB — APTT: aPTT: 25 seconds (ref 24–36)

## 2018-03-26 MED ORDER — SODIUM CHLORIDE 0.9 % IV BOLUS
1000.0000 mL | Freq: Once | INTRAVENOUS | Status: AC
  Administered 2018-03-26: 1000 mL via INTRAVENOUS

## 2018-03-26 MED ORDER — SODIUM CHLORIDE 0.9 % IV BOLUS
500.0000 mL | Freq: Once | INTRAVENOUS | Status: AC
  Administered 2018-03-26: 500 mL via INTRAVENOUS

## 2018-03-26 MED ORDER — CEFAZOLIN SODIUM-DEXTROSE 1-4 GM/50ML-% IV SOLN
1.0000 g | Freq: Once | INTRAVENOUS | Status: AC
  Administered 2018-03-26: 1 g via INTRAVENOUS
  Filled 2018-03-26: qty 50

## 2018-03-26 MED ORDER — FENTANYL CITRATE (PF) 100 MCG/2ML IJ SOLN
50.0000 ug | Freq: Once | INTRAMUSCULAR | Status: AC
  Administered 2018-03-26: 50 ug via INTRAVENOUS
  Filled 2018-03-26: qty 2

## 2018-03-26 MED ORDER — IOPAMIDOL (ISOVUE-370) INJECTION 76%
INTRAVENOUS | Status: AC
  Filled 2018-03-26: qty 100

## 2018-03-26 MED ORDER — CEPHALEXIN 500 MG PO CAPS: 500.0000 mg | ORAL_CAPSULE | Freq: Four times a day (QID) | ORAL | 0 refills | Status: DC

## 2018-03-26 MED ORDER — IBUPROFEN 600 MG PO TABS: 600.0000 mg | ORAL_TABLET | Freq: Four times a day (QID) | ORAL | 2 refills | Status: DC

## 2018-03-26 MED ORDER — METHOCARBAMOL 500 MG PO TABS: 1000.0000 mg | ORAL_TABLET | Freq: Three times a day (TID) | ORAL | 0 refills | Status: DC | PRN

## 2018-03-26 MED ORDER — IOPAMIDOL (ISOVUE-370) INJECTION 76%
100.0000 mL | Freq: Once | INTRAVENOUS | Status: AC | PRN
  Administered 2018-03-26: 100 mL via INTRAVENOUS

## 2018-03-26 MED ORDER — OXYCODONE-ACETAMINOPHEN 5-325 MG PO TABS: 1.0000 | ORAL_TABLET | Freq: Four times a day (QID) | ORAL | 0 refills | Status: DC | PRN

## 2018-03-26 MED ORDER — MORPHINE SULFATE (PF) 4 MG/ML IV SOLN
4.0000 mg | Freq: Once | INTRAVENOUS | Status: AC
  Administered 2018-03-26: 4 mg via INTRAVENOUS
  Filled 2018-03-26: qty 1
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
## 2018-03-27 NOTE — ED Notes (Signed)
Discharge instructions discussed with Pt. Pt verbalized understanding. Pt stable and ambulatory.    

## 2018-08-28 ENCOUNTER — Inpatient Hospital Stay (HOSPITAL_COMMUNITY)
Admission: AD | Admit: 2018-08-28 | Discharge: 2018-08-28 | Disposition: A | Discharge status: Home / Self Care | Payer: Self-pay | Source: Ambulatory Visit | Attending: Obstetrics and Gynecology | Admitting: Obstetrics and Gynecology

## 2018-08-28 ENCOUNTER — Encounter (HOSPITAL_COMMUNITY): Payer: Self-pay

## 2018-08-28 ENCOUNTER — Other Ambulatory Visit: Payer: Self-pay

## 2018-08-28 DIAGNOSIS — F12988 Cannabis use, unspecified with other cannabis-induced disorder: Secondary | ICD-10-CM

## 2018-08-28 DIAGNOSIS — F1290 Cannabis use, unspecified, uncomplicated: Secondary | ICD-10-CM

## 2018-08-28 DIAGNOSIS — R519 Headache, unspecified: Secondary | ICD-10-CM

## 2018-08-28 DIAGNOSIS — R112 Nausea with vomiting, unspecified: Secondary | ICD-10-CM

## 2018-08-28 DIAGNOSIS — R1116 Cannabis hyperemesis syndrome: Secondary | ICD-10-CM

## 2018-08-28 DIAGNOSIS — R51 Headache: Secondary | ICD-10-CM | POA: Insufficient documentation

## 2018-08-28 LAB — URINALYSIS, ROUTINE W REFLEX MICROSCOPIC
Bilirubin Urine: NEGATIVE
GLUCOSE, UA: NEGATIVE mg/dL
HGB URINE DIPSTICK: NEGATIVE
KETONES UR: 5 mg/dL — AB
Nitrite: NEGATIVE
PROTEIN: NEGATIVE mg/dL
Specific Gravity, Urine: 1.021 (ref 1.005–1.030)
pH: 5 (ref 5.0–8.0)

## 2018-08-28 LAB — RAPID URINE DRUG SCREEN, HOSP PERFORMED
AMPHETAMINES: NOT DETECTED
BENZODIAZEPINES: NOT DETECTED
Barbiturates: NOT DETECTED
Cocaine: POSITIVE — AB
Opiates: NOT DETECTED
Tetrahydrocannabinol: POSITIVE — AB

## 2018-08-28 LAB — POCT PREGNANCY, URINE: PREG TEST UR: NEGATIVE

## 2018-08-28 MED ORDER — ONDANSETRON 8 MG PO TBDP
8.0000 mg | ORAL_TABLET | Freq: Once | ORAL | Status: AC
  Administered 2018-08-28: 8 mg via ORAL
  Filled 2018-08-28: qty 1

## 2018-08-28 MED ORDER — KETOROLAC TROMETHAMINE 60 MG/2ML IM SOLN
60.0000 mg | Freq: Once | INTRAMUSCULAR | Status: DC
  Filled 2018-08-28: qty 2

## 2018-08-28 MED ORDER — ONDANSETRON 8 MG PO TBDP: 8.0000 mg | ORAL_TABLET | Freq: Three times a day (TID) | ORAL | 0 refills | Status: DC | PRN

## 2018-08-28 NOTE — Discharge Instructions (Signed)
In late 2019, the [facility] will be moving to the [facility] campus. At that time, the MAU (Maternity Admissions Unit), where you are being seen today, will no longer take care of non-pregnant patients. We strongly encourage you to find a doctor's office before that time, so that you can be seen with any GYN concerns, like vaginal discharge, urinary tract infection, etc.. in a timely manner.  In order to make an office visit more convenient, the Center for [facility] at [facility] will be offering evening hours with same-day appointments, walk-in appointments and scheduled appointments available during this time.  Center for [facility] @ [facility] Hours: Monday - 8am - 7:30 pm with walk-in between 4pm- 7:30 pm Tuesday - 8am - 7:30 pm with walk-in between 4pm- 7:30 pm Wednesday - 8am - 7:30 pm with walk-in between 4pm- 7:30 pm Thursday 8 am - 5 pm (starting 09/05/18 we will be open late and accepting walk-ins from 4pm - 7:30pm) Friday 8 am - 5 pm  For an appointment please call the Center for [facility] @ [facility] at [phone]  For urgent needs, [facility] is also available for management of urgent GYN complaints such as vaginal discharge or urinary tract infections.     Primary care follow up  [facility] (will see you even if you do not have sickle cell): [phone] [facility]: [phone] [facility]: [phone] [facility] [phone]

## 2018-08-28 NOTE — MAU Provider Note (Signed)
History     CSN: [id number]  Arrival date and time: 08/28/18 1191   First Provider Initiated Contact with Patient 08/28/18 2003      Chief Complaint  Patient presents with  . Emesis   Jaime Mendoza is a a 27 y.o. [id number] who presents today with nausea, vomiting and headache x 1 week. She has not taken anything for this at home. She is certain that she is not pregnant as she has had a BTL. However, she came here so she did not have to wait as long.   Emesis   This is a new problem. The current episode started in the past 7 days. The problem occurs 2 to 4 times per day. The problem has been unchanged. The emesis has an appearance of stomach contents. There has been no fever. Associated symptoms include dizziness. Pertinent negatives include no chills, diarrhea or fever. Risk factors: pregnancy  She has tried nothing for the symptoms.    OB History    Gravida  7   Para  5   Term  4   Preterm  1   AB  2   Living  5     SAB  2   TAB  0   Ectopic  0   Multiple  0   Live Births  5           Past Medical History:  Diagnosis Date  . Anxiety   . Asthma   . Chlamydia   . Depression   . HSV-2 infection complicating pregnancy   . Preterm labor   . SVD (spontaneous vaginal delivery) 01/30/2012    Past Surgical History:  Procedure Laterality Date  . CESAREAN SECTION N/A 09/24/2016   Procedure: CESAREAN SECTION;  Surgeon: Tereso Newcomer, MD;  Location: [facility];  Service: Obstetrics;  Laterality: N/A;  . NO PAST SURGERIES      Family History  Problem Relation Age of Onset  . Cancer Father        colon  . Seizures Sister   . Diabetes Maternal Grandmother   . Heart disease Maternal Grandfather   . Heart disease Paternal Grandfather   . Anesthesia problems Neg Hx   . Hypotension Neg Hx   . Malignant hyperthermia Neg Hx   . Pseudochol deficiency Neg Hx     Social History   Tobacco Use  . Smoking status: Current Every Day Smoker     Packs/day: 0.25    Years: 5.00    Pack years: 1.25    Types: Cigarettes  . Smokeless tobacco: Never Used  Substance Use Topics  . Alcohol use: Yes    Comment: socially  . Drug use: Yes    Types: Cocaine, Marijuana    Comment: Positive for Cocaine, THC, Amphetaines     Allergies: No Known Allergies  Medications Prior to Admission  Medication Sig Dispense Refill Last Dose  . cephALEXin (KEFLEX) 500 MG capsule Take 1 capsule (500 mg total) by mouth 4 (four) times daily. 28 capsule 0   . coconut oil OIL Apply 1 application topically as needed. 500 mL PRN Not Taking at Unknown time  . ferrous sulfate 325 (65 FE) MG tablet Take 1 tablet (325 mg total) by mouth 3 (three) times daily with meals. 120 tablet 3 Not Taking at Unknown time  . ibuprofen (ADVIL,MOTRIN) 600 MG tablet Take 1 tablet (600 mg total) by mouth every 6 (six) hours. 120 tablet 2   . methocarbamol (ROBAXIN) 500  MG tablet Take 2 tablets (1,000 mg total) by mouth every 8 (eight) hours as needed. 30 tablet 0   . metroNIDAZOLE (FLAGYL) 500 MG tablet Take 1 tablet (500 mg total) by mouth 2 (two) times daily. 14 tablet 0 Not Taking at Unknown time  . oxyCODONE-acetaminophen (PERCOCET/ROXICET) 5-325 MG tablet Take 1 tablet by mouth every 6 (six) hours as needed for severe pain. 6 tablet 0   . senna-docusate (SENOKOT-S) 8.6-50 MG tablet Take 2 tablets by mouth 2 (two) times daily. 90 tablet 2 Not Taking at Unknown time    Review of Systems  Constitutional: Negative for chills and fever.  Gastrointestinal: Positive for nausea and vomiting. Negative for diarrhea.  Genitourinary: Negative for dysuria, pelvic pain, vaginal bleeding and vaginal discharge.  Neurological: Positive for dizziness.   Physical Exam   Blood pressure 111/75, pulse 93, temperature 97.9 F (36.6 C), resp. rate 12, weight 49 kg, last menstrual period 07/04/2018, SpO2 100 %, unknown if currently breastfeeding.  Physical Exam  Nursing note and vitals  reviewed. Constitutional: She is oriented to person, place, and time. She appears well-developed and well-nourished. No distress.  HENT:  Head: Normocephalic.  Cardiovascular: Normal rate.  Respiratory: Effort normal.  GI: Soft. There is no tenderness. There is no rebound.  Neurological: She is alert and oriented to person, place, and time.  Skin: Skin is warm and dry.  Psychiatric: She has a normal mood and affect.   Results for orders placed or performed during the hospital encounter of 08/28/18 (from the past 24 hour(s))  Urinalysis, Routine w reflex microscopic     Status: Abnormal   Collection Time: 08/28/18  7:31 PM  Result Value Ref Range   Color, Urine YELLOW YELLOW   APPearance HAZY (A) CLEAR   Specific Gravity, Urine 1.021 1.005 - 1.030   pH 5.0 5.0 - 8.0   Glucose, UA NEGATIVE NEGATIVE mg/dL   Hgb urine dipstick NEGATIVE NEGATIVE   Bilirubin Urine NEGATIVE NEGATIVE   Ketones, ur 5 (A) NEGATIVE mg/dL   Protein, ur NEGATIVE NEGATIVE mg/dL   Nitrite NEGATIVE NEGATIVE   Leukocytes, UA TRACE (A) NEGATIVE   RBC / HPF 6-10 0 - 5 RBC/hpf   WBC, UA 11-20 0 - 5 WBC/hpf   Bacteria, UA FEW (A) NONE SEEN   Squamous Epithelial / LPF 6-10 0 - 5   Mucus PRESENT   Urine rapid drug screen (hosp performed)     Status: Abnormal   Collection Time: 08/28/18  7:31 PM  Result Value Ref Range   Opiates NONE DETECTED NONE DETECTED   Cocaine POSITIVE (A) NONE DETECTED   Benzodiazepines NONE DETECTED NONE DETECTED   Amphetamines NONE DETECTED NONE DETECTED   Tetrahydrocannabinol POSITIVE (A) NONE DETECTED   Barbiturates NONE DETECTED NONE DETECTED  Pregnancy, urine POC     Status: None   Collection Time: 08/28/18  7:34 PM  Result Value Ref Range   Preg Test, Ur NEGATIVE NEGATIVE    MAU Course  Procedures  MDM DW patient about potential for cannabis hyperemesis. She has had zofran and toradol here in MAU. She reports that her headache has improved. She has not vomited here, and is  tolerating PO.   Assessment and Plan   1. Nausea and vomiting, intractability of vomiting not specified, unspecified vomiting type   2. Nonintractable headache, unspecified chronicity pattern, unspecified headache type   3. Cannabinoid hyperemesis syndrome (HCC)    DC home Comfort measures reviewed  RX: zofran 8mg  ODT PRN  Return to MAU as needed FU with PCP    Thressa Sheller 08/28/2018, 8:04 PM

## 2018-08-28 NOTE — MAU Note (Signed)
Pt states she started vomiting on Monday. She has not been able to keep anything down since then.   Reports feeling lightheaded.   Pt reports a headache for 2 days

## 2018-08-31 LAB — URINE CULTURE: Culture: >=100000 — AB

## 2018-09-01 ENCOUNTER — Telehealth: Payer: Self-pay | Admitting: Certified Nurse Midwife

## 2018-09-01 NOTE — Telephone Encounter (Signed)
LMTCB

## 2018-09-03 ENCOUNTER — Telehealth: Payer: Self-pay | Admitting: Certified Nurse Midwife

## 2018-09-03 MED ORDER — SULFAMETHOXAZOLE-TRIMETHOPRIM 800-160 MG PO TABS: 1.0000 | ORAL_TABLET | Freq: Two times a day (BID) | ORAL | 0 refills | Status: DC

## 2018-09-03 NOTE — Telephone Encounter (Signed)
+  [facility]I, pt notified. Rx for Bactrim. Encouraged increased water intake.

## 2018-10-29 ENCOUNTER — Ambulatory Visit (HOSPITAL_COMMUNITY)
Admission: RE | Admit: 2018-10-29 | Discharge: 2018-10-29 | Disposition: A | Discharge status: Home / Self Care | Payer: Self-pay | Attending: Psychiatry | Admitting: Psychiatry

## 2018-10-29 DIAGNOSIS — F332 Major depressive disorder, recurrent severe without psychotic features: Secondary | ICD-10-CM | POA: Insufficient documentation

## 2018-10-29 DIAGNOSIS — F419 Anxiety disorder, unspecified: Secondary | ICD-10-CM | POA: Insufficient documentation

## 2018-10-29 DIAGNOSIS — J45909 Unspecified asthma, uncomplicated: Secondary | ICD-10-CM | POA: Insufficient documentation

## 2018-10-29 DIAGNOSIS — F17210 Nicotine dependence, cigarettes, uncomplicated: Secondary | ICD-10-CM | POA: Insufficient documentation

## 2018-10-30 ENCOUNTER — Other Ambulatory Visit: Payer: Self-pay

## 2018-10-30 ENCOUNTER — Inpatient Hospital Stay (HOSPITAL_COMMUNITY)
Admission: AD | Admit: 2018-10-30 | Discharge: 2018-11-01 | DRG: 885 | Disposition: A | Discharge status: Home / Self Care | Payer: Federal, State, Local not specified - Other | Attending: Psychiatry | Admitting: Psychiatry

## 2018-10-30 ENCOUNTER — Encounter (HOSPITAL_COMMUNITY): Payer: Self-pay

## 2018-10-30 DIAGNOSIS — Z833 Family history of diabetes mellitus: Secondary | ICD-10-CM

## 2018-10-30 DIAGNOSIS — R634 Abnormal weight loss: Secondary | ICD-10-CM | POA: Diagnosis present

## 2018-10-30 DIAGNOSIS — Z800 Family history of malignant neoplasm of digestive organs: Secondary | ICD-10-CM | POA: Diagnosis not present

## 2018-10-30 DIAGNOSIS — F419 Anxiety disorder, unspecified: Secondary | ICD-10-CM | POA: Diagnosis not present

## 2018-10-30 DIAGNOSIS — F1410 Cocaine abuse, uncomplicated: Secondary | ICD-10-CM | POA: Diagnosis present

## 2018-10-30 DIAGNOSIS — F1210 Cannabis abuse, uncomplicated: Secondary | ICD-10-CM | POA: Diagnosis present

## 2018-10-30 DIAGNOSIS — Z596 Low income: Secondary | ICD-10-CM

## 2018-10-30 DIAGNOSIS — F332 Major depressive disorder, recurrent severe without psychotic features: Principal | ICD-10-CM | POA: Diagnosis present

## 2018-10-30 DIAGNOSIS — Z681 Body mass index (BMI) 19 or less, adult: Secondary | ICD-10-CM

## 2018-10-30 DIAGNOSIS — Z8249 Family history of ischemic heart disease and other diseases of the circulatory system: Secondary | ICD-10-CM

## 2018-10-30 DIAGNOSIS — G4700 Insomnia, unspecified: Secondary | ICD-10-CM | POA: Diagnosis not present

## 2018-10-30 LAB — COMPREHENSIVE METABOLIC PANEL
ALT: 13 U/L (ref 0–44)
AST: 18 U/L (ref 15–41)
Albumin: 4.1 g/dL (ref 3.5–5.0)
Alkaline Phosphatase: 45 U/L (ref 38–126)
Anion gap: 8 (ref 5–15)
BUN: 15 mg/dL (ref 6–20)
CHLORIDE: 107 mmol/L (ref 98–111)
CO2: 25 mmol/L (ref 22–32)
Calcium: 9.3 mg/dL (ref 8.9–10.3)
Creatinine, Ser: 0.93 mg/dL (ref 0.44–1.00)
GFR calc Af Amer: >60 mL/min (ref >60)
Glucose, Bld: 92 mg/dL (ref 70–99)
Potassium: 4 mmol/L (ref 3.5–5.1)
Sodium: 140 mmol/L (ref 135–145)
Total Bilirubin: 0.8 mg/dL (ref 0.3–1.2)
Total Protein: 7.2 g/dL (ref 6.5–8.1)

## 2018-10-30 LAB — RAPID URINE DRUG SCREEN, HOSP PERFORMED
Amphetamines: NOT DETECTED
BENZODIAZEPINES: NOT DETECTED
Barbiturates: NOT DETECTED
Cocaine: POSITIVE — AB
Opiates: NOT DETECTED
Tetrahydrocannabinol: POSITIVE — AB

## 2018-10-30 LAB — CBC
HEMATOCRIT: 38.3 % (ref 36.0–46.0)
Hemoglobin: 11.5 g/dL — ABNORMAL LOW (ref 12.0–15.0)
MCH: 26.6 pg (ref 26.0–34.0)
MCHC: 30 g/dL (ref 30.0–36.0)
MCV: 88.5 fL (ref 80.0–100.0)
NRBC: 0 % (ref 0.0–0.2)
PLATELETS: 185 10*3/uL (ref 150–400)
RBC: 4.33 MIL/uL (ref 3.87–5.11)
RDW: 16.1 % — ABNORMAL HIGH (ref 11.5–15.5)
WBC: 5.9 10*3/uL (ref 4.0–10.5)

## 2018-10-30 LAB — LIPID PANEL
CHOL/HDL RATIO: 3.5 ratio
Cholesterol: 163 mg/dL (ref 0–200)
HDL: 46 mg/dL (ref >40)
LDL CALC: 99 mg/dL (ref 0–99)
Triglycerides: 88 mg/dL (ref <150)
VLDL: 18 mg/dL (ref 0–40)

## 2018-10-30 LAB — HEMOGLOBIN A1C
HEMOGLOBIN A1C: 5.2 % (ref 4.8–5.6)
Mean Plasma Glucose: 102.54 mg/dL

## 2018-10-30 LAB — PREGNANCY, URINE: PREG TEST UR: NEGATIVE

## 2018-10-30 LAB — TSH: TSH: 1.926 u[IU]/mL (ref 0.350–4.500)

## 2018-10-30 MED ORDER — ENSURE ENLIVE PO LIQD
237.0000 mL | Freq: Two times a day (BID) | ORAL | Status: DC
  Administered 2018-10-30 – 2018-11-01 (×4): 237 mL via ORAL

## 2018-10-30 MED ORDER — HYDROXYZINE HCL 25 MG PO TABS
25.0000 mg | ORAL_TABLET | Freq: Three times a day (TID) | ORAL | Status: DC | PRN
  Administered 2018-10-30: 25 mg via ORAL
  Filled 2018-10-30: qty 1

## 2018-10-30 MED ORDER — MAGNESIUM HYDROXIDE 400 MG/5ML PO SUSP: 30.0000 mL | Freq: Every day | ORAL | Status: DC | PRN

## 2018-10-30 MED ORDER — SERTRALINE HCL 25 MG PO TABS
25.0000 mg | ORAL_TABLET | Freq: Every day | ORAL | Status: DC
  Administered 2018-10-30 – 2018-10-31 (×2): 25 mg via ORAL
  Filled 2018-10-30 (×2): qty 1

## 2018-10-30 MED ORDER — NICOTINE 14 MG/24HR TD PT24
14.0000 mg | MEDICATED_PATCH | Freq: Every day | TRANSDERMAL | Status: DC
  Administered 2018-10-31: 14 mg via TRANSDERMAL
  Filled 2018-10-30 (×7): qty 1

## 2018-10-30 MED ORDER — ADULT MULTIVITAMIN W/MINERALS CH
1.0000 | ORAL_TABLET | Freq: Every day | ORAL | Status: DC
  Administered 2018-10-30 – 2018-11-01 (×3): 1 via ORAL
  Filled 2018-10-30 (×6): qty 1

## 2018-10-30 MED ORDER — LORAZEPAM 0.5 MG PO TABS: 0.5000 mg | ORAL_TABLET | Freq: Four times a day (QID) | ORAL | Status: DC | PRN

## 2018-10-30 MED ORDER — ALUM & MAG HYDROXIDE-SIMETH 200-200-20 MG/5ML PO SUSP: 30.0000 mL | ORAL | Status: DC | PRN

## 2018-10-30 MED ORDER — ACETAMINOPHEN 325 MG PO TABS: 650.0000 mg | ORAL_TABLET | Freq: Four times a day (QID) | ORAL | Status: DC | PRN

## 2018-10-30 MED ORDER — OLANZAPINE 5 MG PO TABS
5.0000 mg | ORAL_TABLET | Freq: Every day | ORAL | Status: DC
  Administered 2018-10-30 – 2018-10-31 (×2): 5 mg via ORAL
  Filled 2018-10-30: qty 1
  Filled 2018-10-30: qty 7
  Filled 2018-10-30 (×2): qty 1
  Filled 2018-10-30: qty 7

## 2018-10-30 MED ORDER — SERTRALINE HCL 25 MG PO TABS
ORAL_TABLET | ORAL | Status: AC
  Administered 2018-10-30: 25 mg via ORAL
  Filled 2018-10-30: qty 1

## 2018-10-30 MED ORDER — TRAZODONE HCL 50 MG PO TABS
50.0000 mg | ORAL_TABLET | Freq: Every evening | ORAL | Status: DC | PRN
  Administered 2018-10-30 – 2018-10-31 (×2): 50 mg via ORAL
  Filled 2018-10-30 (×2): qty 1
  Filled 2018-10-30: qty 7

## 2018-10-30 NOTE — BH Assessment (Signed)
Assessment Note  Jaime Mendoza is a 27 y.o. female who came to [facility] due to ongoing depressive thoughts she has been experiencing. Pt shares she has been trying to fight against these depressive thoughts--and a lot of anger--"for a minute." Pt acknowledges she's been experiencing SI, though without a plan. She denies any previous attempts and shares she has been hospitalized on one occasion, which was 3-4 years ago. Pt denies HI, AVH, and NSSIB.  Pt shares she has been using marijuana daily and cocaine a few times per week. She denies any access to weapons and states she has court on November 21, 2018 for marijuana possession. She states she is single and that she lives by herself.  Pt shares she has been getting around 3 hours of sleep per night. She states her appetite has been so-so but that she's lost more than 10 pounds recently. She states she hasn't seen a therapist since she was around age 27 and that she's never seen a psychiatrist. She shares she's capable of completing her ADLs independently. Her depression symptoms include hopelessness, worthlessness, anger, isolating, a decrease in pleasurable, more time in bed, a decrease in showering, and reduced hygiene.   Pt is oriented x4. Her recent and remote memory is intact. She was cooperative, yet tearful, throughout the assessment process. Pt's insight, judgement, and impulse control is impaired at this time.   Diagnosis: F33.2, Major depressive disorder, Recurrent episode, Severe   Past Medical History:  Past Medical History:  Diagnosis Date  . Anxiety   . Asthma   . Chlamydia   . Depression   . HSV-2 infection complicating pregnancy   . Preterm labor   . SVD (spontaneous vaginal delivery) 01/30/2012    Past Surgical History:  Procedure Laterality Date  . CESAREAN SECTION N/A 09/24/2016   Procedure: CESAREAN SECTION;  Surgeon: Tereso Newcomer, MD;  Location: [facility];  Service: Obstetrics;  Laterality: N/A;   . NO PAST SURGERIES      Family History:  Family History  Problem Relation Age of Onset  . Cancer Father        colon  . Seizures Sister   . Diabetes Maternal Grandmother   . Heart disease Maternal Grandfather   . Heart disease Paternal Grandfather   . Anesthesia problems Neg Hx   . Hypotension Neg Hx   . Malignant hyperthermia Neg Hx   . Pseudochol deficiency Neg Hx     Social History:  reports that she has been smoking cigarettes. She has a 1.25 pack-year smoking history. She has never used smokeless tobacco. She reports that she drinks alcohol. She reports that she has current or past drug history. Drugs: Cocaine and Marijuana.  Additional Social History:  Alcohol / Drug Use Pain Medications: Please see MAR Prescriptions: Please see MAR Over the Counter: Please see MAR History of alcohol / drug use?: Yes Longest period of sobriety (when/how long): Unknown Substance #1 Name of Substance 1: Cocaine 1 - Age of First Use: 18 1 - Amount (size/oz): 1/2 gram 1 - Frequency: A few times/week 1 - Duration: Unknown 1 - Last Use / Amount: Saturday (10/26/18) Substance #2 Name of Substance 2: Marijuana 2 - Age of First Use: 15 2 - Amount (size/oz): 1/8 2 - Frequency: Daily 2 - Duration: Unknown 2 - Last Use / Amount: Today  CIWA:   COWS:    Allergies: No Known Allergies  Home Medications:  (Not in a hospital admission)  OB/GYN Status:  No LMP recorded.  General Assessment Data Location of Assessment: [facility] Assessment Services TTS Assessment: In system Is this a Tele or Face-to-Face Assessment?: Face-to-Face Is this an Initial Assessment or a Re-assessment for this encounter?: Initial Assessment Patient Accompanied by:: N/A Language Other than English: No Living Arrangements: Other (Comment)(Pt lives independently in her own home) What gender do you identify as?: Female Marital status: Single Maiden name: Ospina Pregnancy Status: No Living Arrangements: Alone Can pt  return to current living arrangement?: Yes Admission Status: Voluntary Is patient capable of signing voluntary admission?: Yes Referral Source: Self/Family/Friend Insurance type: None  Medical Screening Exam [facility] Walk-in ONLY) Medical Exam completed: Yes  Crisis Care Plan Living Arrangements: Alone Legal Guardian: (N/A) Name of Psychiatrist: None Name of Therapist: None  Education Status Is patient currently in school?: No Is the patient employed, unemployed or receiving disability?: Unemployed  Risk to self with the past 6 months Suicidal Ideation: Yes-Currently Present Has patient been a risk to self within the past 6 months prior to admission? : Yes Suicidal Intent: No Has patient had any suicidal intent within the past 6 months prior to admission? : No Is patient at risk for suicide?: No Suicidal Plan?: No Has patient had any suicidal plan within the past 6 months prior to admission? : No Access to Means: No What has been your use of drugs/alcohol within the last 12 months?: Pt admits to cocaine and marijuana use Previous Attempts/Gestures: No How many times?: 0 Other Self Harm Risks: None noted Triggers for Past Attempts: None known Intentional Self Injurious Behavior: None Family Suicide History: Yes(Pt's brother attempted to kill himself) Recent stressful life event(s): Job Loss Persecutory voices/beliefs?: No Depression: Yes Depression Symptoms: Despondent, Tearfulness, Isolating, Guilt, Loss of interest in usual pleasures, Feeling worthless/self pity, Feeling angry/irritable Substance abuse history and/or treatment for substance abuse?: No Suicide prevention information given to non-admitted patients: Not applicable  Risk to Others within the past 6 months Homicidal Ideation: No Does patient have any lifetime risk of violence toward others beyond the six months prior to admission? : No Thoughts of Harm to Others: No Current Homicidal Intent: No Current  Homicidal Plan: No Access to Homicidal Means: No Identified Victim: None noted History of harm to others?: No Assessment of Violence: On admission Violent Behavior Description: None noted Does patient have access to weapons?: No(Pt denies) Criminal Charges Pending?: No Does patient have a court date: Yes Court Date: 11/21/18(For marijuana possession) Is patient on probation?: No  Psychosis Hallucinations: None noted Delusions: None noted  Mental Status Report Appearance/Hygiene: Unremarkable Eye Contact: Fair Motor Activity: Other (Comment)(Pt was hunched over for a majority of the assessment) Speech: Logical/coherent Level of Consciousness: Crying, Alert Mood: Depressed Affect: Depressed Anxiety Level: Minimal Thought Processes: Coherent, Relevant Judgement: Impaired Orientation: Person, Place, Time, Situation Obsessive Compulsive Thoughts/Behaviors: Minimal  Cognitive Functioning Concentration: Normal Memory: Recent Intact, Remote Impaired Is patient IDD: No Insight: Fair Impulse Control: Poor Appetite: Fair Have you had any weight changes? : Loss Amount of the weight change? (lbs): 10 lbs Sleep: Decreased Total Hours of Sleep: 4 Vegetative Symptoms: Staying in bed, Not bathing, Decreased grooming  ADLScreening [facility] Assessment Services) Patient's cognitive ability adequate to safely complete daily activities?: Yes Patient able to express need for assistance with ADLs?: Yes Independently performs ADLs?: Yes (appropriate for developmental age)  Prior Inpatient Therapy Prior Inpatient Therapy: Yes Prior Therapy Dates: "3-4 years ago" Prior Therapy Facilty/Provider([facility]): [facility] Reason for Treatment: MH/SI  Prior Outpatient Therapy Prior  Outpatient Therapy: No Does patient have an ACCT team?: No Does patient have Intensive In-House Services?  : No Does patient have [facility] services? : No Does patient have P4CC services?: No  ADL Screening (condition at  time of admission) Patient's cognitive ability adequate to safely complete daily activities?: Yes Is the patient deaf or have difficulty hearing?: No Does the patient have difficulty seeing, even when wearing glasses/contacts?: No Does the patient have difficulty concentrating, remembering, or making decisions?: No Patient able to express need for assistance with ADLs?: Yes Does the patient have difficulty dressing or bathing?: No Independently performs ADLs?: Yes (appropriate for developmental age) Does the patient have difficulty walking or climbing stairs?: No Weakness of Legs: None Weakness of Arms/Hands: None     Therapy Consults (therapy consults require a physician order) PT Evaluation Needed: No OT Evalulation Needed: No SLP Evaluation Needed: No Abuse/Neglect Assessment (Assessment to be complete while patient is alone) Abuse/Neglect Assessment Can Be Completed: Yes Physical Abuse: Denies Verbal Abuse: Denies Sexual Abuse: Denies Exploitation of patient/patient's resources: Denies Self-Neglect: Denies Values / Beliefs Cultural Requests During Hospitalization: None Spiritual Requests During Hospitalization: None Consults Spiritual Care Consult Needed: No Social Work Consult Needed: No Merchant navy officerAdvance Directives (For Healthcare) Does Patient Have a Medical Advance Directive?: No Would patient like information on creating a medical advance directive?: No - Patient declined     Child/Adolescent Assessment Running Away Risk: Admits  Disposition: Nira ConnJason Berry NP reviewed pt's chart and information and determined she meets criteria for inpatient hospitalization. [facility].   Disposition Initial Assessment Completed for this Encounter: Yes Disposition of Patient: Admit(Jason Allyson SabalBerry NP determined pt meets inpt hosp criteria) Type of inpatient treatment program: Adult Patient refused recommended treatment: No Mode of transportation if  patient is discharged?: N/A Patient referred to: Other (Comment)([facility]  On Site Evaluation by:   Reviewed with Physician:    Ralph DowdySamantha L Taleshia Luff 10/30/2018 12:24 AM

## 2018-10-30 NOTE — H&P (Signed)
Behavioral Health Medical Screening Exam  Naveen D Clelia CroftShaw is an 27 y.o. female.  Total Time spent with patient: 20 minutes  Psychiatric Specialty Exam: Physical Exam  Constitutional: She is oriented to person, place, and time. She appears well-developed and well-nourished. No distress.  HENT:  Head: Normocephalic and atraumatic.  Right Ear: External ear normal.  Left Ear: External ear normal.  Eyes: Pupils are equal, round, and reactive to light. Right eye exhibits no discharge. Left eye exhibits no discharge.  Respiratory: Effort normal. No respiratory distress.  Musculoskeletal: Normal range of motion.  Neurological: She is alert and oriented to person, place, and time.  Skin: She is not diaphoretic.  Psychiatric: Her mood appears anxious. She is not actively hallucinating. Thought content is not paranoid and not delusional. Cognition and memory are normal. She exhibits a depressed mood. She expresses no homicidal and no suicidal ideation.    Review of Systems  Constitutional: Negative for chills, fever and weight loss.  Psychiatric/Behavioral: Positive for depression and hallucinations. Negative for memory loss. The patient is nervous/anxious. The patient does not have insomnia.   All other systems reviewed and are negative.   unknown if currently breastfeeding.There is no height or weight on file to calculate BMI.  General Appearance: Disheveled  Eye Contact:  Fair  Speech:  Clear and Coherent and Normal Rate  Volume:  Decreased  Mood:  Anxious, Depressed, Dysphoric, Hopeless, Irritable and Worthless  Affect:  Congruent, Depressed and Tearful  Thought Process:  Coherent, Goal Directed and Descriptions of Associations: Intact  Orientation:  Full (Time, Place, and Person)  Thought Content:  Logical and Hallucinations: None  Suicidal Thoughts:  Passive suicidal thoughts  Homicidal Thoughts:  No  Memory:  Immediate;   Good Recent;   Fair  Judgement:  Impaired  Insight:  Fair   Psychomotor Activity:  Normal  Concentration: Concentration: Fair and Attention Span: Fair  Recall:  Good  Fund of Knowledge:Good  Language: Good  Akathisia:  NA  Handed:  Right  AIMS (if indicated):     Assets:  Communication Skills Desire for Improvement Housing Leisure Time Physical Health  Sleep:       Musculoskeletal: Strength & Muscle Tone: within normal limits Gait & Station: normal    Recommendations:  Based on my evaluation the patient does not appear to have an emergency medical condition.  Jackelyn PolingJason A Annaston Upham, NP 10/30/2018, 12:26 AM

## 2018-10-30 NOTE — Tx Team (Signed)
Interdisciplinary Treatment and Diagnostic Plan Update  10/30/2018 Time of Session:  Jaime Mendoza MRN: [id number]  Principal Diagnosis: <principal problem not specified>  Secondary Diagnoses: Active Problems:   Severe recurrent major depression without psychotic features (HCC)   Current Medications:  Current Facility-Administered Medications  Medication Dose Route Frequency Provider Last Rate Last Dose  . acetaminophen (TYLENOL) tablet 650 mg  650 mg Oral Q6H PRN Rozetta Nunnery, NP      . alum & mag hydroxide-simeth (MAALOX/MYLANTA) 200-200-20 MG/5ML suspension 30 mL  30 mL Oral Q4H PRN Lindon Romp A, NP      . feeding supplement (ENSURE ENLIVE) (ENSURE ENLIVE) liquid 237 mL  237 mL Oral BID BM Cobos, Fernando A, MD      . hydrOXYzine (ATARAX/VISTARIL) tablet 25 mg  25 mg Oral TID PRN Lindon Romp A, NP   25 mg at 10/30/18 0105  . magnesium hydroxide (MILK OF MAGNESIA) suspension 30 mL  30 mL Oral Daily PRN Lindon Romp A, NP      . multivitamin with minerals tablet 1 tablet  1 tablet Oral Daily Cobos, Fernando A, MD      . nicotine (NICODERM CQ - dosed in mg/24 hours) patch 14 mg  14 mg Transdermal Daily Rozetta Nunnery, NP   Stopped at 10/30/18 [phone]  . traZODone (DESYREL) tablet 50 mg  50 mg Oral QHS PRN Lindon Romp A, NP   50 mg at 10/30/18 0105   PTA Medications: No medications prior to admission.    Patient Stressors: Financial difficulties Substance abuse  Patient Strengths: Network engineer for treatment/growth Supportive family/friends  Treatment Modalities: Medication Management, Group therapy, Case management,  1 to 1 session with clinician, Psychoeducation, Recreational therapy.   Physician Treatment Plan for Primary Diagnosis: <principal problem not specified> Long Term Goal(s):     Short Term Goals:    Medication Management: Evaluate patient's response, side effects, and tolerance of medication  regimen.  Therapeutic Interventions: 1 to 1 sessions, Unit Group sessions and Medication administration.  Evaluation of Outcomes: Not Met  Physician Treatment Plan for Secondary Diagnosis: Active Problems:   Severe recurrent major depression without psychotic features ([place])  Long Term Goal(s):     Short Term Goals:       Medication Management: Evaluate patient's response, side effects, and tolerance of medication regimen.  Therapeutic Interventions: 1 to 1 sessions, Unit Group sessions and Medication administration.  Evaluation of Outcomes: Not Met   RN Treatment Plan for Primary Diagnosis: <principal problem not specified> Long Term Goal(s): Knowledge of disease and therapeutic regimen to maintain health will improve  Short Term Goals: Ability to participate in decision making will improve, Ability to verbalize feelings will improve, Ability to disclose and discuss suicidal ideas and Ability to identify and develop effective coping behaviors will improve  Medication Management: RN will administer medications as ordered by provider, will assess and evaluate patient's response and provide education to patient for prescribed medication. RN will report any adverse and/or side effects to prescribing provider.  Therapeutic Interventions: 1 on 1 counseling sessions, Psychoeducation, Medication administration, Evaluate responses to treatment, Monitor vital signs and CBGs as ordered, Perform/monitor CIWA, COWS, AIMS and Fall Risk screenings as ordered, Perform wound care treatments as ordered.  Evaluation of Outcomes: Not Met   LCSW Treatment Plan for Primary Diagnosis: <principal problem not specified> Long Term Goal(s): Safe transition to appropriate next level of care at discharge, Engage patient in therapeutic group addressing interpersonal concerns.  Short Term Goals: Engage patient in aftercare planning with referrals and resources  Therapeutic Interventions: Assess for all  discharge needs, 1 to 1 time with Social worker, Explore available resources and support systems, Assess for adequacy in community support network, Educate family and significant other(s) on suicide prevention, Complete Psychosocial Assessment, Interpersonal group therapy.  Evaluation of Outcomes: Not Met   Progress in Treatment: Attending groups: No. Participating in groups: No. Taking medication as prescribed: Yes. Toleration medication: Yes. Family/Significant other contact made: No, will contact:  if patient consents to collateral contacts  Patient understands diagnosis: Yes. Discussing patient identified problems/goals with staff: Yes. Medical problems stabilized or resolved: Yes. Denies suicidal/homicidal ideation: Yes. Issues/concerns per patient self-inventory: No. Other:   New problem(s) identified: None   New Short Term/Long Term Goal(s): medication stabilization, elimination of SI thoughts, development of comprehensive mental wellness plan.    Patient Goals:  Learning how to cope with my depression and anger  Discharge Plan or Barriers: CSW will assess for appropriate referrals and possible discharge planning.   Reason for Continuation of Hospitalization: Anxiety Depression Medication stabilization Suicidal ideation  Estimated Length of Stay: 3-5 days   Attendees: Patient: 10/30/2018 11:05 AM  Physician: Dr. Neita Garnet, MD 10/30/2018 11:05 AM  Nursing: Chrys Racer.Jacinto Reap, RN 10/30/2018 11:05 AM  RN Care Manager: 10/30/2018 11:05 AM  Social Worker: Radonna Ricker, [place] 10/30/2018 11:05 AM  Recreational Therapist:  10/30/2018 11:05 AM  Other:  10/30/2018 11:05 AM  Other:  10/30/2018 11:05 AM  Other: 10/30/2018 11:05 AM    Scribe for Treatment Team: Marylee Floras, [place] 10/30/2018 11:05 AM

## 2018-10-30 NOTE — Tx Team (Signed)
Interdisciplinary Treatment and Diagnostic Plan Update  10/30/2018 Time of Session:  Jaime Mendoza MRN: [id number]  Principal Diagnosis: MDD (major depressive disorder), single episode  Secondary Diagnoses: Principal Problem:   MDD (major depressive disorder), single episode   Current Medications:  No current facility-administered medications for this encounter.    No current outpatient medications on file.   Facility-Administered Medications Ordered in Other Encounters  Medication Dose Route Frequency Provider Last Rate Last Dose  . acetaminophen (TYLENOL) tablet 650 mg  650 mg Oral Q6H PRN Rozetta Nunnery, NP      . alum & mag hydroxide-simeth (MAALOX/MYLANTA) 200-200-20 MG/5ML suspension 30 mL  30 mL Oral Q4H PRN Lindon Romp A, NP      . feeding supplement (ENSURE ENLIVE) (ENSURE ENLIVE) liquid 237 mL  237 mL Oral BID BM Cobos, Fernando A, MD      . hydrOXYzine (ATARAX/VISTARIL) tablet 25 mg  25 mg Oral TID PRN Lindon Romp A, NP   25 mg at 10/30/18 0105  . magnesium hydroxide (MILK OF MAGNESIA) suspension 30 mL  30 mL Oral Daily PRN Lindon Romp A, NP      . nicotine (NICODERM CQ - dosed in mg/24 hours) patch 14 mg  14 mg Transdermal Daily Lindon Romp A, NP      . traZODone (DESYREL) tablet 50 mg  50 mg Oral QHS PRN Lindon Romp A, NP   50 mg at 10/30/18 0105   PTA Medications: Medications Prior to Admission  Medication Sig Dispense Refill Last Dose  . cephALEXin (KEFLEX) 500 MG capsule Take 1 capsule (500 mg total) by mouth 4 (four) times daily. 28 capsule 0   . coconut oil OIL Apply 1 application topically as needed. 500 mL PRN Not Taking at Unknown time  . ferrous sulfate 325 (65 FE) MG tablet Take 1 tablet (325 mg total) by mouth 3 (three) times daily with meals. 120 tablet 3 Not Taking at Unknown time  . ibuprofen (ADVIL,MOTRIN) 600 MG tablet Take 1 tablet (600 mg total) by mouth every 6 (six) hours. 120 tablet 2   . methocarbamol (ROBAXIN) 500 MG tablet Take 2 tablets  (1,000 mg total) by mouth every 8 (eight) hours as needed. 30 tablet 0   . metroNIDAZOLE (FLAGYL) 500 MG tablet Take 1 tablet (500 mg total) by mouth 2 (two) times daily. 14 tablet 0 Not Taking at Unknown time  . ondansetron (ZOFRAN ODT) 8 MG disintegrating tablet Take 1 tablet (8 mg total) by mouth every 8 (eight) hours as needed for nausea or vomiting. 20 tablet 0   . oxyCODONE-acetaminophen (PERCOCET/ROXICET) 5-325 MG tablet Take 1 tablet by mouth every 6 (six) hours as needed for severe pain. 6 tablet 0   . senna-docusate (SENOKOT-S) 8.6-50 MG tablet Take 2 tablets by mouth 2 (two) times daily. 90 tablet 2 Not Taking at Unknown time  . sulfamethoxazole-trimethoprim (BACTRIM DS,SEPTRA DS) 800-160 MG tablet Take 1 tablet by mouth 2 (two) times daily. 6 tablet 0     Patient Stressors:    Patient Strengths:    Treatment Modalities: Medication Management, Group therapy, Case management,  1 to 1 session with clinician, Psychoeducation, Recreational therapy.   Physician Treatment Plan for Primary Diagnosis: MDD (major depressive disorder), single episode Long Term Goal(s):     Short Term Goals:    Medication Management: Evaluate patient's response, side effects, and tolerance of medication regimen.  Therapeutic Interventions: 1 to 1 sessions, Unit Group sessions and Medication administration.  Evaluation of  Outcomes: Not Met  Physician Treatment Plan for Secondary Diagnosis: Principal Problem:   MDD (major depressive disorder), single episode  Long Term Goal(s):     Short Term Goals:       Medication Management: Evaluate patient's response, side effects, and tolerance of medication regimen.  Therapeutic Interventions: 1 to 1 sessions, Unit Group sessions and Medication administration.  Evaluation of Outcomes: Not Met   RN Treatment Plan for Primary Diagnosis: MDD (major depressive disorder), single episode Long Term Goal(s): Knowledge of disease and therapeutic regimen to  maintain health will improve  Short Term Goals: Ability to participate in decision making will improve, Ability to verbalize feelings will improve, Ability to disclose and discuss suicidal ideas and Ability to identify and develop effective coping behaviors will improve  Medication Management: RN will administer medications as ordered by provider, will assess and evaluate patient's response and provide education to patient for prescribed medication. RN will report any adverse and/or side effects to prescribing provider.  Therapeutic Interventions: 1 on 1 counseling sessions, Psychoeducation, Medication administration, Evaluate responses to treatment, Monitor vital signs and CBGs as ordered, Perform/monitor CIWA, COWS, AIMS and Fall Risk screenings as ordered, Perform wound care treatments as ordered.  Evaluation of Outcomes: Not Met   LCSW Treatment Plan for Primary Diagnosis: MDD (major depressive disorder), single episode Long Term Goal(s): Safe transition to appropriate next level of care at discharge, Engage patient in therapeutic group addressing interpersonal concerns.  Short Term Goals: Engage patient in aftercare planning with referrals and resources  Therapeutic Interventions: Assess for all discharge needs, 1 to 1 time with Social worker, Explore available resources and support systems, Assess for adequacy in community support network, Educate family and significant other(s) on suicide prevention, Complete Psychosocial Assessment, Interpersonal group therapy.  Evaluation of Outcomes: Not Met   Progress in Treatment: Attending groups: No. Participating in groups: No. Taking medication as prescribed: Yes. Toleration medication: Yes. Family/Significant other contact made: No, will contact:  if pateint consents to collateral contacts  Patient understands diagnosis: Yes. Discussing patient identified problems/goals with staff: Yes. Medical problems stabilized or resolved:  Yes. Denies suicidal/homicidal ideation: Yes. Issues/concerns per patient self-inventory: No. Other:   New problem(s) identified: None   New Short Term/Long Term Goal(s):medication stabilization, elimination of SI thoughts, development of comprehensive mental wellness plan.    Patient Goals:  ???  Discharge Plan or Barriers: CSW will assess for appropriate referrals and possible discharge planning.   Reason for Continuation of Hospitalization: Anxiety Depression Medication stabilization Suicidal ideation  Estimated Length of Stay: 3-5 days   Attendees: Patient: 10/30/2018 8:10 AM  Physician: Dr. Neita Garnet, MD 10/30/2018 8:10 AM  Nursing:  10/30/2018 8:10 AM  RN Care Manager: 10/30/2018 8:10 AM  Social Worker: Radonna Ricker, [place] 10/30/2018 8:10 AM  Recreational Therapist:  10/30/2018 8:10 AM  Other:  10/30/2018 8:10 AM  Other:  10/30/2018 8:10 AM  Other: 10/30/2018 8:10 AM    Scribe for Treatment Team: Marylee Floras, [place] 10/30/2018 8:10 AM

## 2018-10-30 NOTE — Progress Notes (Signed)
NUTRITION ASSESSMENT  Pt identified as at risk on the Malnutrition Screen Tool  INTERVENTION: - Continue Ensure Enlive BID, each supplement provides 350 kcal and 20 grams of protein. - Will order daily multivitamin with minerals. - Continue to encourage PO intakes.   NUTRITION DIAGNOSIS: Unintentional weight loss related to sub-optimal intake as evidenced by pt report.   Goal: Pt to meet >/= 90% of their estimated nutrition needs.  Monitor:  PO intake  Assessment:  Patient admitted for ongoing depression, anger issues, and SI without a plan. Limited weight hx available, but indicates that current weight is consistent with weight in August 2019.    27 y.o. female  Height: Ht Readings from Last 1 Encounters:  10/30/18 5\' 7"  (1.702 m)    Weight: Wt Readings from Last 1 Encounters:  10/30/18 51.3 kg    Weight Hx: Wt Readings from Last 10 Encounters:  10/30/18 51.3 kg  08/28/18 49 kg  07/08/17 51.3 kg  07/04/17 52.2 kg  09/24/16 62.1 kg  09/23/16 62.1 kg  09/18/16 61.9 kg  09/12/16 60.3 kg  09/11/16 61.1 kg  09/06/16 60.8 kg    BMI:  Body mass index is 17.7 kg/m. Pt meets criteria for underweight based on current BMI.  Estimated Nutritional Needs: Kcal: 25-30 kcal/kg Protein: > 1 gram protein/kg Fluid: 1 ml/kcal  Diet Order:  Diet Order            Diet regular Room service appropriate? Yes; Fluid consistency: Thin  Diet effective now             Pt is also offered choice of unit snacks mid-morning and mid-afternoon.  Pt is eating as desired.   Lab results and medications reviewed.     Trenton GammonJessica Tyanna Hach, MS, RD, LDN, [facility] Inpatient Clinical Dietitian Pager # [phone] After hours/weekend pager # [phone]

## 2018-10-30 NOTE — [facility] Group Notes (Signed)
The focus of this group is to help patients establish daily goals to achieve during treatment and discuss how the patient can incorporate goal setting into their daily lives to aide in recovery.   PT. Did not attend  

## 2018-10-30 NOTE — Progress Notes (Signed)
Recreation Therapy Notes  Date: 11.27.19 Time: 930 Location: [address] Dayroom  Group Topic: Stress Management  Goal Area(s) Addresses:  Patient will verbalize importance of using healthy stress management.  Patient will identify positive emotions associated with healthy stress management.   Intervention: Stress Management  Activity :  Meditation.  LRT introduced the stress management technique of meditation.  LRT played a meditation that guided patients to focus on the present and let go of things they can't change.  Patients were to follow along as the meditation played.  Education:  Stress Management, Discharge Planning.   Education Outcome: Acknowledges edcuation/In group clarification offered/Needs additional education  Clinical Observations/Feedback: Pt did not attend group.     Kaycie Pegues, LRT/CTRS         Jaime Mendoza A 10/30/2018 11:03 AM 

## 2018-10-30 NOTE — Plan of Care (Signed)
  Problem: Education: Goal: Mental status will improve Outcome: Not Progressing   Problem: Activity: Goal: Sleeping patterns will improve Outcome: Not Progressing   Problem: Health Behavior/Discharge Planning: Goal: Compliance with treatment plan for underlying cause of condition will improve Outcome: Not Progressing   Problem: Activity: Goal: Imbalance in normal sleep/wake cycle will improve Outcome: Not Progressing  D: Patient is currently sleeping.  She has been asleep since her admission.  Will reassess when she wakes.

## 2018-10-30 NOTE — Progress Notes (Signed)
Jaime Mendoza is a 27 year old female being admitted voluntarily to [facility].  She came in with ongoing depression, anger issues and suicidal ideation without plan.  She has history of a psychiatric admission in 2015.  [facility] she was pleasant and cooperative.  Speech slow and soft.  She denied SI/HI or A/V hallucinations.  She did state that she would seek out staff if those thoughts worsen.  She denied any pain or discomfort and appeared to be in no physical distress.  Stressor include upcoming court date and poor sleep.  Oriented her to the unit.  Admission paperwork completed and signed.  Belongings searched and secured in locker # 32, no contraband found.  Skin assessment completed and no skin issues noted  Q 15 minute checks initiated for safety.  We will continue to monitor the progress towards her goals.

## 2018-10-30 NOTE — [facility] Counselor (Signed)
CSW attempted to complete PSA with the patient, however the patient was asleep and did not respond to her name being called numerous times. CSW will attempt PSA at a later time.   Baldo DaubJolan Landry Lookingbill, MSW, LCSWA Clinical Social Worker [facility]  Phone: [phone]
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
## 2018-10-30 NOTE — Tx Team (Signed)
Initial Treatment Plan 10/30/2018 12:59 AM Aaralynn D Clelia CroftShaw OZD:[id number]:[id number]    PATIENT STRESSORS: Financial difficulties Substance abuse   PATIENT STRENGTHS: DentistCommunication skills General fund of knowledge Motivation for treatment/growth Supportive family/friends   PATIENT IDENTIFIED PROBLEMS: Depression  Suicidal ideation  "Learning how to cope with my depression and anger."                 DISCHARGE CRITERIA:  Improved stabilization in mood, thinking, and/or behavior Need for constant or close observation no longer present Reduction of life-threatening or endangering symptoms to within safe limits  PRELIMINARY DISCHARGE PLAN: Outpatient therapy Medication management  PATIENT/FAMILY INVOLVEMENT: This treatment plan has been presented to and reviewed with the patient, Jaime Mendoza.  The patient and family have been given the opportunity to ask questions and make suggestions.  Levin BaconHeather V Arrietty Dercole, RN 10/30/2018, 12:59 AM

## 2018-10-30 NOTE — [facility] Group Notes (Signed)
[facility] Mental Health Association Group Therapy      10/30/2018 11:15 AM  Type of Therapy: Mental Health Association Presentation  Participation Level: Did Not Attend   Summary of Progress/Problems:Invited, chose not to attend.   Alcario DroughtJolan Dustie Brittle LCSWA Clinical Social Worker

## 2018-10-30 NOTE — [facility] Suicide Risk Assessment (Addendum)
[facility]   Nursing information obtained from:  Patient Demographic factors:  Unemployed Current Mental Status:  NA Loss Factors:  Loss of significant relationship Historical Factors:  Anniversary of important loss Risk Reduction Factors:  Responsible for children under 28 years of age  Total Time spent with patient: 45 minutes Principal Problem:  MDD, Intermittent Explosive Disorder, Cannabis Use Disorder, Cocaine Use Disorder, Substance Induced Mood Disorder  Diagnosis:  Active Problems:   Severe recurrent major depression without psychotic features (HCC)  Subjective Data:   Continued Clinical Symptoms:  Alcohol Use Disorder Identification Test Final Score (AUDIT): 14 The "Alcohol Use Disorders Identification Test", Guidelines for Use in Primary Care, Second Edition.  World Science writerHealth Organization [facility](WHO). Score between 0-7:  no or low risk or alcohol related problems. Score between 8-15:  moderate risk of alcohol related problems. Score between 16-19:  high risk of alcohol related problems. Score 20 or above:  warrants further diagnostic evaluation for alcohol dependence and treatment.   CLINICAL FACTORS:  27 year old female, presents for worsening depression.  Endorses prominent neurovegetative symptoms to include significant weight loss.  Currently denies suicidal ideations.  In addition to depression, which she characterizes as chronic, describes history of brief explosive/angry outbursts which have resulted and lost relationships and jobs.  Uses cannabis daily, cocaine regularly.     Psychiatric Specialty Exam: Physical Exam  ROS  Blood pressure 108/73, pulse 94, temperature 98.2 F (36.8 C), temperature source Oral, resp. rate 18, height 5\' 7"  (1.702 m), weight 51.3 kg, unknown if currently breastfeeding.Body mass index is 17.7 kg/m.  See admit note MSE   COGNITIVE FEATURES THAT CONTRIBUTE TO RISK:  Closed-mindedness and Loss of executive  function    SUICIDE RISK:   Moderate:  Frequent suicidal ideation with limited intensity, and duration, some specificity in terms of plans, no associated intent, good self-control, limited dysphoria/symptomatology, some risk factors present, and identifiable protective factors, including available and accessible social support.  PLAN OF CARE: Patient will be admitted to inpatient psychiatric unit for stabilization and safety. Will provide and encourage milieu participation. Provide medication management and maked adjustments as needed.  Will follow daily.    I certify that inpatient services furnished can reasonably be expected to improve the patient's condition.   Craige CottaFernando A Mettie Roylance, MD 10/30/2018, 11:43 AM

## 2018-10-30 NOTE — Progress Notes (Signed)
Patient ID: Al CorpusAngelica D Ayotte, female   DOB: August 13, 1991, 27 y.o.   MRN: [id number]  D: Patient lying in bed after shift change. Woke her up tonight to ask her about medication. She reported that she did not want it yet. Minimally answering questions. Will assess when she is more awake. A: Staff will continue to monitor on q 15 minute checks, follow treatment plan, and give medications as ordered. R: Didn't attend
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
## 2018-10-30 NOTE — [facility] Suicide Risk Assessment (Signed)
[facility] INPATIENT:  Family/Significant Other Suicide Prevention Education  Suicide Prevention Education:  Patient Refusal for Family/Significant Other Suicide Prevention Education: The patient Jaime Mendoza has refused to provide written consent for family/significant other to be provided Family/Significant Other Suicide Prevention Education during admission and/or prior to discharge.  Physician notified.  SPE completed with patient, as patient refused to consent to family contact. Patient was encouraged to share information with support network, ask questions, and talk about any concerns relating to SPE. Patient denies access to guns/firearms and verbalized understanding of information provided. Mobile Crisis information also provided to patient.    Maeola SarahJolan E Anneke Cundy 10/30/2018, 12:21 PM

## 2018-10-31 MED ORDER — SERTRALINE HCL 50 MG PO TABS
50.0000 mg | ORAL_TABLET | Freq: Every day | ORAL | Status: DC
  Administered 2018-11-01: 50 mg via ORAL
  Filled 2018-10-31: qty 1
  Filled 2018-10-31: qty 7
  Filled 2018-10-31: qty 1
  Filled 2018-10-31: qty 7

## 2018-10-31 NOTE — Progress Notes (Signed)
Patient signed 72 hour discharged request 10/30/18 @ 1600.

## 2018-10-31 NOTE — Plan of Care (Signed)
Nurse discussed anxiety, depression and coping skills with patient.  

## 2018-10-31 NOTE — Plan of Care (Signed)
  Problem: Coping: Goal: Ability to demonstrate self-control will improve Outcome: Progressing Note:  Pt has maintained control of her behavior tonight.    

## 2018-10-31 NOTE — Progress Notes (Signed)
D:  Patient denied SI and HI, contracts for safety.  Denied A/V hallucinations.   A:  Medications administered per MD orders.  Emotional support and encouragement given patient. R:  Safety maintained with 15 minute checks.  

## 2018-10-31 NOTE — Progress Notes (Signed)
[facility] MD Progress Note  10/31/2018 8:54 AM Jaime Mendoza  MRN:  [id number]   Subjective: Patient reports today that she is feeling better.  She denies any suicidal or homicidal ideations and denies any hallucinations patient states that she feels her aggression is improving as well.  She reports that her depression and anger have been the reason that she has lost her previous jobs.  She also reports her multiple stressors of low income and having 5 children at ages 31, 64, 36, 89, and 2.  She reports that she slept very well last night.  She does report still having some depression though and is curious about her medication being increased to assist with that.  She also reports that her cocaine and marijuana use are approximately 1-2 times a week, but states that she feels that the drugs are not the reason for her symptoms as the symptoms were there before she started using drugs.  Objective: Patient's chart and findings reviewed and discussed with treatment team.  Patient is pleasant, calm, cooperative.  Patient is in the day room interacting with peers and staff and is appropriate.  She reports that she has been reporting to groups and participating.  Principal Problem: Severe recurrent major depression without psychotic features (HCC) Diagnosis: Principal Problem:   Severe recurrent major depression without psychotic features (HCC)  Total Time spent with patient: 30 minutes  Past Psychiatric History: See H&P  Past Medical History:  Past Medical History:  Diagnosis Date  . Anxiety   . Asthma   . Chlamydia   . Depression   . HSV-2 infection complicating pregnancy   . Preterm labor   . SVD (spontaneous vaginal delivery) 01/30/2012    Past Surgical History:  Procedure Laterality Date  . CESAREAN SECTION N/A 09/24/2016   Procedure: CESAREAN SECTION;  Surgeon: Tereso Newcomer, MD;  Location: [facility];  Service: Obstetrics;  Laterality: N/A;  . NO PAST SURGERIES     Family  History:  Family History  Problem Relation Age of Onset  . Cancer Father        colon  . Seizures Sister   . Diabetes Maternal Grandmother   . Heart disease Maternal Grandfather   . Heart disease Paternal Grandfather   . Anesthesia problems Neg Hx   . Hypotension Neg Hx   . Malignant hyperthermia Neg Hx   . Pseudochol deficiency Neg Hx    Family Psychiatric  History: See H&P Social History:  Social History   Substance and Sexual Activity  Alcohol Use Yes   Comment: socially     Social History   Substance and Sexual Activity  Drug Use Yes  . Types: Cocaine, Marijuana   Comment: Positive for Cocaine, THC, Amphetaines     Social History   Socioeconomic History  . Marital status: Single    Spouse name: Not on file  . Number of children: Not on file  . Years of education: Not on file  . Highest education level: Not on file  Occupational History  . Not on file  Social Needs  . Financial resource strain: Not on file  . Food insecurity:    Worry: Not on file    Inability: Not on file  . Transportation needs:    Medical: Not on file    Non-medical: Not on file  Tobacco Use  . Smoking status: Current Every Day Smoker    Packs/day: 0.25    Years: 5.00    Pack years: 1.25  Types: Cigarettes  . Smokeless tobacco: Never Used  Substance and Sexual Activity  . Alcohol use: Yes    Comment: socially  . Drug use: Yes    Types: Cocaine, Marijuana    Comment: Positive for Cocaine, THC, Amphetaines   . Sexual activity: Yes    Birth control/protection: None    Comment: depo-provera  Lifestyle  . Physical activity:    Days per week: Not on file    Minutes per session: Not on file  . Stress: Not on file  Relationships  . Social connections:    Talks on phone: Not on file    Gets together: Not on file    Attends religious service: Not on file    Active member of club or organization: Not on file    Attends meetings of clubs or organizations: Not on file     Relationship status: Not on file  Other Topics Concern  . Not on file  Social History Narrative  . Not on file   Additional Social History:                         Sleep: Good  Appetite:  Good  Current Medications: Current Facility-Administered Medications  Medication Dose Route Frequency Provider Last Rate Last Dose  . acetaminophen (TYLENOL) tablet 650 mg  650 mg Oral Q6H PRN Nira ConnBerry, Jason A, NP      . alum & mag hydroxide-simeth (MAALOX/MYLANTA) 200-200-20 MG/5ML suspension 30 mL  30 mL Oral Q4H PRN Nira ConnBerry, Jason A, NP      . feeding supplement (ENSURE ENLIVE) (ENSURE ENLIVE) liquid 237 mL  237 mL Oral BID BM Cobos, Rockey SituFernando A, MD   237 mL at 10/30/18 1441  . LORazepam (ATIVAN) tablet 0.5 mg  0.5 mg Oral Q6H PRN Cobos, Fernando A, MD      . magnesium hydroxide (MILK OF MAGNESIA) suspension 30 mL  30 mL Oral Daily PRN Nira ConnBerry, Jason A, NP      . multivitamin with minerals tablet 1 tablet  1 tablet Oral Daily Cobos, Rockey SituFernando A, MD   1 tablet at 10/31/18 0742  . nicotine (NICODERM CQ - dosed in mg/24 hours) patch 14 mg  14 mg Transdermal Daily Nira ConnBerry, Jason A, NP   14 mg at 10/31/18 0743  . OLANZapine (ZYPREXA) tablet 5 mg  5 mg Oral QHS Cobos, Rockey SituFernando A, MD   5 mg at 10/30/18 2222  . [START ON 11/01/2018] sertraline (ZOLOFT) tablet 50 mg  50 mg Oral Daily Zakayla Martinec B, FNP      . traZODone (DESYREL) tablet 50 mg  50 mg Oral QHS PRN Jackelyn PolingBerry, Jason A, NP   50 mg at 10/30/18 0105    Lab Results:  Results for orders placed or performed during the hospital encounter of 10/30/18 (from the past 48 hour(s))  Urine rapid drug screen (hosp performed)not at [facility]     Status: Abnormal   Collection Time: 10/30/18  6:40 AM  Result Value Ref Range   Opiates NONE DETECTED NONE DETECTED   Cocaine POSITIVE (A) NONE DETECTED   Benzodiazepines NONE DETECTED NONE DETECTED   Amphetamines NONE DETECTED NONE DETECTED   Tetrahydrocannabinol POSITIVE (A) NONE DETECTED   Barbiturates NONE DETECTED  NONE DETECTED    Comment: (NOTE) DRUG SCREEN FOR MEDICAL PURPOSES ONLY.  IF CONFIRMATION IS NEEDED FOR ANY PURPOSE, NOTIFY LAB WITHIN 5 DAYS. LOWEST DETECTABLE LIMITS FOR URINE DRUG SCREEN Drug Class  Cutoff (ng/mL) Amphetamine and metabolites    1000 Barbiturate and metabolites    200 Benzodiazepine                 200 Tricyclics and metabolites     300 Opiates and metabolites        300 Cocaine and metabolites        300 THC                            50 Performed at [facility], [address], [place], [place] [address]   Pregnancy, urine     Status: None   Collection Time: 10/30/18  6:40 AM  Result Value Ref Range   Preg Test, Ur NEGATIVE NEGATIVE    Comment:        THE SENSITIVITY OF THIS METHODOLOGY IS >20 mIU/mL. Performed at [facility], [address], [place], [place] [address]   CBC     Status: Abnormal   Collection Time: 10/30/18  6:44 AM  Result Value Ref Range   WBC 5.9 4.0 - 10.5 K/uL   RBC 4.33 3.87 - 5.11 MIL/uL   Hemoglobin 11.5 (L) 12.0 - 15.0 g/dL   HCT 09.8 11.9 - 14.7 %   MCV 88.5 80.0 - 100.0 fL   MCH 26.6 26.0 - 34.0 pg   MCHC 30.0 30.0 - 36.0 g/dL   RDW 82.9 (H) 56.2 - 13.0 %   Platelets 185 150 - 400 K/uL   nRBC 0.0 0.0 - 0.2 %    Comment: Performed at [facility], [address], [place], [place] [address]  Comprehensive metabolic panel     Status: None   Collection Time: 10/30/18  6:44 AM  Result Value Ref Range   Sodium 140 135 - 145 mmol/L   Potassium 4.0 3.5 - 5.1 mmol/L   Chloride 107 98 - 111 mmol/L   CO2 25 22 - 32 mmol/L   Glucose, Bld 92 70 - 99 mg/dL   BUN 15 6 - 20 mg/dL   Creatinine, Ser 4.69 0.44 - 1.00 mg/dL   Calcium 9.3 8.9 - 62.9 mg/dL   Total Protein 7.2 6.5 - 8.1 g/dL   Albumin 4.1 3.5 - 5.0 g/dL   AST 18 15 - 41 U/L   ALT 13 0 - 44 U/L   Alkaline Phosphatase 45 38 - 126 U/L   Total Bilirubin 0.8 0.3 - 1.2 mg/dL   GFR calc non Af Amer  >60 >60 mL/min   GFR calc Af Amer >60 >60 mL/min   Anion gap 8 5 - 15    Comment: Performed at [facility], [address], [place], [place] [address]  Lipid panel     Status: None   Collection Time: 10/30/18  6:44 AM  Result Value Ref Range   Cholesterol 163 0 - 200 mg/dL   Triglycerides 88 <324 mg/dL   HDL 46 >40 mg/dL   Total CHOL/HDL Ratio 3.5 RATIO   VLDL 18 0 - 40 mg/dL   LDL Cholesterol 99 0 - 99 mg/dL    Comment:        Total Cholesterol/HDL:CHD Risk Coronary Heart Disease Risk Table                     Men   Women  1/2 Average Risk   3.4   3.3  Average Risk  5.0   4.4  2 X Average Risk   9.6   7.1  3 X Average Risk  23.4   11.0        Use the calculated Patient Ratio above and the CHD Risk Table to determine the patient's CHD Risk.        ATP III CLASSIFICATION (LDL):  <100     mg/dL   Optimal  161-096  mg/dL   Near or Above                    Optimal  130-159  mg/dL   Borderline  045-409  mg/dL   High  >811     mg/dL   Very High Performed at [facility], [address], [place], [place] [address]   TSH     Status: None   Collection Time: 10/30/18  6:44 AM  Result Value Ref Range   TSH 1.926 0.350 - 4.500 uIU/mL    Comment: Performed by a 3rd Generation assay with a functional sensitivity of <=0.01 uIU/mL. Performed at [facility], [address] [place], [place] [address]   Hemoglobin A1c     Status: None   Collection Time: 10/30/18  6:46 AM  Result Value Ref Range   Hgb A1c MFr Bld 5.2 4.8 - 5.6 %    Comment: (NOTE) Pre diabetes:          5.7%-6.4% Diabetes:              >6.4% Glycemic control for   <7.0% adults with diabetes    Mean Plasma Glucose 102.54 mg/dL    Comment: Performed at [facility], [address] [place], [place] [address]    Blood Alcohol level:  Lab Results  Component Value Date   ETH 174 (H) 03/26/2018   ETH 67 (H) 11/04/2014    Metabolic Disorder  Labs: Lab Results  Component Value Date   HGBA1C 5.2 10/30/2018   MPG 102.54 10/30/2018   No results found for: PROLACTIN Lab Results  Component Value Date   CHOL 163 10/30/2018   TRIG 88 10/30/2018   HDL 46 10/30/2018   CHOLHDL 3.5 10/30/2018   VLDL 18 10/30/2018   LDLCALC 99 10/30/2018    Physical Findings: AIMS: Facial and Oral Movements Muscles of Facial Expression: None, normal Lips and Perioral Area: None, normal Jaw: None, normal Tongue: None, normal,Extremity Movements Upper (arms, wrists, hands, fingers): None, normal Lower (legs, knees, ankles, toes): None, normal, Trunk Movements Neck, shoulders, hips: None, normal, Overall Severity Severity of abnormal movements (highest score from questions above): None, normal Incapacitation due to abnormal movements: None, normal Patient's awareness of abnormal movements (rate only patient's report): No Awareness, Dental Status Current problems with teeth and/or dentures?: No Does patient usually wear dentures?: No  CIWA:    COWS:     Musculoskeletal: Strength & Muscle Tone: within normal limits Gait & Station: normal Patient leans: N/A  Psychiatric Specialty Exam: Physical Exam  Nursing note and vitals reviewed. Constitutional: She is oriented to person, place, and time. She appears well-developed and well-nourished.  Cardiovascular: Normal rate.  Respiratory: Effort normal.  Musculoskeletal: Normal range of motion.  Neurological: She is alert and oriented to person, place, and time.  Skin: Skin is warm.    Review of Systems  Constitutional: Negative.   HENT: Negative.   Eyes: Negative.   Respiratory: Negative.   Cardiovascular: Negative.   Gastrointestinal: Negative.   Genitourinary: Negative.  Musculoskeletal: Negative.   Skin: Negative.   Neurological: Negative.   Endo/Heme/Allergies: Negative.   Psychiatric/Behavioral: Positive for depression and substance abuse. Negative for hallucinations and  suicidal ideas. The patient is nervous/anxious.     Blood pressure (!) 130/98, pulse 77, temperature 98.1 F (36.7 C), resp. rate 18, height 5\' 7"  (1.702 m), weight 51.3 kg, unknown if currently breastfeeding.Body mass index is 17.7 kg/m.  General Appearance: Casual  Eye Contact:  Good  Speech:  Clear and Coherent and Normal Rate  Volume:  Normal  Mood:  Depressed  Affect:  Congruent  Thought Process:  Coherent and Descriptions of Associations: Intact  Orientation:  Full (Time, Place, and Person)  Thought Content:  WDL  Suicidal Thoughts:  No  Homicidal Thoughts:  No  Memory:  Immediate;   Good Recent;   Good Remote;   Good  Judgement:  Fair  Insight:  Fair  Psychomotor Activity:  Normal  Concentration:  Concentration: Good and Attention Span: Good  Recall:  Good  Fund of Knowledge:  Good  Language:  Good  Akathisia:  No  Handed:  Right  AIMS (if indicated):     Assets:  Communication Skills Desire for Improvement Financial Resources/Insurance Housing Physical Health Social Support Transportation  ADL's:  Intact  Cognition:  WNL  Sleep:  Number of Hours: 6.75   Problems addressed Major depressive disorder severe recurrent  Treatment Plan Summary: Daily contact with patient to assess and evaluate symptoms and progress in treatment, Medication management and Plan is to: Continue Zyprexa 5 mg p.o. nightly for mood stability Increase Zoloft 50 mg p.o. daily for mood stability Continue trazodone 50 mg p.o. nightly as needed for insomnia Continue Ativan 0.5 mg p.o. every 6 hours as needed for anxiety Encourage group therapy participation  Maryfrances Bunnell, FNP 10/31/2018, 8:54 AM

## 2018-10-31 NOTE — Progress Notes (Signed)
D: Pt was in bed in her room upon initial approach.  Pt presents with depressed affect and mood.  She brightens with interaction and smiles on occasion while talking with Clinical research associatewriter.  She reports her day was "fine" and "I've been in the dayroom the majority of the day."  Her goal is to "catch up on a little bit more rest and I hope to go home soon."  Pt denies SI/HI, denies hallucinations, denies pain.  Pt has been visible in milieu interacting with peers and staff appropriately.  Pt attended evening group.    A: Introduced self to pt.  Actively listened to pt and offered support and encouragement. Medication administered per order.  Medication education provided.  PRN medication administered for sleep.  Fall prevention techniques reviewed with pt and she verbalized understanding.  Q15 minute safety checks maintained.  R: Pt is safe on the unit.  Pt is compliant with medications.  Pt verbally contracts for safety.  Will continue to monitor and assess.

## 2018-10-31 NOTE — [facility] Group Notes (Signed)
Adult Psychoeducational Group Note  Date:  10/31/2018 Time:  9:52 PM  Group Topic/Focus:  Wrap-Up Group:   The focus of this group is to help patients review their daily goal of treatment and discuss progress on daily workbooks.  Participation Level:  Active  Participation Quality:  Appropriate and Attentive  Affect:  Appropriate  Cognitive:  Alert and Appropriate  Insight: Appropriate and Good  Engagement in Group:  Engaged  Modes of Intervention:  Discussion and Education  Additional Comments:  Pt attended and participated in wrap up group this evening. Pt rated their day an 8/10, due to them feeling better than they did when they first came. Pt Pt somewhat completed their goal, which was to keep their attitude under control and to have less depressive thoughts.   Chrisandra NettersOctavia A Alec Mcphee 10/31/2018, 9:52 PM

## 2018-11-01 MED ORDER — OLANZAPINE 5 MG PO TABS: 5.0000 mg | ORAL_TABLET | Freq: Every day | ORAL | 0 refills | Status: DC

## 2018-11-01 MED ORDER — TRAZODONE HCL 50 MG PO TABS: 50.0000 mg | ORAL_TABLET | Freq: Every evening | ORAL | 0 refills | Status: DC | PRN

## 2018-11-01 MED ORDER — SERTRALINE HCL 50 MG PO TABS: 50.0000 mg | ORAL_TABLET | Freq: Every day | ORAL | 0 refills | Status: DC

## 2018-11-01 NOTE — Discharge Summary (Addendum)
Physician Discharge Summary Note  Patient:  Jaime Mendoza is an 27 y.o., female MRN:  [id number] DOB:  1991/09/16 Patient phone:  [phone] (home)  Patient address:   [address] [place] [id number],  Total Time spent with patient: 20 minutes  Date of Admission:  10/30/2018 Date of Discharge: 11/01/18  Reason for Admission:  Worsening depression with SI  Principal Problem: Severe recurrent major depression without psychotic features [facility](HCC) Discharge Diagnoses: Principal Problem:   Severe recurrent major depression without psychotic features [facility](HCC)   Past Psychiatric History: one prior psychiatric admission at age 27 for depression and anger /HI towards an aunt . Denies history of suicide attempts, no history of self cutting, denies history of psychosis. She reports history of chronic depression, which waxes and wanes . States " I feel I have been depressed this whole year". Reports depression worsened after death of brother last year.  Denies clear history of mania or hypomania, but describes brief episodes of explosive anger/irritable outbursts, which she describes as brief episodes lasting several minutesof feeling very angry, out of control when triggered .States this has caused her to lose relationships and jobs.  Past Medical History:  Past Medical History:  Diagnosis Date  . Anxiety   . Asthma   . Chlamydia   . Depression   . HSV-2 infection complicating pregnancy   . Preterm labor   . SVD (spontaneous vaginal delivery) 01/30/2012    Past Surgical History:  Procedure Laterality Date  . CESAREAN SECTION N/A 09/24/2016   Procedure: CESAREAN SECTION;  Surgeon: Tereso NewcomerUgonna A Anyanwu, MD;  Location: [facility];  Service: Obstetrics;  Laterality: N/A;  . NO PAST SURGERIES     Family History:  Family History  Problem Relation Age of Onset  . Cancer Father        colon  . Seizures Sister   . Diabetes Maternal Grandmother   . Heart disease Maternal  Grandfather   . Heart disease Paternal Grandfather   . Anesthesia problems Neg Hx   . Hypotension Neg Hx   . Malignant hyperthermia Neg Hx   . Pseudochol deficiency Neg Hx    Family Psychiatric  History: denies history of mental illness or of suicides in family, denies history of alcohol use disorder in the family Social History:  Social History   Substance and Sexual Activity  Alcohol Use Yes   Comment: socially     Social History   Substance and Sexual Activity  Drug Use Yes  . Types: Cocaine, Marijuana   Comment: Positive for Cocaine, THC, Amphetaines     Social History   Socioeconomic History  . Marital status: Single    Spouse name: Not on file  . Number of children: Not on file  . Years of education: Not on file  . Highest education level: Not on file  Occupational History  . Not on file  Social Needs  . Financial resource strain: Not on file  . Food insecurity:    Worry: Not on file    Inability: Not on file  . Transportation needs:    Medical: Not on file    Non-medical: Not on file  Tobacco Use  . Smoking status: Current Every Day Smoker    Packs/day: 0.25    Years: 5.00    Pack years: 1.25    Types: Cigarettes  . Smokeless tobacco: Never Used  Substance and Sexual Activity  . Alcohol use: Yes    Comment: socially  . Drug use: Yes  Types: Cocaine, Marijuana    Comment: Positive for Cocaine, THC, Amphetaines   . Sexual activity: Yes    Birth control/protection: None    Comment: depo-provera  Lifestyle  . Physical activity:    Days per week: Not on file    Minutes per session: Not on file  . Stress: Not on file  Relationships  . Social connections:    Talks on phone: Not on file    Gets together: Not on file    Attends religious service: Not on file    Active member of club or organization: Not on file    Attends meetings of clubs or organizations: Not on file    Relationship status: Not on file  Other Topics Concern  . Not on file   Social History Narrative  . Not on file    Hospital Course:  10/29/18 [facility] Assessment:  27 y.o. female who came to [facility] due to ongoing depressive thoughts she has been experiencing. Pt shares she has been trying to fight against these depressive thoughts--and a lot of anger--"for a minute." Pt acknowledges she's been experiencing SI, though without a plan. She denies any previous attempts and shares she has been hospitalized on one occasion, which was 3-4 years ago. Pt denies HI, AVH, and NSSIB. Pt shares she has been using marijuana daily and cocaine a few times per week. She denies any access to weapons and states she has court on November 21, 2018 for marijuana possession. She states she is single and that she lives by herself. Pt shares she has been getting around 3 hours of sleep per night. She states her appetite has been so-so but that she's lost more than 10 pounds recently. She states she hasn't seen a therapist since she was around age 51 and that she's never seen a psychiatrist. She shares she's capable of completing her ADLs independently. Her depression symptoms include hopelessness, worthlessness, anger, isolating, a decrease in pleasurable, more time in bed, a decrease in showering, and reduced hygiene.  Pt is oriented x4. Her recent and remote memory is intact. She was cooperative, yet tearful, throughout the assessment process. Pt's insight, judgement, and impulse control is impaired at this time.   10/30/18 [facility] MD Assessment: 27 year old single female, presented to hospital for severe, worsening depression, passive SI, neuro-vegetative symptoms as below. States " I have been crying a lot, almost every day".No psychotic symptoms.  Reports financial difficulties and unemployment as significant stressors. In addition to depression, she also reports history suggestive of intermittent explosive disorder, characterized as brief explosive, angry outbursts of short duration,  which she acknowledges have cost her job and relationship losses . Endorses cannabis and cocaine abuse, has been using cannabis daily and cocaine regularly but not daily. Admission UDS positive for cocaine and cannabis.  Patient remained on the [facility] unit for 3 days. The patient stabilized on medication and therapy. Patient was discharged on Zyprexa 5 mg QHS, Zoloft 50 mg Daily, Trazodone 50 mg QHS PRN. Patient has shown improvement with improved mood, affect, sleep, appetite, and interaction. Patient has attended group and participated. Patient has been seen in the day room interacting with peers and staff appropriately. Patient denies any SI/HI/AVH and contracts for safety. Patient agrees to follow up at [facility]. Patient is provided with prescriptions for their medications upon discharge.   Physical Findings: AIMS: Facial and Oral Movements Muscles of Facial Expression: None, normal Lips and Perioral Area: None, normal Jaw: None, normal Tongue:  None, normal,Extremity Movements Upper (arms, wrists, hands, fingers): None, normal Lower (legs, knees, ankles, toes): None, normal, Trunk Movements Neck, shoulders, hips: None, normal, Overall Severity Severity of abnormal movements (highest score from questions above): None, normal Incapacitation due to abnormal movements: None, normal Patient's awareness of abnormal movements (rate only patient's report): No Awareness, Dental Status Current problems with teeth and/or dentures?: No Does patient usually wear dentures?: No  CIWA:  CIWA-Ar Total: 1 COWS:  COWS Total Score: 1  Musculoskeletal: Strength & Muscle Tone: within normal limits Gait & Station: normal Patient leans: N/A  Psychiatric Specialty Exam: Physical Exam  Nursing note and vitals reviewed. Constitutional: She is oriented to person, place, and time. She appears well-developed and well-nourished.  Cardiovascular: Normal rate.  Respiratory: Effort normal.  Musculoskeletal: Normal  range of motion.  Neurological: She is alert and oriented to person, place, and time.  Skin: Skin is warm.    Review of Systems  Constitutional: Negative.   HENT: Negative.   Eyes: Negative.   Respiratory: Negative.   Cardiovascular: Negative.   Gastrointestinal: Negative.   Genitourinary: Negative.   Musculoskeletal: Negative.   Skin: Negative.   Neurological: Negative.   Endo/Heme/Allergies: Negative.   Psychiatric/Behavioral: Negative.     Blood pressure 125/80, pulse 100, temperature 98.4 F (36.9 C), temperature source Oral, resp. rate 16, height 5\' 7"  (1.702 m), weight 51.3 kg, unknown if currently breastfeeding.Body mass index is 17.7 kg/m.  General Appearance: Casual  Eye Contact:  Good  Speech:  Clear and Coherent and Normal Rate  Volume:  Normal  Mood:  Euthymic  Affect:  Congruent  Thought Process:  Goal Directed and Descriptions of Associations: Intact  Orientation:  Full (Time, Place, and Person)  Thought Content:  WDL  Suicidal Thoughts:  No  Homicidal Thoughts:  No  Memory:  Immediate;   Good Recent;   Good Remote;   Good  Judgement:  Fair  Insight:  Fair  Psychomotor Activity:  Normal  Concentration:  Concentration: Good and Attention Span: Good  Recall:  Good  Fund of Knowledge:  Good  Language:  Good  Akathisia:  No  Handed:  Right  AIMS (if indicated):     Assets:  Communication Skills Desire for Improvement Financial Resources/Insurance Housing Physical Health Transportation  ADL's:  Intact  Cognition:  WNL  Sleep:  Number of Hours: 6.75     Have you used any form of tobacco in the last 30 days? (Cigarettes, Smokeless Tobacco, Cigars, and/or Pipes): Yes  Has this patient used any form of tobacco in the last 30 days? (Cigarettes, Smokeless Tobacco, Cigars, and/or Pipes) Yes, Yes, A prescription for an FDA-approved tobacco cessation medication was offered at discharge and the patient refused  Blood Alcohol level:  Lab Results  Component  Value Date   ETH 174 (H) 03/26/2018   ETH 67 (H) 11/04/2014    Metabolic Disorder Labs:  Lab Results  Component Value Date   HGBA1C 5.2 10/30/2018   MPG 102.54 10/30/2018   No results found for: PROLACTIN Lab Results  Component Value Date   CHOL 163 10/30/2018   TRIG 88 10/30/2018   HDL 46 10/30/2018   CHOLHDL 3.5 10/30/2018   VLDL 18 10/30/2018   LDLCALC 99 10/30/2018    See Psychiatric Specialty Exam and Suicide Risk Assessment completed by Attending Physician prior to discharge.  Discharge destination:  Home  Is patient on multiple antipsychotic therapies at discharge:  No   Has Patient had three or more failed trials  of antipsychotic monotherapy by history:  No  Recommended Plan for Multiple Antipsychotic Therapies: NA   Allergies as of 11/01/2018   No Known Allergies     Medication List    TAKE these medications     Indication  OLANZapine 5 MG tablet Commonly known as:  ZYPREXA Take 1 tablet (5 mg total) by mouth at bedtime. For mood control  Indication:  mood stability   sertraline 50 MG tablet Commonly known as:  ZOLOFT Take 1 tablet (50 mg total) by mouth daily. For mood control Start taking on:  11/02/2018  Indication:  mood stability   traZODone 50 MG tablet Commonly known as:  DESYREL Take 1 tablet (50 mg total) by mouth at bedtime as needed for sleep.  Indication:  Trouble Sleeping      Follow-up [facility]. Go on 10/30/2018.   Specialty:  Behavioral Health Why:  Office is closed, Friday 11/01/18. Please be sure to follow up duing walk in hours. Walk-in hours are Monday-Friday from 8:00am-5:00pm. Be sure to rbing ID and any discharge paperwork.  [address] [place] [address] [phone]           Follow-up recommendations:  Continue activity as tolerated. Continue diet as recommended by your PCP. Ensure to keep all appointments with outpatient providers.  Comments:  Patient is instructed  prior to discharge to: Take all medications as prescribed by his/her mental healthcare provider. Report any adverse effects and or reactions from the medicines to his/her outpatient provider promptly. Patient has been instructed & cautioned: To not engage in alcohol and or illegal drug use while on prescription medicines. In the event of worsening symptoms, patient is instructed to call the crisis hotline, 911 and or go to the nearest ED for appropriate evaluation and treatment of symptoms. To follow-up with his/her primary care provider for your other medical issues, concerns and or health care needs.    Signed: Gerlene Burdock Money, FNP 11/01/2018, 11:37 AM   Patient seen, Suicide Assessment Completed.  Disposition Plan Reviewed

## 2018-11-01 NOTE — Discharge Instructions (Signed)
[facility]--Office is closed, Friday 11/01/18. Please be sure to follow up duing walk in hours. Walk-in hours are Monday-Friday from 8:00am-5:00pm. Be sure to rbing ID and any discharge paperwork.

## 2018-11-01 NOTE — [facility] Suicide Risk Assessment (Signed)
[facility]   Principal Problem: Severe recurrent major depression without psychotic features [facility](HCC) Discharge Diagnoses: Principal Problem:   Severe recurrent major depression without psychotic features (HCC)   Total Time spent with patient: 30 minutes  Musculoskeletal: Strength & Muscle Tone: within normal limits Gait & Station: normal Patient leans: N/A  Psychiatric Specialty Exam: ROS denies headache, no chest pain, no shortness of breath, no vomiting  Blood pressure 125/80, pulse 100, temperature 98.4 F (36.9 C), temperature source Oral, resp. rate 16, height 5\' 7"  (1.702 m), weight 51.3 kg, unknown if currently breastfeeding.Body mass index is 17.7 kg/m.  General Appearance: Well Groomed  Eye Contact::  Good  Speech:  Normal Rate409  Volume:  Normal  Mood:  mood improved, reports she is feeling " a lot better", today presents euthymic  Affect:  Appropriate and fuller in range  Thought Process:  Linear and Descriptions of Associations: Intact  Orientation:  Full (Time, Place, and Person)  Thought Content:  no hallucinations, no delusions, not internally preoccupied   Suicidal Thoughts:  No denies suicidal or self injurious ideations, denies homicidal or violent ideations  Homicidal Thoughts:  No   Memory:  recent and remote grossly intact   Judgement:  Other:  improving   Insight:  improving   Psychomotor Activity:  Normal  Concentration:  Good  Recall:  Good  Fund of Knowledge:Good  Language: Good  Akathisia:  Negative  Handed:  Right  AIMS (if indicated):   no abnormal or involuntary movements noted or reported   Assets:  Communication Skills Desire for Improvement Resilience  Sleep:  Number of Hours: 6.75  Cognition: WNL  ADL's:  Intact   Mental Status Per Nursing Assessment::   On Admission:  NA  Demographic Factors:  27 year old single female , 5 children, lives alone, unemployed   Loss Factors: Unemployment, financial  difficulties  Historical Factors: One prior psychiatric admission at age 27, no history of suicide attempts, history of brief angry/explosive outbursts   Risk Reduction Factors:    Sense of responsibility to family, Living with another person, especially a relative and Positive coping skills or problem solving skills  Continued Clinical Symptoms:  At this time patient is alert, attentive, well related, denies depression and currently presents euthymic, with full range of affect, no thought disorder, no suicidal or self injurious ideations, no homicidal or violent ideations, no hallucinations,no delusions, not internally preoccupied, future oriented . Denies medication side effects. Side effects discussed, including risk of sedation, movement disorders, metabolic disorders, weight gain on Zyprexa  Behavior on unit in good control, pleasant on approach.   Cognitive Features That Contribute To Risk:  No gross cognitive deficits noted upon discharge. Is alert , attentive, and oriented x 3    Suicide Risk:  Mild:  Suicidal ideation of limited frequency, intensity, duration, and specificity.  There are no identifiable plans, no associated intent, mild dysphoria and related symptoms, good self-control (both objective and subjective assessment), few other risk factors, and identifiable protective factors, including available and accessible social support.  Follow-up Information    [facility]. Go on 10/30/2018.   Specialty:  Behavioral Health Why:  Hospital follow up appointment  Contact information: [address] [place] [phone]           Plan Of Care/Follow-up recommendations:  Activity:  as tolerated  Diet:  regular Tests:  NA Other:  See below  Patient is requesting discharge and is leaving unit in good spirits.There  are no current grounds for involuntary commitment. Plans to return home. Follow up as above.  Craige Cotta, MD 11/01/2018, 11:20 AM

## 2018-11-01 NOTE — Progress Notes (Signed)
Pt received both written and verbal discharge instructions. Pt verbalized understanding of d/c instructions. Pt received SRA, AVS, transitional record, suicide prevention, prescriptions and sample meds. Pt received two bus passes for transportation. Pt safely discharged to the unit.

## 2018-11-01 NOTE — Progress Notes (Signed)
  [facility] :  Will you be returning to the same living situation after discharge:  Yes,  patient reports she is discharging home At discharge, do you have transportation home?: Yes,  bus passes Do you have the ability to pay for your medications: No.  Release of information consent forms completed and in the chart;  Patient's signature needed at discharge.  Patient to Follow up at: Follow-up Information    [facility]. Go on 10/30/2018.   Specialty:  Behavioral Health Why:  Office is closed, Friday 11/01/18. Please be sure to follow up duing walk in hours. Walk-in hours are Monday-Friday from 8:00am-5:00pm. Be sure to rbing ID and any discharge paperwork.  Contact information: [address] [place] [address] [phone]           Next level of care provider has [facility] to [facility]:yes  Safety Planning and Suicide Prevention discussed: Yes,  with the patient   Have you used any form of tobacco in the last 30 days? (Cigarettes, Smokeless Tobacco, Cigars, and/or Pipes): Yes  Has patient been referred to the Quitline?: Patient refused referral  Patient has been referred for addiction treatment: N/A  Maeola SarahJolan E Kollen Armenti, LCSWA 11/01/2018, 11:26 AM
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
## 2019-07-27 ENCOUNTER — Other Ambulatory Visit: Payer: Self-pay

## 2019-07-27 ENCOUNTER — Emergency Department (HOSPITAL_COMMUNITY)
Admission: EM | Admit: 2019-07-27 | Discharge: 2019-07-28 | Disposition: A | Discharge status: Home / Self Care | Payer: Self-pay | Attending: Emergency Medicine | Admitting: Emergency Medicine

## 2019-07-27 ENCOUNTER — Encounter (HOSPITAL_COMMUNITY): Payer: Self-pay | Admitting: Emergency Medicine

## 2019-07-27 ENCOUNTER — Emergency Department (HOSPITAL_COMMUNITY): Payer: Self-pay

## 2019-07-27 DIAGNOSIS — Z79899 Other long term (current) drug therapy: Secondary | ICD-10-CM | POA: Insufficient documentation

## 2019-07-27 DIAGNOSIS — F17210 Nicotine dependence, cigarettes, uncomplicated: Secondary | ICD-10-CM | POA: Insufficient documentation

## 2019-07-27 DIAGNOSIS — R0789 Other chest pain: Secondary | ICD-10-CM | POA: Insufficient documentation

## 2019-07-27 DIAGNOSIS — J45909 Unspecified asthma, uncomplicated: Secondary | ICD-10-CM | POA: Insufficient documentation

## 2019-07-27 LAB — CBC
HCT: 36.6 % (ref 36.0–46.0)
Hemoglobin: 12 g/dL (ref 12.0–15.0)
MCH: 29 pg (ref 26.0–34.0)
MCHC: 32.8 g/dL (ref 30.0–36.0)
MCV: 88.4 fL (ref 80.0–100.0)
Platelets: 196 10*3/uL (ref 150–400)
RBC: 4.14 MIL/uL (ref 3.87–5.11)
RDW: 14.3 % (ref 11.5–15.5)
WBC: 8.2 10*3/uL (ref 4.0–10.5)
nRBC: 0 % (ref 0.0–0.2)

## 2019-07-27 LAB — BASIC METABOLIC PANEL
Anion gap: 13 (ref 5–15)
BUN: 11 mg/dL (ref 6–20)
CO2: 20 mmol/L — ABNORMAL LOW (ref 22–32)
Calcium: 9.2 mg/dL (ref 8.9–10.3)
Chloride: 104 mmol/L (ref 98–111)
Creatinine, Ser: 1.16 mg/dL — ABNORMAL HIGH (ref 0.44–1.00)
GFR calc Af Amer: >60 mL/min (ref >60)
GFR calc non Af Amer: >60 mL/min (ref >60)
Glucose, Bld: 126 mg/dL — ABNORMAL HIGH (ref 70–99)
Potassium: 3.2 mmol/L — ABNORMAL LOW (ref 3.5–5.1)
Sodium: 137 mmol/L (ref 135–145)

## 2019-07-27 LAB — PROTIME-INR
INR: 1.1 (ref 0.8–1.2)
Prothrombin Time: 14.1 seconds (ref 11.4–15.2)

## 2019-07-27 LAB — D-DIMER, QUANTITATIVE: D-Dimer, Quant: 0.5 ug/mL-FEU (ref 0.00–0.50)

## 2019-07-27 LAB — I-STAT BETA HCG BLOOD, ED (MC, WL, AP ONLY): I-stat hCG, quantitative: <5 m[IU]/mL (ref <5)

## 2019-07-27 LAB — TROPONIN I (HIGH SENSITIVITY): Troponin I (High Sensitivity): 3 ng/L (ref <18)

## 2019-07-27 MED ORDER — MORPHINE SULFATE (PF) 4 MG/ML IV SOLN
4.0000 mg | Freq: Once | INTRAVENOUS | Status: AC
  Administered 2019-07-27: 4 mg via INTRAVENOUS
  Filled 2019-07-27: qty 1

## 2019-07-27 NOTE — ED Notes (Signed)
Pt requested supplemental O2 via [place] be in placed; stated it helped with her right sided chest pain; informed pt SpO2 is 100% at this time on RA; however placed [place] for comfort

## 2019-07-27 NOTE — ED Notes (Signed)
Patient transported to X-ray 

## 2019-07-27 NOTE — ED Provider Notes (Signed)
[facility]   CSN: [id number] Arrival date & time: 07/27/19  2217     History   Chief Complaint Chief Complaint  Patient presents with  . Chest Pain    HPI Jaime Mendoza is a 28 y.o. female.     Patient presents to the emergency department with a chief complaint of right-sided chest pain.  She states the symptoms began just prior to arrival.  She describes the pain as sharp and stabbing.  She also feels like her heart has been racing.  She states that she has had racing heart symptoms intermittently throughout the day today.  Reports some mild associated shortness of breath.  She denies any fever, chills, cough.  Per EMS, she had a 4-minute run of SVT, but otherwise has been sinus tach.  She states that she was drinking alcohol last night, woke up this morning with a headache, and took some Excedrin.  She denies any cocaine use.  Denies any stimulants.  She was given aspirin by EMS.  She denies any trauma to her chest.     Past Medical History:  Diagnosis Date  . Anxiety   . Asthma   . Chlamydia   . Depression   . HSV-2 infection complicating pregnancy   . Preterm labor   . SVD (spontaneous vaginal delivery) 01/30/2012    Patient Active Problem List   Diagnosis Date Noted  . Severe recurrent major depression without psychotic features (HCC) 10/30/2018  . S/P cesarean section and BTS  09/24/2016  . History of PCR DNA positive for HSV2 09/18/2016  . History of premature delivery, currently pregnant 07/05/2016  . Supervision of normal intrauterine pregnancy in multigravida in first trimester 03/08/2016  . MDD (major depressive disorder), single episode 11/08/2014    Past Surgical History:  Procedure Laterality Date  . CESAREAN SECTION N/A 09/24/2016   Procedure: CESAREAN SECTION;  Surgeon: Tereso NewcomerUgonna A Anyanwu, MD;  Location: [facility];  Service: Obstetrics;  Laterality: N/A;  . NO PAST SURGERIES       OB  History    Gravida  7   Para  5   Term  4   Preterm  1   AB  2   Living  5     SAB  2   TAB  0   Ectopic  0   Multiple  0   Live Births  5            Home Medications    Prior to Admission medications   Medication Sig Start Date End Date Taking? Authorizing Provider  OLANZapine (ZYPREXA) 5 MG tablet Take 1 tablet (5 mg total) by mouth at bedtime. For mood control 11/01/18   Money, Gerlene Burdockravis B, FNP  sertraline (ZOLOFT) 50 MG tablet Take 1 tablet (50 mg total) by mouth daily. For mood control 11/02/18   Money, Gerlene Burdockravis B, FNP  traZODone (DESYREL) 50 MG tablet Take 1 tablet (50 mg total) by mouth at bedtime as needed for sleep. 11/01/18   Money, Gerlene Burdockravis B, FNP    Family History Family History  Problem Relation Age of Onset  . Cancer Father        colon  . Seizures Sister   . Diabetes Maternal Grandmother   . Heart disease Maternal Grandfather   . Heart disease Paternal Grandfather   . Anesthesia problems Neg Hx   . Hypotension Neg Hx   . Malignant hyperthermia Neg Hx   . Pseudochol  deficiency Neg Hx     Social History Social History   Tobacco Use  . Smoking status: Current Every Day Smoker    Packs/day: 0.25    Years: 5.00    Pack years: 1.25    Types: Cigarettes  . Smokeless tobacco: Never Used  Substance Use Topics  . Alcohol use: Yes    Comment: socially  . Drug use: Yes    Types: Cocaine, Marijuana    Comment: Positive for Cocaine, THC, Amphetaines      Allergies   Patient has no known allergies.   Review of Systems Review of Systems  All other systems reviewed and are negative.    Physical Exam Updated Vital Signs BP (!) 129/91 (BP Location: Right Arm)   Pulse (!) 129   Temp 98.2 F (36.8 C) (Oral)   Resp 16   Ht 5\' 11"  (1.803 m)   Wt 50.8 kg   SpO2 100%   BMI 15.62 kg/m   Physical Exam Vitals signs and nursing note reviewed.  Constitutional:      General: She is not in acute distress.    Appearance: She is  well-developed.  HENT:     Head: Normocephalic and atraumatic.  Eyes:     Conjunctiva/sclera: Conjunctivae normal.  Neck:     Musculoskeletal: Neck supple.  Cardiovascular:     Rate and Rhythm: Normal rate and regular rhythm.     Heart sounds: No murmur.  Pulmonary:     Effort: Pulmonary effort is normal. No respiratory distress.     Breath sounds: Normal breath sounds.     Comments: Right anterior chest wall tender to palpation, no bony abnormality or deformity Chest:     Chest wall: Tenderness present.  Abdominal:     Palpations: Abdomen is soft.     Tenderness: There is no abdominal tenderness.  Musculoskeletal: Normal range of motion.  Skin:    General: Skin is warm and dry.  Neurological:     General: No focal deficit present.     Mental Status: She is alert and oriented to person, place, and time.  Psychiatric:        Mood and Affect: Mood normal.        Behavior: Behavior normal.      ED Treatments / Results  Labs (all labs ordered are listed, but only abnormal results are displayed) Labs Reviewed  BASIC METABOLIC PANEL - Abnormal; Notable for the following components:      Result Value   Potassium 3.2 (*)    CO2 20 (*)    Glucose, Bld 126 (*)    Creatinine, Ser 1.16 (*)    All other components within normal limits  RAPID URINE DRUG SCREEN, HOSP PERFORMED - Abnormal; Notable for the following components:   Opiates POSITIVE (*)    Amphetamines POSITIVE (*)    Tetrahydrocannabinol POSITIVE (*)    All other components within normal limits  URINALYSIS, ROUTINE W REFLEX MICROSCOPIC - Abnormal; Notable for the following components:   Color, Urine STRAW (*)    All other components within normal limits  CBC  PROTIME-INR  D-DIMER, QUANTITATIVE (NOT AT [facility])  I-STAT BETA HCG BLOOD, ED ([facility], [facility], [facility] ONLY)  TROPONIN I (HIGH SENSITIVITY)  TROPONIN I (HIGH SENSITIVITY)    EKG EKG Interpretation  Date/Time:  Sunday July 27 2019 22:39:53 EDT Ventricular Rate:   125 PR Interval:  118 QRS Duration: 126 QT Interval:  375 QTC Calculation: 521 R Axis:   -78 Text Interpretation:  Atrial fibrillation Ventricular tachycardia, unsustained Nonspecific IVCD with LAD Nonspecific T abnormalities, inferior leads similar to previous Confirmed by Theotis Burrow [phone]) on 07/27/2019 10:44:56 PM   Radiology No results found.  Procedures Procedures (including critical care time)  Medications Ordered in ED Medications - No data to display   Initial Impression / Assessment and Plan / ED Course  I have reviewed the triage vital signs and the nursing notes.  Pertinent labs & imaging results that were available during my care of the patient were reviewed by me and considered in my medical decision making (see chart for details).        Patient with right-sided chest pain.  Noted to be tachycardic in the ED.  Had a run of SVT per EMS.  Was then sinus tach.    Patient is not hypoxic.  Her d-dimer is negative.  Doubt PE.  Chest wall pain is very reproducible.  Chest x-ray is negative for fracture.  No evidence of pneumothorax.  UDS shows amphetamines, also history of cocaine.  I have encouraged the patient to avoid these substances.  I have advised her to follow-up with cardiology as needed.  Will discharge home with Flexeril and ibuprofen.  Patient understands and agrees to plan.  Final Clinical Impressions(s) / ED Diagnoses   Final diagnoses:  Chest wall pain    ED Discharge Orders    None       Montine Circle, PA-C 07/28/19 0216    Little, Wenda Overland, MD 07/29/19 [phone]

## 2019-07-27 NOTE — ED Triage Notes (Signed)
Patient arrived with EMS from home reports central chest pain with SOB onset this evening , no emesis or diaphoresis , she received ASA 324 mg po by EMS , episodes of SVT/Tachycardia prior to arrival .

## 2019-07-28 LAB — URINALYSIS, ROUTINE W REFLEX MICROSCOPIC
Bilirubin Urine: NEGATIVE
Glucose, UA: NEGATIVE mg/dL
Hgb urine dipstick: NEGATIVE
Ketones, ur: NEGATIVE mg/dL
Leukocytes,Ua: NEGATIVE
Nitrite: NEGATIVE
Protein, ur: NEGATIVE mg/dL
Specific Gravity, Urine: 1.005 (ref 1.005–1.030)
pH: 7 (ref 5.0–8.0)

## 2019-07-28 LAB — RAPID URINE DRUG SCREEN, HOSP PERFORMED
Amphetamines: POSITIVE — AB
Barbiturates: NOT DETECTED
Benzodiazepines: NOT DETECTED
Cocaine: NOT DETECTED
Opiates: POSITIVE — AB
Tetrahydrocannabinol: POSITIVE — AB

## 2019-07-28 LAB — TROPONIN I (HIGH SENSITIVITY): Troponin I (High Sensitivity): 5 ng/L (ref <18)

## 2019-07-28 MED ORDER — KETOROLAC TROMETHAMINE 30 MG/ML IJ SOLN
30.0000 mg | Freq: Once | INTRAMUSCULAR | Status: AC
  Administered 2019-07-28: 30 mg via INTRAVENOUS
  Filled 2019-07-28: qty 1

## 2019-07-28 MED ORDER — IBUPROFEN 800 MG PO TABS: 800.0000 mg | ORAL_TABLET | Freq: Three times a day (TID) | ORAL | 0 refills | Status: DC

## 2019-07-28 MED ORDER — CYCLOBENZAPRINE HCL 10 MG PO TABS: 10.0000 mg | ORAL_TABLET | Freq: Two times a day (BID) | ORAL | 0 refills | Status: DC | PRN

## 2019-07-28 NOTE — Discharge Instructions (Signed)
Please avoid stimulants/amphetamines, do not use cocaine, all of these substances can harm you and cause symptoms similar to what you've experienced tonight.  Please follow-up with a cardiologist, you may need to wear a heart monitor for a few days.  You will need to see the cardiologist for this.  Please take the prescribed medications as directed.   Return for new or worsening symptoms.

## 2019-07-28 NOTE — ED Provider Notes (Signed)
Patient seen/examined in the Emergency Department in conjunction with Advanced Practice Provider [facility] Patient reports chest pain Exam : awake/alert, no loud murmurs, reproducible CP noted Plan: will treat pain and reassess She will need repeat EKG toradol ordered    Ripley Fraise, MD 07/28/19 0102

## 2019-07-29 ENCOUNTER — Ambulatory Visit: Payer: Self-pay | Admitting: Internal Medicine

## 2019-11-16 ENCOUNTER — Encounter (HOSPITAL_COMMUNITY): Payer: Self-pay

## 2019-11-16 ENCOUNTER — Other Ambulatory Visit: Payer: Self-pay

## 2019-11-16 ENCOUNTER — Ambulatory Visit (HOSPITAL_COMMUNITY)
Admission: EM | Admit: 2019-11-16 | Discharge: 2019-11-16 | Disposition: A | Discharge status: Home / Self Care | Payer: Self-pay | Attending: Emergency Medicine | Admitting: Emergency Medicine

## 2019-11-16 DIAGNOSIS — Z3202 Encounter for pregnancy test, result negative: Secondary | ICD-10-CM

## 2019-11-16 DIAGNOSIS — Z113 Encounter for screening for infections with a predominantly sexual mode of transmission: Secondary | ICD-10-CM | POA: Insufficient documentation

## 2019-11-16 DIAGNOSIS — Z8619 Personal history of other infectious and parasitic diseases: Secondary | ICD-10-CM

## 2019-11-16 DIAGNOSIS — N760 Acute vaginitis: Secondary | ICD-10-CM | POA: Insufficient documentation

## 2019-11-16 DIAGNOSIS — Z202 Contact with and (suspected) exposure to infections with a predominantly sexual mode of transmission: Secondary | ICD-10-CM

## 2019-11-16 DIAGNOSIS — N898 Other specified noninflammatory disorders of vagina: Secondary | ICD-10-CM | POA: Insufficient documentation

## 2019-11-16 LAB — POC URINE PREG, ED: Preg Test, Ur: NEGATIVE

## 2019-11-16 LAB — POCT PREGNANCY, URINE: Preg Test, Ur: NEGATIVE

## 2019-11-16 MED ORDER — METRONIDAZOLE 500 MG PO TABS
2000.0000 mg | ORAL_TABLET | Freq: Once | ORAL | Status: AC
  Administered 2019-11-16: 2000 mg via ORAL

## 2019-11-16 MED ORDER — ONDANSETRON 4 MG PO TBDP
8.0000 mg | ORAL_TABLET | Freq: Once | ORAL | Status: AC
  Administered 2019-11-16: 8 mg via ORAL

## 2019-11-16 MED ORDER — CEFTRIAXONE SODIUM 250 MG IJ SOLR
250.0000 mg | Freq: Once | INTRAMUSCULAR | Status: AC
  Administered 2019-11-16: 17:00:00 250 mg via INTRAMUSCULAR

## 2019-11-16 MED ORDER — FAMOTIDINE 20 MG PO TABS
ORAL_TABLET | ORAL | Status: AC
  Filled 2019-11-16: qty 2

## 2019-11-16 MED ORDER — AZITHROMYCIN 250 MG PO TABS
1000.0000 mg | ORAL_TABLET | Freq: Once | ORAL | Status: AC
  Administered 2019-11-16: 1000 mg via ORAL

## 2019-11-16 MED ORDER — ONDANSETRON 4 MG PO TBDP
ORAL_TABLET | ORAL | Status: AC
  Filled 2019-11-16: qty 2

## 2019-11-16 MED ORDER — AZITHROMYCIN 250 MG PO TABS
ORAL_TABLET | ORAL | Status: AC
  Filled 2019-11-16: qty 4

## 2019-11-16 MED ORDER — CEFTRIAXONE SODIUM 250 MG IJ SOLR
INTRAMUSCULAR | Status: AC
  Filled 2019-11-16: qty 250

## 2019-11-16 MED ORDER — METRONIDAZOLE 500 MG PO TABS
ORAL_TABLET | ORAL | Status: AC
  Filled 2019-11-16: qty 4

## 2019-11-16 MED ORDER — FAMOTIDINE 20 MG PO TABS
40.0000 mg | ORAL_TABLET | Freq: Once | ORAL | Status: AC
  Administered 2019-11-16: 40 mg via ORAL

## 2019-11-16 NOTE — ED Provider Notes (Signed)
HPI  SUBJECTIVE:  Jaime Mendoza is a 28 y.o. female who presents with nonodorous yellowish-green vaginal discharge for 2 weeks.  She reports occasional nausea, dysuria, intermittent, minutes long sharp pelvic and low back pain.  No vomiting, fevers, urinary urgency, frequency, cloudy or odorous urine, hematuria.  No genital rash, itching.  No alleviating or aggravating factors.  She has not tried anything for this.  Patient states that she recently got back together with her boyfriend.  He is concerned that he may have had a sexual partner while they were apart.  She is not sure if he is having any symptoms.  She states this feels like previous STD's. she has a past medical history of gonorrhea, chlamydia, HSV, trichomonas, BV, yeast.  No history of HIV, syphilis.  She also has a history of asthma, UTI, pyelonephritis.  No history of PID, ectopic pregnancy, nephrolithiasis.  LMP: 11/23.  Denies the possibility being pregnant.  PMD: None.    Past Medical History:  Diagnosis Date  . Anxiety   . Asthma   . Chlamydia   . Depression   . HSV-2 infection complicating pregnancy   . Preterm labor   . SVD (spontaneous vaginal delivery) 01/30/2012    Past Surgical History:  Procedure Laterality Date  . CESAREAN SECTION N/A 09/24/2016   Procedure: CESAREAN SECTION;  Surgeon: Osborne Oman, MD;  Location: [place];  Service: Obstetrics;  Laterality: N/A;  . NO PAST SURGERIES      Family History  Problem Relation Age of Onset  . Cancer Father        colon  . Seizures Sister   . Diabetes Maternal Grandmother   . Heart disease Maternal Grandfather   . Heart disease Paternal Grandfather   . Anesthesia problems Neg Hx   . Hypotension Neg Hx   . Malignant hyperthermia Neg Hx   . Pseudochol deficiency Neg Hx     Social History   Tobacco Use  . Smoking status: Current Every Day Smoker    Packs/day: 0.25    Years: 5.00    Pack years: 1.25    Types: Cigarettes  . Smokeless  tobacco: Never Used  Substance Use Topics  . Alcohol use: Yes    Comment: socially  . Drug use: Yes    Types: Cocaine, Marijuana    Comment: Positive for Cocaine, THC, Amphetaines     No current facility-administered medications for this encounter.  Current Outpatient Medications:  .  cyclobenzaprine (FLEXERIL) 10 MG tablet, Take 1 tablet (10 mg total) by mouth 2 (two) times daily as needed for muscle spasms., Disp: 20 tablet, Rfl: 0 .  ibuprofen (ADVIL) 800 MG tablet, Take 1 tablet (800 mg total) by mouth 3 (three) times daily., Disp: 21 tablet, Rfl: 0 .  OLANZapine (ZYPREXA) 5 MG tablet, Take 1 tablet (5 mg total) by mouth at bedtime. For mood control, Disp: 30 tablet, Rfl: 0 .  sertraline (ZOLOFT) 50 MG tablet, Take 1 tablet (50 mg total) by mouth daily. For mood control, Disp: 30 tablet, Rfl: 0 .  traZODone (DESYREL) 50 MG tablet, Take 1 tablet (50 mg total) by mouth at bedtime as needed for sleep., Disp: 30 tablet, Rfl: 0  No Known Allergies   ROS  As noted in HPI.   Physical Exam  BP 104/69 (BP Location: Left Arm)   Pulse 90   Temp 98.6 F (37 C) (Oral)   Resp 18   LMP 10/27/2019   SpO2 98%  Constitutional: Well developed, well nourished, no acute distress Eyes:  EOMI, conjunctiva normal bilaterally HENT: Normocephalic, atraumatic,mucus membranes moist Respiratory: Normal inspiratory effort Cardiovascular: Normal rate GI: nondistended soft, nontender. No suprapubic, flank tenderness  back: No CVA tenderness GU: External genitalia normal.  Normal vaginal mucosa.  Normal os. Thin oderous white/ yellowish vaginal discharge.  Uterus smooth, NT. No CMT. No adnexal tenderness. No adnexal masses.  Chaperone present during exam skin: No rash, skin intact Musculoskeletal: no deformities Neurologic: Alert & oriented x 3, no focal neuro deficits Psychiatric: Speech and behavior appropriate   ED Course   Medications  cefTRIAXone (ROCEPHIN) injection 250 mg (250 mg  Intramuscular Given 11/16/19 1711)  azithromycin (ZITHROMAX) tablet 1,000 mg (1,000 mg Oral Given 11/16/19 1711)  ondansetron (ZOFRAN-ODT) disintegrating tablet 8 mg (8 mg Oral Given 11/16/19 1711)  metroNIDAZOLE (FLAGYL) tablet 2,000 mg (2,000 mg Oral Given 11/16/19 1711)  famotidine (PEPCID) tablet 40 mg (40 mg Oral Given 11/16/19 1712)    Orders Placed This Encounter  Procedures  . POC urine pregnancy    Standing Status:   Standing    Number of Occurrences:   1  . Pregnancy, urine POC    Standing Status:   Standing    Number of Occurrences:   1    Results for orders placed or performed during the hospital encounter of 11/16/19 (from the past 24 hour(s))  POC urine pregnancy     Status: None   Collection Time: 11/16/19  5:21 PM  Result Value Ref Range   Preg Test, Ur NEGATIVE NEGATIVE  Pregnancy, urine POC     Status: None   Collection Time: 11/16/19  5:21 PM  Result Value Ref Range   Preg Test, Ur NEGATIVE NEGATIVE   No results found.  ED Clinical Impression  1. Acute vaginitis   2. Vaginal discharge   3. Screening examination for STD (sexually transmitted disease)     ED Assessment/Plan  Urine pregnancy negative.  Suspect gonorrhea /chlamydia versus BV vs trichomoniasis.  Patient is concerned about all of these and requests treatment for all of these today.  Advised her that this may make her nauseous, but she has decided to proceed.  Will give 8 mg of Zofran and 40 mg of Pepcid in addition to 250 mg Rocephin IM, Zithromax 1 g p.o., 2 g Flagyl p.o. Advised pt to refrain from sexual contact until she knows lab results, symptoms resolve, and partner(s) are treated if necessary. Pt provided working phone number. Follow-up with PMD of choice as needed.  Providing primary care list for ongoing care.  Discussed labs, MDM, plan and followup with patient. Pt agrees with plan.   Meds ordered this encounter  Medications  . cefTRIAXone (ROCEPHIN) injection 250 mg    Order  Specific Question:   Antibiotic Indication:    Answer:   STD  . azithromycin (ZITHROMAX) tablet 1,000 mg  . ondansetron (ZOFRAN-ODT) disintegrating tablet 8 mg  . metroNIDAZOLE (FLAGYL) tablet 2,000 mg  . famotidine (PEPCID) tablet 40 mg    *This clinic note was created using Scientist, clinical (histocompatibility and immunogenetics). Therefore, there may be occasional mistakes despite careful proofreading.  ?     Domenick Gong, MD 11/17/19 [phone]

## 2019-11-16 NOTE — Discharge Instructions (Addendum)
Your urine pregnancy was negative.  We have treated you for gonorrhea, chlamydia and trichomonas today. Give [place] a working phone number so that we can contact you if needed. Refrain from sexual contact until you know your results and your partner(s) are treated if necessary. Return to the ER if you get worse, have a fever >100.4, or for any concerns.   Below is a list of primary care practices who are taking new patients for you to follow-up with.  [facility] at [facility] [address] [place], [place] [address] [phone]  [place], [place] [address] [phone]  [facility] [phone] [place] [address]  [facility]: [place]  [phone]  [place] [facility]: [place]   [phone]  [facility]: [address] [place]  [phone]  [facility] : [address] [phone]  [facility]: [address] [place] [address] [phone]  [facility]: [place] [phone]  Dr. Blanchie Serve [place]  [phone]  Dr. Benito Mccreedy, [facility]. [place], [place] [address]  [phone]  Go to www.goodrx.com to look up your medications. This will give you a list of where you can find your prescriptions at the most affordable prices. Or ask the pharmacist what the cash price is, or if they have any other discount programs available to help make your medication more affordable. This can be less expensive  than what you would pay with insurance.

## 2019-11-16 NOTE — ED Triage Notes (Signed)
Pt presents for STD testing; pt states she has burning with urination, vaginal discharge, and pelvic pain X 2 weeks.

## 2019-11-17 LAB — RPR: RPR Ser Ql: NONREACTIVE

## 2019-11-17 LAB — HIV ANTIBODY (ROUTINE TESTING W REFLEX): HIV Screen 4th Generation wRfx: NONREACTIVE

## 2019-11-18 ENCOUNTER — Telehealth (HOSPITAL_COMMUNITY): Payer: Self-pay | Admitting: Emergency Medicine

## 2019-11-18 LAB — CERVICOVAGINAL ANCILLARY ONLY
Bacterial vaginitis: NEGATIVE
Candida vaginitis: POSITIVE — AB
Chlamydia: NEGATIVE
Neisseria Gonorrhea: POSITIVE — AB
Trichomonas: POSITIVE — AB

## 2019-11-18 MED ORDER — FLUCONAZOLE 150 MG PO TABS: 150.0000 mg | ORAL_TABLET | Freq: Once | ORAL | 0 refills | Status: AC

## 2019-11-18 NOTE — Telephone Encounter (Signed)
Test for gonorrhea was positive. This was treated at the urgent care visit with IM rocephin 250mg  and po zithromax 1g. Pt needs education to refrain from sexual intercourse for 7 days after treatment to give the medicine time to work. Sexual partners need to be notified and tested/treated. Condoms may reduce risk of reinfection. Recheck or followup with PCP for further evaluation if symptoms are not improving. [facility] notified.   Trichomonas is positive. Rx metronidazole was given at the urgent care visit. Pt needs education to please refrain from sexual intercourse for 7 days to give the medicine time to work. Sexual partners need to be notified and tested/treated. Condoms may reduce risk of reinfection. Recheck for further evaluation if symptoms are not improving.   Test for candida (yeast) was positive.  Prescription for fluconazole 150mg  po now, repeat dose in 3d if needed, #2 no refills, sent to the pharmacy of record.  Recheck or followup with PCP for further evaluation if symptoms are not improving.    Patient contacted by phone and made aware of    results. Pt verbalized understanding and had all questions answered.

## 2020-02-06 ENCOUNTER — Ambulatory Visit (HOSPITAL_COMMUNITY)
Admission: EM | Admit: 2020-02-06 | Discharge: 2020-02-06 | Disposition: A | Discharge status: Home / Self Care | Payer: Self-pay | Attending: Family Medicine | Admitting: Family Medicine

## 2020-02-06 ENCOUNTER — Other Ambulatory Visit: Payer: Self-pay

## 2020-02-06 ENCOUNTER — Encounter (HOSPITAL_COMMUNITY): Payer: Self-pay | Admitting: Emergency Medicine

## 2020-02-06 DIAGNOSIS — Z202 Contact with and (suspected) exposure to infections with a predominantly sexual mode of transmission: Secondary | ICD-10-CM | POA: Insufficient documentation

## 2020-02-06 DIAGNOSIS — R300 Dysuria: Secondary | ICD-10-CM | POA: Insufficient documentation

## 2020-02-06 LAB — POCT URINALYSIS DIP (DEVICE)
Bilirubin Urine: NEGATIVE
Glucose, UA: NEGATIVE mg/dL
Hgb urine dipstick: NEGATIVE
Ketones, ur: NEGATIVE mg/dL
Nitrite: NEGATIVE
Protein, ur: NEGATIVE mg/dL
Specific Gravity, Urine: 1.025 (ref 1.005–1.030)
Urobilinogen, UA: 0.2 mg/dL (ref 0.0–1.0)
pH: 6.5 (ref 5.0–8.0)

## 2020-02-06 MED ORDER — DOXYCYCLINE HYCLATE 100 MG PO CAPS: 100.0000 mg | ORAL_CAPSULE | Freq: Two times a day (BID) | ORAL | 0 refills | Status: DC

## 2020-02-06 MED ORDER — CEFTRIAXONE SODIUM 500 MG IJ SOLR
INTRAMUSCULAR | Status: AC
  Filled 2020-02-06: qty 500

## 2020-02-06 MED ORDER — CEFTRIAXONE SODIUM 500 MG IJ SOLR
500.0000 mg | Freq: Once | INTRAMUSCULAR | Status: AC
  Administered 2020-02-06: 500 mg via INTRAMUSCULAR

## 2020-02-06 NOTE — ED Provider Notes (Signed)
[facility]    CSN: [id number] Arrival date & time: 02/06/20  0857      History   Chief Complaint Chief Complaint  Patient presents with  . Hematuria  . Abdominal Pain    HPI Jaime Mendoza is a 29 y.o. female.   HPI   Patient is here for dysuria and hematuria.  She states is been present since yesterday.  Her pain is in the suprapubic region.  Crampy.  Intermittent.  No fever or chills.  No nausea or vomiting.  Mild vaginal discharge.  No itching or discomfort.  No odor.  Patient has concern of STD.  She states that her current partner has given her GC and trichomonas within the last month.  She states that she was with them again this week, and feels like she may have an STD again.  She is adamant that she wishes STD treatment before she leaves.  No flank pain.  Past Medical History:  Diagnosis Date  . Anxiety   . Asthma   . Chlamydia   . Depression   . HSV-2 infection complicating pregnancy   . Preterm labor   . SVD (spontaneous vaginal delivery) 01/30/2012    Patient Active Problem List   Diagnosis Date Noted  . Severe recurrent major depression without psychotic features (HCC) 10/30/2018  . S/P cesarean section and BTS  09/24/2016  . History of PCR DNA positive for HSV2 09/18/2016  . History of premature delivery, currently pregnant 07/05/2016  . Supervision of normal intrauterine pregnancy in multigravida in first trimester 03/08/2016  . MDD (major depressive disorder), single episode 11/08/2014    Past Surgical History:  Procedure Laterality Date  . CESAREAN SECTION N/A 09/24/2016   Procedure: CESAREAN SECTION;  Surgeon: Tereso Newcomer, MD;  Location: [facility];  Service: Obstetrics;  Laterality: N/A;  . NO PAST SURGERIES      OB History    Gravida  7   Para  5   Term  4   Preterm  1   AB  2   Living  5     SAB  2   TAB  0   Ectopic  0   Multiple  0   Live Births  5            Home Medications    Prior to  Admission medications   Medication Sig Start Date End Date Taking? Authorizing Provider  doxycycline (VIBRAMYCIN) 100 MG capsule Take 1 capsule (100 mg total) by mouth 2 (two) times daily. 02/06/20   Eustace Moore, MD  ibuprofen (ADVIL) 800 MG tablet Take 1 tablet (800 mg total) by mouth 3 (three) times daily. 07/28/19   Roxy Horseman, PA-C  OLANZapine (ZYPREXA) 5 MG tablet Take 1 tablet (5 mg total) by mouth at bedtime. For mood control Patient not taking: Reported on 02/06/2020 11/01/18 02/06/20  Money, Gerlene Burdock, FNP  sertraline (ZOLOFT) 50 MG tablet Take 1 tablet (50 mg total) by mouth daily. For mood control Patient not taking: Reported on 02/06/2020 11/02/18 02/06/20  Money, Gerlene Burdock, FNP  traZODone (DESYREL) 50 MG tablet Take 1 tablet (50 mg total) by mouth at bedtime as needed for sleep. Patient not taking: Reported on 02/06/2020 11/01/18 02/06/20  Money, Gerlene Burdock, FNP    Family History Family History  Problem Relation Age of Onset  . Cancer Father        colon  . Seizures Sister   . Diabetes Maternal Grandmother   .  Heart disease Maternal Grandfather   . Heart disease Paternal Grandfather   . Anesthesia problems Neg Hx   . Hypotension Neg Hx   . Malignant hyperthermia Neg Hx   . Pseudochol deficiency Neg Hx     Social History Social History   Tobacco Use  . Smoking status: Current Every Day Smoker    Packs/day: 0.25    Years: 5.00    Pack years: 1.25    Types: Cigarettes  . Smokeless tobacco: Never Used  Substance Use Topics  . Alcohol use: Yes    Comment: socially  . Drug use: Yes    Types: Cocaine, Marijuana    Comment: Positive for Cocaine, THC, Amphetaines      Allergies   Patient has no known allergies.   Review of Systems Review of Systems  Constitutional: Negative for chills and fever.  Gastrointestinal: Negative for nausea and vomiting.  Genitourinary: Positive for dysuria, hematuria, pelvic pain and vaginal discharge. Negative for dyspareunia, flank  pain, frequency, genital sores and menstrual problem.     Physical Exam Triage Vital Signs ED Triage Vitals [02/06/20 0911]  Enc Vitals Group     BP 115/74     Pulse Rate 70     Resp 18     Temp 98.5 F (36.9 C)     Temp Source Oral     SpO2 99 %     Weight      Height      Head Circumference      Peak Flow      Pain Score 3     Pain Loc      Pain Edu?      Excl. in GC?    No data found.  Updated Vital Signs BP 115/74 (BP Location: Right Arm)   Pulse 70   Temp 98.5 F (36.9 C) (Oral)   Resp 18   SpO2 99%     Physical Exam Constitutional:      General: She is not in acute distress.    Appearance: She is well-developed.  HENT:     Head: Normocephalic and atraumatic.     Mouth/Throat:     Comments: Mask in place Eyes:     Conjunctiva/sclera: Conjunctivae normal.     Pupils: Pupils are equal, round, and reactive to light.  Cardiovascular:     Rate and Rhythm: Normal rate.  Pulmonary:     Effort: Pulmonary effort is normal. No respiratory distress.  Abdominal:     General: There is no distension.     Palpations: Abdomen is soft.     Tenderness: There is no abdominal tenderness. There is no right CVA tenderness or left CVA tenderness.  Musculoskeletal:        General: Normal range of motion.     Cervical back: Normal range of motion.  Skin:    General: Skin is warm and dry.  Neurological:     Mental Status: She is alert.  Psychiatric:        Mood and Affect: Mood normal.        Behavior: Behavior normal.      UC Treatments / Results  Labs (all labs ordered are listed, but only abnormal results are displayed) Labs Reviewed  POCT URINALYSIS DIP (DEVICE) - Abnormal; Notable for the following components:      Result Value   Leukocytes,Ua SMALL (*)    All other components within normal limits  URINE CULTURE  CERVICOVAGINAL ANCILLARY ONLY    EKG  Radiology No results found.  Procedures Procedures (including critical care time)  Medications  Ordered in UC Medications  cefTRIAXone (ROCEPHIN) injection 500 mg (500 mg Intramuscular Given 02/06/20 1014)    Initial Impression / Assessment and Plan / UC Course  I have reviewed the triage vital signs and the nursing notes.  Pertinent labs & imaging results that were available during my care of the patient were reviewed by me and considered in my medical decision making (see chart for details).    Will treat patient for STD with Rocephin and doxycycline.  If she has trichomonas she understands that she would need a prescription for metronidazole.  Urinalysis is less suspicious for UTI, but urine culture is pending.  Safe sex is discussed, recommended Final Clinical Impressions(s) / UC Diagnoses   Final diagnoses:  Possible exposure to STD  Dysuria     Discharge Instructions     Take the doxycycline 2 x a day for 7 days We will call if the culture is positive Avoid sexual relations for the 7 days of antibiotics  You can check for test results on MyChart     ED Prescriptions    Medication Sig Dispense Auth. Provider   doxycycline (VIBRAMYCIN) 100 MG capsule Take 1 capsule (100 mg total) by mouth 2 (two) times daily. 14 capsule Raylene Everts, MD     PDMP not reviewed this encounter.   Raylene Everts, MD 02/06/20 1049

## 2020-02-06 NOTE — Discharge Instructions (Addendum)
Take the doxycycline 2 x a day for 7 days We will call if the culture is positive Avoid sexual relations for the 7 days of antibiotics  You can check for test results on MyChart

## 2020-02-06 NOTE — ED Triage Notes (Signed)
Pt here for abd pain worse when using bathroom and noticed some hematuria yesterday

## 2020-02-07 LAB — CERVICOVAGINAL ANCILLARY ONLY
Chlamydia: POSITIVE — AB
Neisseria Gonorrhea: POSITIVE — AB
Trichomonas: NEGATIVE

## 2020-02-07 LAB — URINE CULTURE: Culture: 6000 — AB

## 2020-02-09 ENCOUNTER — Telehealth (HOSPITAL_COMMUNITY): Payer: Self-pay | Admitting: Emergency Medicine

## 2020-02-09 NOTE — Telephone Encounter (Signed)
Test for gonorrhea was positive. This was treated at the urgent care visit with IM rocephin 500mg. Please refrain from sexual intercourse for 7 days after treatment to give the medicine time to work. Sexual partners need to be notified and tested/treated. Condoms may reduce risk of reinfection. Recheck or followup with PCP for further evaluation if symptoms are not improving. [facility] notified.   Chlamydia is positive.  This was treated at the urgent care visit with prescription doxy.  Please refrain from sexual intercourse for 7 days to give the medicine time to work.  Sexual partners need to be notified and tested/treated.  Condoms may reduce risk of reinfection.  Recheck or followup with PCP for further evaluation if symptoms are not improving.  [facility] notified.  Patient contacted by phone and made aware of    results. Pt verbalized understanding and had all questions answered.     

## 2020-06-04 ENCOUNTER — Ambulatory Visit: Payer: Self-pay | Admitting: Family Medicine

## 2020-06-09 NOTE — Progress Notes (Signed)
No show

## 2020-06-10 ENCOUNTER — Ambulatory Visit (INDEPENDENT_AMBULATORY_CARE_PROVIDER_SITE_OTHER): Payer: Self-pay | Admitting: Family Medicine

## 2020-06-10 DIAGNOSIS — Z5329 Procedure and treatment not carried out because of patient's decision for other reasons: Secondary | ICD-10-CM

## 2020-07-30 ENCOUNTER — Ambulatory Visit (HOSPITAL_COMMUNITY)
Admission: EM | Admit: 2020-07-30 | Discharge: 2020-07-30 | Disposition: A | Discharge status: Home / Self Care | Payer: Self-pay

## 2020-07-30 ENCOUNTER — Other Ambulatory Visit: Payer: Self-pay

## 2020-07-31 ENCOUNTER — Encounter (HOSPITAL_COMMUNITY): Payer: Self-pay | Admitting: Urgent Care

## 2020-07-31 ENCOUNTER — Ambulatory Visit (HOSPITAL_COMMUNITY)
Admission: EM | Admit: 2020-07-31 | Discharge: 2020-07-31 | Disposition: A | Discharge status: Home / Self Care | Payer: Self-pay | Attending: Urgent Care | Admitting: Urgent Care

## 2020-07-31 ENCOUNTER — Other Ambulatory Visit: Payer: Self-pay

## 2020-07-31 DIAGNOSIS — R109 Unspecified abdominal pain: Secondary | ICD-10-CM

## 2020-07-31 DIAGNOSIS — Z202 Contact with and (suspected) exposure to infections with a predominantly sexual mode of transmission: Secondary | ICD-10-CM

## 2020-07-31 DIAGNOSIS — R1030 Lower abdominal pain, unspecified: Secondary | ICD-10-CM

## 2020-07-31 DIAGNOSIS — Z3202 Encounter for pregnancy test, result negative: Secondary | ICD-10-CM

## 2020-07-31 DIAGNOSIS — Z8619 Personal history of other infectious and parasitic diseases: Secondary | ICD-10-CM

## 2020-07-31 LAB — POCT URINALYSIS DIPSTICK, ED / UC
Bilirubin Urine: NEGATIVE
Glucose, UA: NEGATIVE mg/dL
Ketones, ur: NEGATIVE mg/dL
Leukocytes,Ua: NEGATIVE
Nitrite: NEGATIVE
Protein, ur: NEGATIVE mg/dL
Specific Gravity, Urine: >=1.03 (ref 1.005–1.030)
Urobilinogen, UA: 0.2 mg/dL (ref 0.0–1.0)
pH: 5.5 (ref 5.0–8.0)

## 2020-07-31 LAB — POC URINE PREG, ED: Preg Test, Ur: NEGATIVE

## 2020-07-31 LAB — HIV ANTIBODY (ROUTINE TESTING W REFLEX): HIV Screen 4th Generation wRfx: NONREACTIVE

## 2020-07-31 MED ORDER — ONDANSETRON 8 MG PO TBDP: 8.0000 mg | ORAL_TABLET | Freq: Three times a day (TID) | ORAL | 0 refills | Status: DC | PRN

## 2020-07-31 MED ORDER — DOXYCYCLINE HYCLATE 100 MG PO CAPS: 100.0000 mg | ORAL_CAPSULE | Freq: Two times a day (BID) | ORAL | 0 refills | Status: DC

## 2020-07-31 MED ORDER — LIDOCAINE HCL (PF) 1 % IJ SOLN
INTRAMUSCULAR | Status: AC
  Filled 2020-07-31: qty 2

## 2020-07-31 MED ORDER — CEFTRIAXONE SODIUM 500 MG IJ SOLR
INTRAMUSCULAR | Status: AC
  Filled 2020-07-31: qty 500

## 2020-07-31 MED ORDER — CEFTRIAXONE SODIUM 500 MG IJ SOLR
500.0000 mg | Freq: Once | INTRAMUSCULAR | Status: AC
  Administered 2020-07-31: 500 mg via INTRAMUSCULAR

## 2020-07-31 MED ORDER — NAPROXEN 500 MG PO TABS: 500.0000 mg | ORAL_TABLET | Freq: Two times a day (BID) | ORAL | 0 refills | Status: DC

## 2020-07-31 NOTE — ED Provider Notes (Addendum)
[facility]   MRN: [id number] DOB: 1991/04/07  Subjective:   Jaime Mendoza is a 29 y.o. female presenting for possible exposure to an STI.  Patient states that one of her sex partner called and told her that she needs to get checked.  She would like to have blood work done and a full STI check.  She has had some right lower belly pain.  Has a history of gonorrhea, chlamydia tested positive back in March.  Patient does not tolerate azithromycin very well and prefers doxycycline.  No current facility-administered medications for this encounter.  Current Outpatient Medications:  .  doxycycline (VIBRAMYCIN) 100 MG capsule, Take 1 capsule (100 mg total) by mouth 2 (two) times daily., Disp: 14 capsule, Rfl: 0 .  ibuprofen (ADVIL) 800 MG tablet, Take 1 tablet (800 mg total) by mouth 3 (three) times daily., Disp: 21 tablet, Rfl: 0   No Known Allergies  Past Medical History:  Diagnosis Date  . Anxiety   . Asthma   . Chlamydia   . Depression   . HSV-2 infection complicating pregnancy   . Preterm labor   . SVD (spontaneous vaginal delivery) 01/30/2012     Past Surgical History:  Procedure Laterality Date  . CESAREAN SECTION N/A 09/24/2016   Procedure: CESAREAN SECTION;  Surgeon: Tereso Newcomer, MD;  Location: [facility];  Service: Obstetrics;  Laterality: N/A;  . NO PAST SURGERIES      Family History  Problem Relation Age of Onset  . Cancer Father        colon  . Seizures Sister   . Diabetes Maternal Grandmother   . Heart disease Maternal Grandfather   . Heart disease Paternal Grandfather   . Anesthesia problems Neg Hx   . Hypotension Neg Hx   . Malignant hyperthermia Neg Hx   . Pseudochol deficiency Neg Hx     Social History   Tobacco Use  . Smoking status: Current Every Day Smoker    Packs/day: 0.25    Years: 5.00    Pack years: 1.25    Types: Cigarettes  . Smokeless tobacco: Never Used  Substance Use Topics  . Alcohol use: Yes    Comment:  socially  . Drug use: Yes    Types: Cocaine, Marijuana    Comment: Positive for Cocaine, THC, Amphetaines     ROS   Objective:   Vitals: BP 128/88   Pulse 89   Temp 98.6 F (37 C)   Resp 14   LMP 06/28/2020 (Within Days)   SpO2 100%   Physical Exam Constitutional:      General: She is not in acute distress.    Appearance: Normal appearance. She is well-developed. She is not ill-appearing, toxic-appearing or diaphoretic.  HENT:     Head: Normocephalic and atraumatic.     Nose: Nose normal.     Mouth/Throat:     Mouth: Mucous membranes are moist.     Pharynx: Oropharynx is clear.  Eyes:     General: No scleral icterus.       Right eye: No discharge.        Left eye: No discharge.     Extraocular Movements: Extraocular movements intact.     Conjunctiva/sclera: Conjunctivae normal.     Pupils: Pupils are equal, round, and reactive to light.  Cardiovascular:     Rate and Rhythm: Normal rate.  Pulmonary:     Effort: Pulmonary effort is normal.  Skin:    General: Skin  is warm and dry.  Neurological:     General: No focal deficit present.     Mental Status: She is alert and oriented to person, place, and time.  Psychiatric:        Mood and Affect: Mood normal.        Behavior: Behavior normal.        Thought Content: Thought content normal.        Judgment: Judgment normal.    Results for orders placed or performed during the hospital encounter of 07/31/20 (from the past 24 hour(s))  POC Urinalysis dipstick     Status: Abnormal   Collection Time: 07/31/20 10:32 AM  Result Value Ref Range   Glucose, UA NEGATIVE NEGATIVE mg/dL   Bilirubin Urine NEGATIVE NEGATIVE   Ketones, ur NEGATIVE NEGATIVE mg/dL   Specific Gravity, Urine >=1.030 1.005 - 1.030   Hgb urine dipstick TRACE (A) NEGATIVE   pH 5.5 5.0 - 8.0   Protein, ur NEGATIVE NEGATIVE mg/dL   Urobilinogen, UA 0.2 0.0 - 1.0 mg/dL   Nitrite NEGATIVE NEGATIVE   Leukocytes,Ua NEGATIVE NEGATIVE  POC urine  pregnancy     Status: None   Collection Time: 07/31/20 10:35 AM  Result Value Ref Range   Preg Test, Ur NEGATIVE NEGATIVE     Assessment and Plan :   PDMP not reviewed this encounter.  1. Exposure to STD   2. Lower abdominal pain   3. History of gonorrhea   4. History of chlamydia     Patient treated empirically as per CDC guidelines with IM ceftriaxone, doxycycline as an outpatient.  Labs pending.   Counseled on safe sex practices including abstaining for 1 week following treatment.  Use naproxen for pain and inflammation.  Counseled patient on potential for adverse effects with medications prescribed/recommended today, ER and return-to-clinic precautions discussed, patient verbalized understanding.   Wallis Bamberg, PA-C 07/31/20 1046

## 2020-07-31 NOTE — Discharge Instructions (Signed)
Avoid all forms of sexual intercourse (oral, vaginal, anal) for the next 7 days to avoid spreading/reinfecting. Return if symptoms worsen/do not resolve, you develop fever, abdominal pain, blood in your urine, or are re-exposed to an STI.  

## 2020-07-31 NOTE — ED Triage Notes (Signed)
Patient reports right lower abdominal pain and patient reports she received a phone call from someone last night that her partner may have given them an STI. Patient requesting swab and blood testing.

## 2020-08-01 LAB — RPR: RPR Ser Ql: NONREACTIVE

## 2020-08-03 ENCOUNTER — Telehealth (HOSPITAL_COMMUNITY): Payer: Self-pay | Admitting: Emergency Medicine

## 2020-08-03 LAB — CERVICOVAGINAL ANCILLARY ONLY
Bacterial Vaginitis (gardnerella): POSITIVE — AB
Candida Glabrata: NEGATIVE
Candida Vaginitis: POSITIVE — AB
Chlamydia: NEGATIVE
Comment: NEGATIVE
Comment: NEGATIVE
Comment: NEGATIVE
Comment: NEGATIVE
Comment: NEGATIVE
Comment: NORMAL
Neisseria Gonorrhea: NEGATIVE
Trichomonas: POSITIVE — AB

## 2020-08-03 MED ORDER — METRONIDAZOLE 500 MG PO TABS: 500.0000 mg | ORAL_TABLET | Freq: Two times a day (BID) | ORAL | 0 refills | Status: DC

## 2020-08-03 MED ORDER — FLUCONAZOLE 150 MG PO TABS: 150.0000 mg | ORAL_TABLET | Freq: Once | ORAL | 0 refills | Status: AC

## 2021-02-18 ENCOUNTER — Ambulatory Visit (HOSPITAL_COMMUNITY)
Admission: EM | Admit: 2021-02-18 | Discharge: 2021-02-18 | Disposition: A | Discharge status: Home / Self Care | Payer: Self-pay | Attending: Medical Oncology | Admitting: Medical Oncology

## 2021-02-18 ENCOUNTER — Other Ambulatory Visit: Payer: Self-pay

## 2021-02-18 ENCOUNTER — Encounter (HOSPITAL_COMMUNITY): Payer: Self-pay | Admitting: *Deleted

## 2021-02-18 DIAGNOSIS — Z8249 Family history of ischemic heart disease and other diseases of the circulatory system: Secondary | ICD-10-CM

## 2021-02-18 DIAGNOSIS — N898 Other specified noninflammatory disorders of vagina: Secondary | ICD-10-CM | POA: Insufficient documentation

## 2021-02-18 DIAGNOSIS — N390 Urinary tract infection, site not specified: Secondary | ICD-10-CM

## 2021-02-18 DIAGNOSIS — Z3202 Encounter for pregnancy test, result negative: Secondary | ICD-10-CM

## 2021-02-18 DIAGNOSIS — R079 Chest pain, unspecified: Secondary | ICD-10-CM

## 2021-02-18 DIAGNOSIS — F1029 Alcohol dependence with unspecified alcohol-induced disorder: Secondary | ICD-10-CM | POA: Diagnosis present

## 2021-02-18 DIAGNOSIS — R0789 Other chest pain: Secondary | ICD-10-CM | POA: Insufficient documentation

## 2021-02-18 LAB — POCT URINALYSIS DIPSTICK, ED / UC
Bilirubin Urine: NEGATIVE
Glucose, UA: NEGATIVE mg/dL
Hgb urine dipstick: NEGATIVE
Ketones, ur: NEGATIVE mg/dL
Leukocytes,Ua: NEGATIVE
Nitrite: NEGATIVE
Protein, ur: NEGATIVE mg/dL
Specific Gravity, Urine: >=1.03 (ref 1.005–1.030)
Urobilinogen, UA: 1 mg/dL (ref 0.0–1.0)
pH: 5.5 (ref 5.0–8.0)

## 2021-02-18 LAB — POC URINE PREG, ED: Preg Test, Ur: NEGATIVE

## 2021-02-18 NOTE — ED Provider Notes (Signed)
[facility]    CSN: [id number] Arrival date & time: 02/18/21  3825      History   Chief Complaint Chief Complaint  Patient presents with  . Urinary Tract Infection  . Chest Pain    HPI Jaime Mendoza is a 30 y.o. female.   HPI  Brown Vaginal Discharge: Patient states that for the past few days she has had a mild slightly brown vaginal discharge.  She has a history of multiple STIs per patient.  She states that she would like vaginal STI screening today.  No dysuria, urinary frequency or pelvic pain.  Her last menstrual cycle was in 1 month.   Chest Pain: Pt reports that she has had chest pain off and on for months. Chest pain described as burning sensation in her mid chest. Does not occur with activity.  Worse when she eats and goes to bed.  No current chest pain. Pt is a current smoker, heavy EtOH use (1 gallon of tequila daily) and marijuana drug user.  She denies cocaine use today but in her chart it does state a history of cocaine use.  She has had multiple family members have MIs before age 87 including a maternal cousin, a brother and multiple grandparents.  No dizziness, nausea, sweating with episodes.  Past Medical History:  Diagnosis Date  . Anxiety   . Asthma   . Chlamydia   . Depression   . HSV-2 infection complicating pregnancy   . Preterm labor   . SVD (spontaneous vaginal delivery) 01/30/2012    Patient Active Problem List   Diagnosis Date Noted  . Severe recurrent major depression without psychotic features (HCC) 10/30/2018  . S/P cesarean section and BTS  09/24/2016  . History of PCR DNA positive for HSV2 09/18/2016  . History of premature delivery, currently pregnant 07/05/2016  . Supervision of normal intrauterine pregnancy in multigravida in first trimester 03/08/2016  . MDD (major depressive disorder), single episode 11/08/2014    Past Surgical History:  Procedure Laterality Date  . CESAREAN SECTION N/A 09/24/2016   Procedure: CESAREAN  SECTION;  Surgeon: Tereso Newcomer, MD;  Location: [facility];  Service: Obstetrics;  Laterality: N/A;  . NO PAST SURGERIES      OB History    Gravida  7   Para  5   Term  4   Preterm  1   AB  2   Living  5     SAB  2   IAB  0   Ectopic  0   Multiple  0   Live Births  5            Home Medications    Prior to Admission medications   Medication Sig Start Date End Date Taking? Authorizing Provider  doxycycline (VIBRAMYCIN) 100 MG capsule Take 1 capsule (100 mg total) by mouth 2 (two) times daily. 07/31/20   Wallis Bamberg, PA-C  ibuprofen (ADVIL) 800 MG tablet Take 1 tablet (800 mg total) by mouth 3 (three) times daily. 07/28/19   Roxy Horseman, PA-C  metroNIDAZOLE (FLAGYL) 500 MG tablet Take 1 tablet (500 mg total) by mouth 2 (two) times daily. 08/03/20   Merrilee Jansky, MD  naproxen (NAPROSYN) 500 MG tablet Take 1 tablet (500 mg total) by mouth 2 (two) times daily with a meal. 07/31/20   Wallis Bamberg, PA-C  ondansetron (ZOFRAN-ODT) 8 MG disintegrating tablet Take 1 tablet (8 mg total) by mouth every 8 (eight) hours as needed  for nausea or vomiting. 07/31/20   Wallis Bamberg, PA-C  OLANZapine (ZYPREXA) 5 MG tablet Take 1 tablet (5 mg total) by mouth at bedtime. For mood control Patient not taking: Reported on 02/06/2020 11/01/18 02/06/20  Money, Gerlene Burdock, FNP  sertraline (ZOLOFT) 50 MG tablet Take 1 tablet (50 mg total) by mouth daily. For mood control Patient not taking: Reported on 02/06/2020 11/02/18 02/06/20  Money, Gerlene Burdock, FNP  traZODone (DESYREL) 50 MG tablet Take 1 tablet (50 mg total) by mouth at bedtime as needed for sleep. Patient not taking: Reported on 02/06/2020 11/01/18 02/06/20  Money, Gerlene Burdock, FNP    Family History Family History  Problem Relation Age of Onset  . Cancer Father        colon  . Seizures Sister   . Diabetes Maternal Grandmother   . Heart disease Maternal Grandfather   . Heart disease Paternal Grandfather   . Anesthesia problems Neg Hx    . Hypotension Neg Hx   . Malignant hyperthermia Neg Hx   . Pseudochol deficiency Neg Hx     Social History Social History   Tobacco Use  . Smoking status: Current Every Day Smoker    Packs/day: 0.25    Years: 5.00    Pack years: 1.25    Types: Cigarettes  . Smokeless tobacco: Never Used  Substance Use Topics  . Alcohol use: Yes    Comment: socially  . Drug use: Yes    Types: Cocaine, Marijuana    Comment: Positive for Cocaine, THC, Amphetaines      Allergies   Percocet [oxycodone-acetaminophen]   Review of Systems Review of Systems  As stated above in HPI Physical Exam Triage Vital Signs  No data found.  Updated Vital Signs Pulse 91   Temp 98.8 F (37.1 C) (Oral)   Resp 16   LMP 02/02/2021   Physical Exam Vitals and nursing note reviewed.  Constitutional:      General: She is not in acute distress.    Appearance: She is not ill-appearing, toxic-appearing or diaphoretic.  HENT:     Head: Normocephalic and atraumatic.  Eyes:     Comments: No pallor of conjunctiva  Neck:     Vascular: No JVD.  Cardiovascular:     Rate and Rhythm: Normal rate and regular rhythm.     Heart sounds: Normal heart sounds.     Comments: No carotid bruits auscultated bilaterally Pulmonary:     Effort: Pulmonary effort is normal.     Breath sounds: Normal breath sounds.  Chest:     Chest wall: No mass, deformity, tenderness, crepitus or edema. There is no dullness to percussion.  Abdominal:     Palpations: Abdomen is soft. There is hepatomegaly.  Musculoskeletal:     Cervical back: Neck supple.     Right lower leg: No tenderness. No edema.     Left lower leg: No tenderness. No edema.  Skin:    General: Skin is warm.     Comments: No jaundice  Neurological:     General: No focal deficit present.     Mental Status: She is alert.  Psychiatric:        Mood and Affect: Mood normal.      UC Treatments / Results  Labs (all labs ordered are listed, but only abnormal  results are displayed) Labs Reviewed  URINE CULTURE  POC URINE PREG, ED  POCT URINALYSIS DIPSTICK, ED / UC  CERVICOVAGINAL ANCILLARY ONLY    EKG  Radiology No results found.  Procedures Procedures (including critical care time)  Medications Ordered in UC Medications - No data to display  Initial Impression / Assessment and Plan / UC Course  I have reviewed the triage vital signs and the nursing notes.  Pertinent labs & imaging results that were available during my care of the patient were reviewed by me and considered in my medical decision making (see chart for details).     New.  Screening for STI per patient request.  Her symptoms likely represent GERD however with her family history and social history including heavy EtOH use and potential cocaine use she does need to be evaluated by a cardiologist which I discussed with her.  Normally I would recommend a smoker with similar symptoms start an 81 mg aspirin however with her significant use of EtOH this is likely to be more likely to give her a GI bleed then benefit.  For this reason I am not starting her on an 81 mg aspirin.  Discussed slow reduction in EtOH use, reduction in tobacco use/cessation.  Discussed red flag signs and symptoms.   I spent 45 minutes dedicated to the care of this patient (face to face and non-face to face) on the date of this encounter to include the conversation and items above.  Final Clinical Impressions(s) / UC Diagnoses   Final diagnoses:  None   Discharge Instructions   None    ED Prescriptions    None     PDMP not reviewed this encounter.   Rushie Chestnut, [place] 02/18/21 [phone]

## 2021-02-18 NOTE — ED Triage Notes (Signed)
Pt reports sharp CP that is off and on . CP not related to any activity. Pt also reported seeing brown on tissue after voiding. Pt denies CP at this time.

## 2021-02-19 LAB — URINE CULTURE: Culture: NO GROWTH

## 2021-02-21 LAB — CERVICOVAGINAL ANCILLARY ONLY
Bacterial Vaginitis (gardnerella): POSITIVE — AB
Candida Glabrata: NEGATIVE
Candida Vaginitis: POSITIVE — AB
Chlamydia: NEGATIVE
Comment: NEGATIVE
Comment: NEGATIVE
Comment: NEGATIVE
Comment: NEGATIVE
Comment: NEGATIVE
Comment: NORMAL
Neisseria Gonorrhea: NEGATIVE
Trichomonas: NEGATIVE

## 2021-02-22 ENCOUNTER — Telehealth (HOSPITAL_COMMUNITY): Payer: Self-pay | Admitting: Emergency Medicine

## 2021-02-22 MED ORDER — METRONIDAZOLE 500 MG PO TABS: 500.0000 mg | ORAL_TABLET | Freq: Two times a day (BID) | ORAL | 0 refills | Status: DC

## 2021-02-22 MED ORDER — FLUCONAZOLE 150 MG PO TABS: 150.0000 mg | ORAL_TABLET | Freq: Once | ORAL | 0 refills | Status: AC

## 2021-12-05 ENCOUNTER — Other Ambulatory Visit: Payer: Self-pay

## 2021-12-05 ENCOUNTER — Encounter (HOSPITAL_COMMUNITY): Payer: Self-pay

## 2021-12-05 ENCOUNTER — Ambulatory Visit (HOSPITAL_COMMUNITY)
Admission: EM | Admit: 2021-12-05 | Discharge: 2021-12-05 | Disposition: A | Discharge status: Home / Self Care | Payer: Self-pay | Attending: Internal Medicine | Admitting: Internal Medicine

## 2021-12-05 DIAGNOSIS — Z113 Encounter for screening for infections with a predominantly sexual mode of transmission: Secondary | ICD-10-CM | POA: Insufficient documentation

## 2021-12-05 DIAGNOSIS — Z202 Contact with and (suspected) exposure to infections with a predominantly sexual mode of transmission: Secondary | ICD-10-CM

## 2021-12-05 LAB — HIV ANTIBODY (ROUTINE TESTING W REFLEX): HIV Screen 4th Generation wRfx: NONREACTIVE

## 2021-12-05 LAB — POC URINE PREG, ED: Preg Test, Ur: NEGATIVE

## 2021-12-05 NOTE — ED Triage Notes (Signed)
Pt presents with complaints of needing full panel of std testing done. Pr states body feels off like aching in her lower abdomen. Reports mild vaginal itching.

## 2021-12-05 NOTE — Discharge Instructions (Signed)
We will call you with recommendations if labs abnormal Safe sex practices advised
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
## 2021-12-06 LAB — CERVICOVAGINAL ANCILLARY ONLY
Bacterial Vaginitis (gardnerella): POSITIVE — AB
Candida Glabrata: NEGATIVE
Candida Vaginitis: NEGATIVE
Chlamydia: NEGATIVE
Comment: NEGATIVE
Comment: NEGATIVE
Comment: NEGATIVE
Comment: NEGATIVE
Comment: NEGATIVE
Comment: NORMAL
Neisseria Gonorrhea: POSITIVE — AB
Trichomonas: POSITIVE — AB

## 2021-12-06 LAB — RPR: RPR Ser Ql: NONREACTIVE

## 2021-12-08 ENCOUNTER — Other Ambulatory Visit: Payer: Self-pay

## 2021-12-08 ENCOUNTER — Ambulatory Visit (HOSPITAL_COMMUNITY)
Admission: EM | Admit: 2021-12-08 | Discharge: 2021-12-08 | Disposition: A | Discharge status: Home / Self Care | Payer: Self-pay | Attending: Emergency Medicine | Admitting: Emergency Medicine

## 2021-12-08 ENCOUNTER — Telehealth (HOSPITAL_COMMUNITY): Payer: Self-pay | Admitting: Emergency Medicine

## 2021-12-08 DIAGNOSIS — A549 Gonococcal infection, unspecified: Secondary | ICD-10-CM

## 2021-12-08 MED ORDER — CEFTRIAXONE SODIUM 500 MG IJ SOLR
500.0000 mg | Freq: Once | INTRAMUSCULAR | Status: AC
  Administered 2021-12-08: 500 mg via INTRAMUSCULAR

## 2021-12-08 MED ORDER — CEFTRIAXONE SODIUM 500 MG IJ SOLR
INTRAMUSCULAR | Status: AC
  Filled 2021-12-08: qty 500

## 2021-12-08 MED ORDER — METRONIDAZOLE 500 MG PO TABS: 500.0000 mg | ORAL_TABLET | Freq: Two times a day (BID) | ORAL | 0 refills | Status: DC

## 2021-12-08 MED ORDER — LIDOCAINE HCL (PF) 1 % IJ SOLN
INTRAMUSCULAR | Status: AC
  Filled 2021-12-08: qty 2

## 2021-12-08 NOTE — ED Triage Notes (Signed)
Pt presents for STD treatment. Denies concerns. 

## 2022-03-02 ENCOUNTER — Other Ambulatory Visit: Payer: Self-pay

## 2022-03-02 ENCOUNTER — Ambulatory Visit (HOSPITAL_COMMUNITY)
Admission: EM | Admit: 2022-03-02 | Discharge: 2022-03-02 | Disposition: A | Discharge status: Home / Self Care | Payer: Self-pay | Attending: Physician Assistant | Admitting: Physician Assistant

## 2022-03-02 ENCOUNTER — Encounter (HOSPITAL_COMMUNITY): Payer: Self-pay | Admitting: Emergency Medicine

## 2022-03-02 DIAGNOSIS — Z202 Contact with and (suspected) exposure to infections with a predominantly sexual mode of transmission: Secondary | ICD-10-CM | POA: Insufficient documentation

## 2022-03-02 DIAGNOSIS — M79604 Pain in right leg: Secondary | ICD-10-CM | POA: Insufficient documentation

## 2022-03-02 DIAGNOSIS — Z113 Encounter for screening for infections with a predominantly sexual mode of transmission: Secondary | ICD-10-CM | POA: Insufficient documentation

## 2022-03-02 MED ORDER — CYCLOBENZAPRINE HCL 5 MG PO TABS: 5.0000 mg | ORAL_TABLET | Freq: Two times a day (BID) | ORAL | 0 refills | Status: DC | PRN

## 2022-03-02 MED ORDER — CEFTRIAXONE SODIUM 500 MG IJ SOLR
500.0000 mg | Freq: Once | INTRAMUSCULAR | Status: AC
  Administered 2022-03-02: 500 mg via INTRAMUSCULAR

## 2022-03-02 MED ORDER — METRONIDAZOLE 500 MG PO TABS
500.0000 mg | ORAL_TABLET | Freq: Two times a day (BID) | ORAL | 0 refills | Status: DC
Start: 2022-03-02 — End: 2022-08-07

## 2022-03-02 MED ORDER — CEFTRIAXONE SODIUM 500 MG IJ SOLR
INTRAMUSCULAR | Status: AC
  Filled 2022-03-02: qty 500

## 2022-03-02 NOTE — ED Triage Notes (Addendum)
Pt reports a sharp pain that begins in the pelvic area and radiates all the way to the foot on the right side x 1 week.  States the pain is a numbing sensation.  ?Also requesting STD testing. States the last time she was here (12/05/21) she was tx for Gonorrhea and Trich and believes the medication may not have been effective.  ?

## 2022-03-02 NOTE — Discharge Instructions (Addendum)
Take Flexeril up to twice a day.  This to make you sleepy so do not drive or drink alcohol taking it.  Use heat and stretch for symptom relief.  You can use Tylenol and ibuprofen for symptom relief.  If your symptoms or not improving please follow-up with sports medicine; call to schedule appointment.  If anything worsens and you have numbness on the inside of your leg, trouble controlling your bladder or bowels, weakness, increased pain you need to go to the emergency room. ? ?We have treated you for gonorrhea.  I have sent in the treatment for trichomonas.  Please take metronidazole twice daily for 7 days.  Do not drink any alcohol while taking this medication as it will cause you to vomit.  Abstain from sex until you complete treatment.  We will contact you if your STI swab results requires any additional treatment. ?
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
## 2022-03-03 ENCOUNTER — Telehealth (HOSPITAL_COMMUNITY): Payer: Self-pay | Admitting: Emergency Medicine

## 2022-03-03 LAB — CERVICOVAGINAL ANCILLARY ONLY
Bacterial Vaginitis (gardnerella): POSITIVE — AB
Candida Glabrata: NEGATIVE
Candida Vaginitis: POSITIVE — AB
Chlamydia: NEGATIVE
Comment: NEGATIVE
Comment: NEGATIVE
Comment: NEGATIVE
Comment: NEGATIVE
Comment: NEGATIVE
Comment: NORMAL
Neisseria Gonorrhea: NEGATIVE
Trichomonas: NEGATIVE

## 2022-03-03 MED ORDER — FLUCONAZOLE 150 MG PO TABS: 150.0000 mg | ORAL_TABLET | Freq: Once | ORAL | 0 refills | Status: AC

## 2022-03-16 ENCOUNTER — Other Ambulatory Visit: Payer: Self-pay

## 2022-03-16 ENCOUNTER — Emergency Department (HOSPITAL_COMMUNITY)
Admission: EM | Admit: 2022-03-16 | Discharge: 2022-03-16 | Discharge status: Against Medical Advice | Payer: Self-pay | Attending: Emergency Medicine | Admitting: Emergency Medicine

## 2022-03-16 ENCOUNTER — Encounter (HOSPITAL_COMMUNITY): Payer: Self-pay | Admitting: *Deleted

## 2022-03-16 DIAGNOSIS — Z5321 Procedure and treatment not carried out due to patient leaving prior to being seen by health care provider: Secondary | ICD-10-CM | POA: Insufficient documentation

## 2022-03-16 DIAGNOSIS — R110 Nausea: Secondary | ICD-10-CM | POA: Insufficient documentation

## 2022-03-16 DIAGNOSIS — R1013 Epigastric pain: Secondary | ICD-10-CM | POA: Insufficient documentation

## 2022-03-16 DIAGNOSIS — N939 Abnormal uterine and vaginal bleeding, unspecified: Secondary | ICD-10-CM | POA: Insufficient documentation

## 2022-03-16 LAB — COMPREHENSIVE METABOLIC PANEL
ALT: 12 U/L (ref 0–44)
AST: 20 U/L (ref 15–41)
Albumin: 3.9 g/dL (ref 3.5–5.0)
Alkaline Phosphatase: 42 U/L (ref 38–126)
Anion gap: 7 (ref 5–15)
BUN: 12 mg/dL (ref 6–20)
CO2: 23 mmol/L (ref 22–32)
Calcium: 9 mg/dL (ref 8.9–10.3)
Chloride: 107 mmol/L (ref 98–111)
Creatinine, Ser: 0.86 mg/dL (ref 0.44–1.00)
GFR, Estimated: >60 mL/min (ref >60)
Glucose, Bld: 100 mg/dL — ABNORMAL HIGH (ref 70–99)
Potassium: 3.9 mmol/L (ref 3.5–5.1)
Sodium: 137 mmol/L (ref 135–145)
Total Bilirubin: 0.5 mg/dL (ref 0.3–1.2)
Total Protein: 6.5 g/dL (ref 6.5–8.1)

## 2022-03-16 LAB — CBC WITH DIFFERENTIAL/PLATELET
Abs Immature Granulocytes: 0.01 10*3/uL (ref 0.00–0.07)
Basophils Absolute: 0.1 10*3/uL (ref 0.0–0.1)
Basophils Relative: 1 %
Eosinophils Absolute: 0.3 10*3/uL (ref 0.0–0.5)
Eosinophils Relative: 6 %
HCT: 34.3 % — ABNORMAL LOW (ref 36.0–46.0)
Hemoglobin: 10.6 g/dL — ABNORMAL LOW (ref 12.0–15.0)
Immature Granulocytes: 0 %
Lymphocytes Relative: 34 %
Lymphs Abs: 1.5 10*3/uL (ref 0.7–4.0)
MCH: 27.7 pg (ref 26.0–34.0)
MCHC: 30.9 g/dL (ref 30.0–36.0)
MCV: 89.6 fL (ref 80.0–100.0)
Monocytes Absolute: 0.3 10*3/uL (ref 0.1–1.0)
Monocytes Relative: 7 %
Neutro Abs: 2.4 10*3/uL (ref 1.7–7.7)
Neutrophils Relative %: 52 %
Platelets: 162 10*3/uL (ref 150–400)
RBC: 3.83 MIL/uL — ABNORMAL LOW (ref 3.87–5.11)
RDW: 14.6 % (ref 11.5–15.5)
WBC: 4.6 10*3/uL (ref 4.0–10.5)
nRBC: 0 % (ref 0.0–0.2)

## 2022-03-16 LAB — LIPASE, BLOOD: Lipase: 359 U/L — ABNORMAL HIGH (ref 11–51)

## 2022-03-16 LAB — I-STAT BETA HCG BLOOD, ED (MC, WL, AP ONLY): I-stat hCG, quantitative: <5 m[IU]/mL (ref <5)

## 2022-03-16 NOTE — ED Provider Triage Note (Signed)
Emergency Medicine Provider Triage Evaluation Note ? ?Copeland D Brigitte Pulse , a 31 y.o. female  was evaluated in triage.  Pt complains of abd pain. ? ?Review of Systems  ?Positive: Epigastric pain, nausea ?Negative: Fever, cough, dysuria ? ?Physical Exam  ?BP (!) 135/92 (BP Location: Right Arm)   Pulse 64   Temp 98.6 ?F (37 ?C) (Oral)   Resp 16   SpO2 99%  ?Gen:   Awake, no distress   ?Resp:  Normal effort  ?MSK:   Moves extremities without difficulty  ?Other:   ? ?Medical Decision Making  ?Medically screening exam initiated at 9:18 AM.  Appropriate orders placed.  Leeana D Gauna was informed that the remainder of the evaluation will be completed by another provider, this initial triage assessment does not replace that evaluation, and the importance of remaining in the ED until their evaluation is complete. ? ?Report pain to epigastric region with nausea this AM.  Currently on her menstruation.  ?  ?Domenic Moras, PA-C ?03/16/22 [id number] ? ?

## 2022-03-16 NOTE — ED Notes (Signed)
Pt refused to wait in lobby, states that she is leaving and does not need to be seen. ?

## 2022-03-16 NOTE — ED Notes (Signed)
Called pt name X4 no answer no were to be found. ?

## 2022-03-16 NOTE — ED Triage Notes (Signed)
Pt reports mid abd pain yesterday. Reports being on her menstrual cycle but it was heavier flow than normal. No acute distress is noted at this time.  ?

## 2022-08-07 ENCOUNTER — Encounter (HOSPITAL_COMMUNITY): Payer: Self-pay

## 2022-08-07 ENCOUNTER — Ambulatory Visit (HOSPITAL_COMMUNITY)
Admission: RE | Admit: 2022-08-07 | Discharge: 2022-08-07 | Disposition: A | Discharge status: Home / Self Care | Payer: Self-pay | Source: Ambulatory Visit | Attending: Family Medicine | Admitting: Family Medicine

## 2022-08-07 VITALS — BP 100/65 | HR 88 | Temp 98.7°F | Resp 18

## 2022-08-07 DIAGNOSIS — Z202 Contact with and (suspected) exposure to infections with a predominantly sexual mode of transmission: Secondary | ICD-10-CM | POA: Insufficient documentation

## 2022-08-07 LAB — POCT URINALYSIS DIPSTICK, ED / UC
Bilirubin Urine: NEGATIVE
Glucose, UA: NEGATIVE mg/dL
Ketones, ur: NEGATIVE mg/dL
Leukocytes,Ua: NEGATIVE
Nitrite: NEGATIVE
Protein, ur: NEGATIVE mg/dL
Specific Gravity, Urine: 1.025 (ref 1.005–1.030)
Urobilinogen, UA: 0.2 mg/dL (ref 0.0–1.0)
pH: 6 (ref 5.0–8.0)

## 2022-08-07 LAB — POC URINE PREG, ED: Preg Test, Ur: NEGATIVE

## 2022-08-07 MED ORDER — DOXYCYCLINE HYCLATE 100 MG PO CAPS: 100.0000 mg | ORAL_CAPSULE | Freq: Two times a day (BID) | ORAL | 0 refills | Status: AC

## 2022-08-07 MED ORDER — CEFTRIAXONE SODIUM 500 MG IJ SOLR
500.0000 mg | Freq: Once | INTRAMUSCULAR | Status: AC
  Administered 2022-08-07: 500 mg via INTRAMUSCULAR

## 2022-08-07 MED ORDER — CEFTRIAXONE SODIUM 500 MG IJ SOLR
INTRAMUSCULAR | Status: AC
  Filled 2022-08-07: qty 500

## 2022-08-07 MED ORDER — LIDOCAINE HCL (PF) 1 % IJ SOLN
INTRAMUSCULAR | Status: AC
  Filled 2022-08-07: qty 2

## 2022-08-07 NOTE — ED Triage Notes (Signed)
Pt reports possible exposure to Chlamydia and Gonorrhea. States have been having nausea and intermittent dysuria x 2 weeks. Requesting STD testing and treatment.

## 2022-08-07 NOTE — Discharge Instructions (Addendum)
The pregnancy test was negative  Urinalysis did not show any sign of urine infection  You have been given a shot of ceftriaxone 500 mg, for gonorrhea exposure  Take doxycycline 100 mg --1 capsule 2 times daily for 7 days, for chlamydia exposure.
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
## 2022-08-08 LAB — CERVICOVAGINAL ANCILLARY ONLY
Bacterial Vaginitis (gardnerella): POSITIVE — AB
Candida Glabrata: NEGATIVE
Candida Vaginitis: POSITIVE — AB
Chlamydia: NEGATIVE
Comment: NEGATIVE
Comment: NEGATIVE
Comment: NEGATIVE
Comment: NEGATIVE
Comment: NEGATIVE
Comment: NORMAL
Neisseria Gonorrhea: POSITIVE — AB
Trichomonas: NEGATIVE

## 2022-08-09 ENCOUNTER — Telehealth (HOSPITAL_COMMUNITY): Payer: Self-pay | Admitting: Emergency Medicine

## 2022-08-09 MED ORDER — METRONIDAZOLE 500 MG PO TABS: 500.0000 mg | ORAL_TABLET | Freq: Two times a day (BID) | ORAL | 0 refills | Status: DC

## 2022-08-09 MED ORDER — FLUCONAZOLE 150 MG PO TABS: 150.0000 mg | ORAL_TABLET | Freq: Once | ORAL | 0 refills | Status: AC

## 2022-08-09 MED ORDER — METRONIDAZOLE 0.75 % VA GEL: 1.0000 | Freq: Every day | VAGINAL | 0 refills | Status: AC

## 2022-09-07 ENCOUNTER — Inpatient Hospital Stay (HOSPITAL_COMMUNITY): Admission: RE | Admit: 2022-09-07 | Payer: Self-pay | Source: Ambulatory Visit

## 2022-09-12 ENCOUNTER — Encounter (HOSPITAL_COMMUNITY): Payer: Self-pay

## 2022-09-12 ENCOUNTER — Ambulatory Visit (HOSPITAL_COMMUNITY)
Admission: RE | Admit: 2022-09-12 | Discharge: 2022-09-12 | Disposition: A | Discharge status: Home / Self Care | Payer: Self-pay | Source: Ambulatory Visit | Attending: Family Medicine | Admitting: Family Medicine

## 2022-09-12 VITALS — BP 123/79 | HR 85 | Temp 98.0°F | Resp 16

## 2022-09-12 DIAGNOSIS — R350 Frequency of micturition: Secondary | ICD-10-CM | POA: Insufficient documentation

## 2022-09-12 DIAGNOSIS — R3915 Urgency of urination: Secondary | ICD-10-CM | POA: Insufficient documentation

## 2022-09-12 LAB — POCT URINALYSIS DIPSTICK, ED / UC
Bilirubin Urine: NEGATIVE
Glucose, UA: NEGATIVE mg/dL
Hgb urine dipstick: NEGATIVE
Ketones, ur: NEGATIVE mg/dL
Nitrite: NEGATIVE
Protein, ur: NEGATIVE mg/dL
Specific Gravity, Urine: 1.025 (ref 1.005–1.030)
Urobilinogen, UA: 1 mg/dL (ref 0.0–1.0)
pH: 5.5 (ref 5.0–8.0)

## 2022-09-12 NOTE — ED Triage Notes (Signed)
Pt reports was treated for STis in September. Wants check up to make sure all good.  Reports for about week having urinary frequency. Reports last night had some lower abd pains.

## 2022-09-12 NOTE — ED Provider Notes (Signed)
[facility]    CSN: [id number] Arrival date & time: 09/12/22  2025      History   Chief Complaint Chief Complaint  Patient presents with   appt 1100    HPI Jaime Mendoza is a 31 y.o. female.   Patient presents with urinary frequency and urgency for 7 days.  Endorses that she has been having lower abdominal pains occurring intermittently since August 07, 2022, worsened last night, pains are described as sharp.  Abdominal pain initially accompanied dysuria and discharge, tested positive for bacterial vaginosis and yeast, endorses she completed all treatment.  Has had an sexual encounter after completing treatment, endorses that the condom slipped off during this period.  no known exposure.    Past Medical History:  Diagnosis Date   Anxiety    Asthma    Chlamydia    Depression    HSV-2 infection complicating pregnancy    Preterm labor    SVD (spontaneous vaginal delivery) 01/30/2012    Patient Active Problem List   Diagnosis Date Noted   Alcohol dependence with unspecified alcohol-induced disorder (HCC) 02/18/2021   Family history of MI (myocardial infarction) 02/18/2021   Severe recurrent major depression without psychotic features (HCC) 10/30/2018   S/P cesarean section and BTS  09/24/2016   History of PCR DNA positive for HSV2 09/18/2016   History of premature delivery, currently pregnant 07/05/2016   Supervision of normal intrauterine pregnancy in multigravida in first trimester 03/08/2016   MDD (major depressive disorder), single episode 11/08/2014    Past Surgical History:  Procedure Laterality Date   CESAREAN SECTION N/A 09/24/2016   Procedure: CESAREAN SECTION;  Surgeon: Tereso Newcomer, MD;  Location: [facility];  Service: Obstetrics;  Laterality: N/A;   NO PAST SURGERIES      OB History     Gravida  7   Para  5   Term  4   Preterm  1   AB  2   Living  5      SAB  2   IAB  0   Ectopic  0   Multiple  0   Live  Births  5            Home Medications    Prior to Admission medications   Medication Sig Start Date End Date Taking? Authorizing Provider  OLANZapine (ZYPREXA) 5 MG tablet Take 1 tablet (5 mg total) by mouth at bedtime. For mood control Patient not taking: Reported on 02/06/2020 11/01/18 02/06/20  Money, Gerlene Burdock, FNP  sertraline (ZOLOFT) 50 MG tablet Take 1 tablet (50 mg total) by mouth daily. For mood control Patient not taking: Reported on 02/06/2020 11/02/18 02/06/20  Money, Gerlene Burdock, FNP  traZODone (DESYREL) 50 MG tablet Take 1 tablet (50 mg total) by mouth at bedtime as needed for sleep. Patient not taking: Reported on 02/06/2020 11/01/18 02/06/20  Money, Gerlene Burdock, FNP    Family History Family History  Problem Relation Age of Onset   Cancer Father        colon   Seizures Sister    Diabetes Maternal Grandmother    Heart disease Maternal Grandfather    Heart disease Paternal Grandfather    Anesthesia problems Neg Hx    Hypotension Neg Hx    Malignant hyperthermia Neg Hx    Pseudochol deficiency Neg Hx     Social History Social History   Tobacco Use   Smoking status: Every Day    Packs/day: 0.25  Years: 5.00    Total pack years: 1.25    Types: Cigarettes   Smokeless tobacco: Never  Substance Use Topics   Alcohol use: Yes    Comment: socially   Drug use: Yes    Types: Cocaine, Marijuana    Comment: Positive for Cocaine, THC, Amphetaines      Allergies   Percocet [oxycodone-acetaminophen]   Review of Systems Review of Systems Defer to HPI    Physical Exam Triage Vital Signs ED Triage Vitals  Enc Vitals Group     BP 09/12/22 1008 123/79     Pulse Rate 09/12/22 1008 85     Resp 09/12/22 1008 16     Temp 09/12/22 1008 98 F (36.7 C)     Temp Source 09/12/22 1008 Oral     SpO2 09/12/22 1008 97 %     Weight --      Height --      Head Circumference --      Peak Flow --      Pain Score 09/12/22 1007 0     Pain Loc --      Pain Edu? --      Excl. in  [place]? --    No data found.  Updated Vital Signs BP 123/79 (BP Location: Right Arm)   Pulse 85   Temp 98 F (36.7 C) (Oral)   Resp 16   LMP 08/25/2022   SpO2 97%   Visual Acuity Right Eye Distance:   Left Eye Distance:   Bilateral Distance:    Right Eye Near:   Left Eye Near:    Bilateral Near:     Physical Exam Constitutional:      Appearance: Normal appearance.  Eyes:     Extraocular Movements: Extraocular movements intact.  Pulmonary:     Effort: Pulmonary effort is normal.  Abdominal:     General: Abdomen is flat. Bowel sounds are normal.     Palpations: Abdomen is soft.     Tenderness: There is abdominal tenderness in the suprapubic area.  Genitourinary:    Comments: Deferred  Neurological:     Mental Status: She is alert.  Psychiatric:        Behavior: Behavior normal.      UC Treatments / Results  Labs (all labs ordered are listed, but only abnormal results are displayed) Labs Reviewed  POCT URINALYSIS DIPSTICK, ED / UC  CERVICOVAGINAL ANCILLARY ONLY    EKG   Radiology No results found.  Procedures Procedures (including critical care time)  Medications Ordered in UC Medications - No data to display  Initial Impression / Assessment and Plan / UC Course  I have reviewed the triage vital signs and the nursing notes.  Pertinent labs & imaging results that were available during my care of the patient were reviewed by me and considered in my medical decision making (see chart for details).  Urinary frequency, urinary urgency  Urinalysis negative, discussed with patient, STI labs pending, will treat per protocol, advised abstinence until lab results, treatment is complete and all symptoms have resolved, may follow-up with urgent care as needed if symptoms persist Final Clinical Impressions(s) / UC Diagnoses   Final diagnoses:  None   Discharge Instructions   None    ED Prescriptions   None    PDMP not reviewed this encounter.   Hans Eden, NP 09/12/22 1120

## 2022-09-12 NOTE — Discharge Instructions (Signed)
Urinalysis negative  Labs pending 2-3 days, you will be contacted if positive for any sti and treatment will be sent to the pharmacy, you will have to return to the clinic if positive for gonorrhea to receive treatment   Please refrain from having sex until labs results, if positive please refrain from having sex until treatment complete and symptoms resolve   If positive for , Chlamydia  gonorrhea or trichomoniasis please notify partner or partners so they may tested as well  Moving forward, it is recommended you use some form of protection against the transmission of sti infections  such as condoms or dental dams with each sexual encounter

## 2022-09-13 ENCOUNTER — Telehealth (HOSPITAL_COMMUNITY): Payer: Self-pay | Admitting: Emergency Medicine

## 2022-09-13 LAB — CERVICOVAGINAL ANCILLARY ONLY
Bacterial Vaginitis (gardnerella): POSITIVE — AB
Candida Glabrata: NEGATIVE
Candida Vaginitis: NEGATIVE
Chlamydia: NEGATIVE
Comment: NEGATIVE
Comment: NEGATIVE
Comment: NEGATIVE
Comment: NEGATIVE
Comment: NEGATIVE
Comment: NORMAL
Neisseria Gonorrhea: NEGATIVE
Trichomonas: NEGATIVE

## 2022-09-13 MED ORDER — METRONIDAZOLE 500 MG PO TABS: 500.0000 mg | ORAL_TABLET | Freq: Two times a day (BID) | ORAL | 0 refills | Status: DC

## 2022-12-27 ENCOUNTER — Inpatient Hospital Stay (HOSPITAL_COMMUNITY): Admission: RE | Admit: 2022-12-27 | Payer: Self-pay | Source: Ambulatory Visit

## 2023-01-31 ENCOUNTER — Ambulatory Visit (HOSPITAL_COMMUNITY): Payer: Self-pay

## 2023-02-06 ENCOUNTER — Ambulatory Visit: Payer: Medicaid Other | Admitting: Physician Assistant

## 2023-04-24 ENCOUNTER — Emergency Department (HOSPITAL_COMMUNITY): Payer: Medicaid Other

## 2023-04-24 ENCOUNTER — Encounter (HOSPITAL_COMMUNITY): Payer: Self-pay

## 2023-04-24 ENCOUNTER — Inpatient Hospital Stay (HOSPITAL_COMMUNITY)
Admission: EM | Admit: 2023-04-24 | Discharge: 2023-04-28 | DRG: 690 | Disposition: A | Discharge status: Home / Self Care | Payer: Medicaid Other | Attending: Internal Medicine | Admitting: Internal Medicine

## 2023-04-24 ENCOUNTER — Other Ambulatory Visit: Payer: Self-pay

## 2023-04-24 DIAGNOSIS — K5732 Diverticulitis of large intestine without perforation or abscess without bleeding: Secondary | ICD-10-CM | POA: Diagnosis present

## 2023-04-24 DIAGNOSIS — Z98891 History of uterine scar from previous surgery: Secondary | ICD-10-CM

## 2023-04-24 DIAGNOSIS — Z885 Allergy status to narcotic agent status: Secondary | ICD-10-CM

## 2023-04-24 DIAGNOSIS — Z79899 Other long term (current) drug therapy: Secondary | ICD-10-CM

## 2023-04-24 DIAGNOSIS — F17210 Nicotine dependence, cigarettes, uncomplicated: Secondary | ICD-10-CM | POA: Diagnosis present

## 2023-04-24 DIAGNOSIS — N10 Acute tubulo-interstitial nephritis: Principal | ICD-10-CM | POA: Diagnosis present

## 2023-04-24 DIAGNOSIS — Z8249 Family history of ischemic heart disease and other diseases of the circulatory system: Secondary | ICD-10-CM

## 2023-04-24 DIAGNOSIS — K5792 Diverticulitis of intestine, part unspecified, without perforation or abscess without bleeding: Secondary | ICD-10-CM | POA: Diagnosis not present

## 2023-04-24 DIAGNOSIS — E440 Moderate protein-calorie malnutrition: Secondary | ICD-10-CM | POA: Insufficient documentation

## 2023-04-24 DIAGNOSIS — J45909 Unspecified asthma, uncomplicated: Secondary | ICD-10-CM | POA: Diagnosis present

## 2023-04-24 DIAGNOSIS — N8003 Adenomyosis of the uterus: Secondary | ICD-10-CM | POA: Diagnosis present

## 2023-04-24 DIAGNOSIS — Z681 Body mass index (BMI) 19 or less, adult: Secondary | ICD-10-CM

## 2023-04-24 DIAGNOSIS — N12 Tubulo-interstitial nephritis, not specified as acute or chronic: Secondary | ICD-10-CM

## 2023-04-24 DIAGNOSIS — E876 Hypokalemia: Secondary | ICD-10-CM | POA: Diagnosis present

## 2023-04-24 LAB — URINALYSIS, ROUTINE W REFLEX MICROSCOPIC
Bacteria, UA: NONE SEEN
Bilirubin Urine: NEGATIVE
Glucose, UA: NEGATIVE mg/dL
Hgb urine dipstick: NEGATIVE
Ketones, ur: NEGATIVE mg/dL
Nitrite: NEGATIVE
Protein, ur: NEGATIVE mg/dL
Specific Gravity, Urine: 1.014 (ref 1.005–1.030)
WBC, UA: >50 WBC/hpf (ref 0–5)
pH: 7 (ref 5.0–8.0)

## 2023-04-24 LAB — I-STAT CHEM 8, ED
BUN: 9 mg/dL (ref 6–20)
Calcium, Ion: 1.24 mmol/L (ref 1.15–1.40)
Chloride: 105 mmol/L (ref 98–111)
Creatinine, Ser: 0.7 mg/dL (ref 0.44–1.00)
Glucose, Bld: 96 mg/dL (ref 70–99)
HCT: 34 % — ABNORMAL LOW (ref 36.0–46.0)
Hemoglobin: 11.6 g/dL — ABNORMAL LOW (ref 12.0–15.0)
Potassium: 3.6 mmol/L (ref 3.5–5.1)
Sodium: 138 mmol/L (ref 135–145)
TCO2: 20 mmol/L — ABNORMAL LOW (ref 22–32)

## 2023-04-24 LAB — I-STAT BETA HCG BLOOD, ED (MC, WL, AP ONLY): I-stat hCG, quantitative: <5 m[IU]/mL (ref <5)

## 2023-04-24 LAB — CBC WITH DIFFERENTIAL/PLATELET
Abs Immature Granulocytes: 0.02 10*3/uL (ref 0.00–0.07)
Basophils Absolute: 0 10*3/uL (ref 0.0–0.1)
Basophils Relative: 0 %
Eosinophils Absolute: 0.2 10*3/uL (ref 0.0–0.5)
Eosinophils Relative: 2 %
HCT: 34.4 % — ABNORMAL LOW (ref 36.0–46.0)
Hemoglobin: 11.5 g/dL — ABNORMAL LOW (ref 12.0–15.0)
Immature Granulocytes: 0 %
Lymphocytes Relative: 14 %
Lymphs Abs: 1.5 10*3/uL (ref 0.7–4.0)
MCH: 29.7 pg (ref 26.0–34.0)
MCHC: 33.4 g/dL (ref 30.0–36.0)
MCV: 88.9 fL (ref 80.0–100.0)
Monocytes Absolute: 0.9 10*3/uL (ref 0.1–1.0)
Monocytes Relative: 8 %
Neutro Abs: 8.1 10*3/uL — ABNORMAL HIGH (ref 1.7–7.7)
Neutrophils Relative %: 76 %
Platelets: 155 10*3/uL (ref 150–400)
RBC: 3.87 MIL/uL (ref 3.87–5.11)
RDW: 13.4 % (ref 11.5–15.5)
WBC: 10.6 10*3/uL — ABNORMAL HIGH (ref 4.0–10.5)
nRBC: 0 % (ref 0.0–0.2)

## 2023-04-24 LAB — COMPREHENSIVE METABOLIC PANEL
ALT: 13 U/L (ref 0–44)
AST: 20 U/L (ref 15–41)
Albumin: 3.9 g/dL (ref 3.5–5.0)
Alkaline Phosphatase: 42 U/L (ref 38–126)
Anion gap: 8 (ref 5–15)
BUN: 11 mg/dL (ref 6–20)
CO2: 20 mmol/L — ABNORMAL LOW (ref 22–32)
Calcium: 9.1 mg/dL (ref 8.9–10.3)
Chloride: 107 mmol/L (ref 98–111)
Creatinine, Ser: 0.79 mg/dL (ref 0.44–1.00)
GFR, Estimated: >60 mL/min (ref >60)
Glucose, Bld: 98 mg/dL (ref 70–99)
Potassium: 3.5 mmol/L (ref 3.5–5.1)
Sodium: 135 mmol/L (ref 135–145)
Total Bilirubin: 0.7 mg/dL (ref 0.3–1.2)
Total Protein: 7.1 g/dL (ref 6.5–8.1)

## 2023-04-24 LAB — WET PREP, GENITAL
Clue Cells Wet Prep HPF POC: NONE SEEN
Sperm: NONE SEEN
Trich, Wet Prep: NONE SEEN
WBC, Wet Prep HPF POC: <10 (ref <10)
Yeast Wet Prep HPF POC: NONE SEEN

## 2023-04-24 MED ORDER — METRONIDAZOLE 500 MG/100ML IV SOLN
500.0000 mg | Freq: Two times a day (BID) | INTRAVENOUS | Status: DC
  Administered 2023-04-24 – 2023-04-27 (×6): 500 mg via INTRAVENOUS
  Filled 2023-04-24 (×6): qty 100

## 2023-04-24 MED ORDER — HYDROMORPHONE HCL 1 MG/ML IJ SOLN
1.0000 mg | INTRAMUSCULAR | Status: DC | PRN
  Administered 2023-04-24 – 2023-04-27 (×10): 1 mg via INTRAVENOUS
  Filled 2023-04-24 (×11): qty 1

## 2023-04-24 MED ORDER — MORPHINE SULFATE (PF) 4 MG/ML IV SOLN
4.0000 mg | Freq: Once | INTRAVENOUS | Status: AC
  Administered 2023-04-24: 4 mg via INTRAVENOUS
  Filled 2023-04-24: qty 1

## 2023-04-24 MED ORDER — HYDROMORPHONE HCL 1 MG/ML IJ SOLN
1.0000 mg | Freq: Once | INTRAMUSCULAR | Status: AC
  Administered 2023-04-24: 1 mg via INTRAVENOUS
  Filled 2023-04-24: qty 1

## 2023-04-24 MED ORDER — ONDANSETRON HCL 4 MG/2ML IJ SOLN
4.0000 mg | Freq: Four times a day (QID) | INTRAMUSCULAR | Status: DC | PRN
  Administered 2023-04-25 – 2023-04-26 (×2): 4 mg via INTRAVENOUS
  Filled 2023-04-24 (×2): qty 2

## 2023-04-24 MED ORDER — FENTANYL CITRATE PF 50 MCG/ML IJ SOSY
50.0000 ug | PREFILLED_SYRINGE | Freq: Once | INTRAMUSCULAR | Status: DC
  Filled 2023-04-24: qty 1

## 2023-04-24 MED ORDER — IOHEXOL 350 MG/ML SOLN
80.0000 mL | Freq: Once | INTRAVENOUS | Status: AC | PRN
  Administered 2023-04-24: 80 mL via INTRAVENOUS

## 2023-04-24 MED ORDER — ONDANSETRON HCL 4 MG/2ML IJ SOLN
4.0000 mg | Freq: Once | INTRAMUSCULAR | Status: AC
  Administered 2023-04-24: 4 mg via INTRAVENOUS
  Filled 2023-04-24: qty 2

## 2023-04-24 MED ORDER — MORPHINE SULFATE (PF) 2 MG/ML IV SOLN
2.0000 mg | INTRAVENOUS | Status: DC | PRN
  Administered 2023-04-25 (×2): 2 mg via INTRAVENOUS
  Filled 2023-04-24 (×2): qty 1

## 2023-04-24 MED ORDER — SODIUM CHLORIDE 0.9 % IV SOLN
1.0000 g | Freq: Once | INTRAVENOUS | Status: AC
  Administered 2023-04-24: 1 g via INTRAVENOUS
  Filled 2023-04-24: qty 10

## 2023-04-24 MED ORDER — SODIUM CHLORIDE 0.9 % IV BOLUS
1000.0000 mL | Freq: Once | INTRAVENOUS | Status: AC
  Administered 2023-04-24: 1000 mL via INTRAVENOUS

## 2023-04-24 MED ORDER — KETOROLAC TROMETHAMINE 30 MG/ML IJ SOLN
30.0000 mg | Freq: Once | INTRAMUSCULAR | Status: AC
  Administered 2023-04-24: 30 mg via INTRAVENOUS
  Filled 2023-04-24: qty 1

## 2023-04-24 MED ORDER — SODIUM CHLORIDE 0.9 % IV SOLN: INTRAVENOUS | Status: AC

## 2023-04-24 MED ORDER — SODIUM CHLORIDE 0.9 % IV SOLN
2.0000 g | INTRAVENOUS | Status: DC
  Administered 2023-04-25 – 2023-04-26 (×2): 2 g via INTRAVENOUS
  Filled 2023-04-24 (×2): qty 20

## 2023-04-24 MED ORDER — FENTANYL CITRATE PF 50 MCG/ML IJ SOSY: 12.5000 ug | PREFILLED_SYRINGE | Freq: Once | INTRAMUSCULAR | Status: DC

## 2023-04-24 NOTE — ED Notes (Signed)
Pt requested for this RN to contact grandmother, grandmother, Selmer Dominion, contacted and updated on pt status

## 2023-04-24 NOTE — ED Notes (Signed)
Pt self swabbing per Dr. Silverio Lay for pelvic swabs

## 2023-04-24 NOTE — ED Triage Notes (Signed)
BIB EMS from home for right sided abdominal pain that started 2 hours ago. Denies N/V/D.  Denies ETOH or drug use and states her tubes are tied.

## 2023-04-24 NOTE — ED Provider Notes (Signed)
[place] [facility] Provider Note   CSN: [id number] Arrival date & time: 04/24/23  1655     History  Chief Complaint  Patient presents with   Abdominal Pain    Jaime Mendoza is a 32 y.o. female otherwise healthy here presenting with likely onset of right flank pain.  Patient states that she was taking a nap and tried to get up and acute onset of right flank pain.  Patient states that it is worse at labor and no position was comfortable for her.  Denies any urinary symptoms.  Patient denies any history of kidney stones.  The history is provided by the patient.       Home Medications Prior to Admission medications   Medication Sig Start Date End Date Taking? Authorizing Provider  metroNIDAZOLE (FLAGYL) 500 MG tablet Take 1 tablet (500 mg total) by mouth 2 (two) times daily. 09/13/22   Lamptey, Britta Mccreedy, MD  OLANZapine (ZYPREXA) 5 MG tablet Take 1 tablet (5 mg total) by mouth at bedtime. For mood control Patient not taking: Reported on 02/06/2020 11/01/18 02/06/20  Money, Gerlene Burdock, FNP  sertraline (ZOLOFT) 50 MG tablet Take 1 tablet (50 mg total) by mouth daily. For mood control Patient not taking: Reported on 02/06/2020 11/02/18 02/06/20  Money, Gerlene Burdock, FNP  traZODone (DESYREL) 50 MG tablet Take 1 tablet (50 mg total) by mouth at bedtime as needed for sleep. Patient not taking: Reported on 02/06/2020 11/01/18 02/06/20  Money, Gerlene Burdock, FNP      Allergies    Percocet [oxycodone-acetaminophen]    Review of Systems   Review of Systems  Gastrointestinal:  Positive for abdominal pain.  Genitourinary:  Positive for flank pain.  All other systems reviewed and are negative.   Physical Exam Updated Vital Signs BP 122/68   Pulse 98   Temp 99.7 F (37.6 C)   Resp (!) 29   Ht 5\' 11"  (1.803 m)   Wt 50.8 kg   SpO2 100%   BMI 15.62 kg/m  Physical Exam Vitals and nursing note reviewed.  Constitutional:      Comments: Uncomfortable and writhing  around  HENT:     Head: Normocephalic.  Eyes:     Extraocular Movements: Extraocular movements intact.  Cardiovascular:     Rate and Rhythm: Normal rate and regular rhythm.  Pulmonary:     Effort: Pulmonary effort is normal.     Breath sounds: Normal breath sounds.  Abdominal:     Comments: + Right CVA tenderness  Skin:    General: Skin is warm.     Capillary Refill: Capillary refill takes less than 2 seconds.  Neurological:     General: No focal deficit present.     Mental Status: She is alert and oriented to person, place, and time.  Psychiatric:        Mood and Affect: Mood normal.        Behavior: Behavior normal.     ED Results / Procedures / Treatments   Labs (all labs ordered are listed, but only abnormal results are displayed) Labs Reviewed  CBC WITH DIFFERENTIAL/PLATELET  COMPREHENSIVE METABOLIC PANEL  URINALYSIS, ROUTINE W REFLEX MICROSCOPIC  I-STAT BETA HCG BLOOD, ED ([facility], [facility], [facility] ONLY)    EKG EKG Interpretation  Date/Time:  Tuesday Apr 24 2023 17:05:17 EDT Ventricular Rate:  99 PR Interval:  126 QRS Duration: 80 QT Interval:  324 QTC Calculation: 416 R Axis:   71 Text Interpretation: Sinus  rhythm No significant change since last tracing Confirmed by Richardean Canal [phone]) on 04/24/2023 5:15:28 PM  Radiology No results found.  Procedures Procedures    Medications Ordered in ED Medications  morphine (PF) 4 MG/ML injection 4 mg (has no administration in time range)  sodium chloride 0.9 % bolus 1,000 mL (has no administration in time range)    ED Course/ Medical Decision Making/ A&P                             Medical Decision Making Jaime Mendoza is a 32 y.o. female here with R flank pain. Concerned for renal colic. Will get labs, UA, CT renal stone. Will give pain meds and reassess.   8 pm  I reviewed patient's CT renal stone I did not show a kidney stone.  Patient is still writhing around in pain.  I reassessed patient and she has more right  lower quadrant and right upper quadrant tenderness.  Ordered transvaginal ultrasound and also right upper quadrant ultrasound.  9 pm I reviewed patient's ultrasounds and they were unremarkable.  Patient is still requesting pain medicine and still very uncomfortable.  I ordered CT dissection study and order more pain medicine.  UA positive for UTI.  Ordered Rocephin  9:20 PM Patient dissection study was negative.  Patient is still in a lot of pain so we will admit for pain control for pyelonephritis.   Problems Addressed: Pyelonephritis: acute illness or injury  Amount and/or Complexity of Data Reviewed Labs: ordered. Decision-making details documented in ED Course. Radiology: ordered and independent interpretation performed. Decision-making details documented in ED Course.  Risk Prescription drug management. Decision regarding hospitalization.    Final Clinical Impression(s) / ED Diagnoses Final diagnoses:  None    Rx / DC Orders ED Discharge Orders     None         Charlynne Pander, MD 04/24/23 2120

## 2023-04-24 NOTE — Assessment & Plan Note (Addendum)
-  presented with right quadrant with radiation to right flank/mid thoracic that started just prior to admission -had extensive workup in ED with CTA chest/abd/pelvis dissection study, RUQ ultrasound and pelvis ultrasound. No significant finding except for acute ascending colonic diverticulitis  -Full liquid diet for bowel rest -IV gentle fluid hydration overnight -IV Rocephin and Flagyl -IV PRN opioids for pain. Pt has benign abdominal exam without peritoneal signs and was mostly laying comfortably during exam but complained of pain out of proportion to her findings. Will continue to monitor clinically.

## 2023-04-24 NOTE — H&P (Signed)
History and Physical    Patient: Jaime Mendoza AOZ:[id number] DOB: 22-Apr-1991 DOA: 04/24/2023 DOS: the patient was seen and examined on 04/24/2023 PCP: Patient, No Pcp Per  Patient coming from: Home  Chief Complaint:  Chief Complaint  Patient presents with   Abdominal Pain   HPI: Jaime Mendoza is a 32 y.o. female with medical history significant of asthma, anxiety, STD who presents with abdominal pain.   Reports pain just started this morning. Pain is right sided and radiates to her right flank. States pain is hard to describe. Has one episode of vomiting. Feels nauseous only after receiving pain medication in ED. No diarrhea. Denies dysuria, but has felt increase frequency. Has hx of C-section. Does not take any medications on regular basis. Only has occasional alcohol use. She is sexually active with last intercourse about 2 days ago. Not currently on her menstrual cycle.   In the ED, she was afebrile, normotensive on room air.   CBC with mild leukocytosis of 10.6, Hgb of 11.5.  CMP unremarkable.   UA with positive leukocytes, negative nitrate, >50 WBC Negative wet prep for trichomonas, yeast or clue cells  Had extensive imaging with CT renal and CTA chest/abd/pelvis study revealing only for acute ascending diverticulitis without perforation or abscess.   Pelvic outside showed 2cm left ovarian corpus luteum, mild adenomyosis.   RUQ ultrasound negative.   Pt was given repeated dosing of IV morphine, dilaudid, Toradol and continued to have persistent abdominal pain so hospitalist was consulted for pain control. She was also started on IV Rocephin.  Review of Systems: As mentioned in the history of present illness. All other systems reviewed and are negative. Past Medical History:  Diagnosis Date   Anxiety    Asthma    Chlamydia    Depression    HSV-2 infection complicating pregnancy    Preterm labor    SVD (spontaneous vaginal delivery) 01/30/2012   Past Surgical History:   Procedure Laterality Date   CESAREAN SECTION N/A 09/24/2016   Procedure: CESAREAN SECTION;  Surgeon: Tereso Newcomer, MD;  Location: [facility];  Service: Obstetrics;  Laterality: N/A;   NO PAST SURGERIES     Social History:  reports that she has been smoking cigarettes. She has a 1.25 pack-year smoking history. She has never used smokeless tobacco. She reports current alcohol use. She reports current drug use. Drugs: Cocaine and Marijuana.  Allergies  Allergen Reactions   Percocet [Oxycodone-Acetaminophen] Itching    Family History  Problem Relation Age of Onset   Cancer Father        colon   Seizures Sister    Diabetes Maternal Grandmother    Heart disease Maternal Grandfather    Heart disease Paternal Grandfather    Anesthesia problems Neg Hx    Hypotension Neg Hx    Malignant hyperthermia Neg Hx    Pseudochol deficiency Neg Hx     Prior to Admission medications   Medication Sig Start Date End Date Taking? Authorizing Provider  metroNIDAZOLE (FLAGYL) 500 MG tablet Take 1 tablet (500 mg total) by mouth 2 (two) times daily. 09/13/22   Lamptey, Britta Mccreedy, MD  OLANZapine (ZYPREXA) 5 MG tablet Take 1 tablet (5 mg total) by mouth at bedtime. For mood control Patient not taking: Reported on 02/06/2020 11/01/18 02/06/20  Money, Gerlene Burdock, FNP  sertraline (ZOLOFT) 50 MG tablet Take 1 tablet (50 mg total) by mouth daily. For mood control Patient not taking: Reported on 02/06/2020 11/02/18 02/06/20  Money, Gerlene Burdock, FNP  traZODone (DESYREL) 50 MG tablet Take 1 tablet (50 mg total) by mouth at bedtime as needed for sleep. Patient not taking: Reported on 02/06/2020 11/01/18 02/06/20  Money, Gerlene Burdock, FNP    Physical Exam: Vitals:   04/24/23 1900 04/24/23 1930 04/24/23 2000 04/24/23 2203  BP: 110/61 (!) 105/59  103/66  Pulse: 94 93  87  Resp: 17 12  20   Temp:   99.7 F (37.6 C) 98.2 F (36.8 C)  TempSrc:    Oral  SpO2: 98% 96%  100%  Weight:      Height:       Constitutional:  NAD, calm, comfortable, thin young female laying in bed Eyes: lids and conjunctivae normal ENMT: Mucous membranes are moist.  Neck: normal, supple Respiratory: clear to auscultation bilaterally, no wheezing, no crackles. Normal respiratory effort. No accessory muscle use.  Cardiovascular: Regular rate and rhythm, no murmurs / rubs / gallops. No extremity edema. .  Abdomen: soft, non-distended, moderate tenderness to palpation of right mid quadrants and right flank. No rebound tenderness, guarding or rigidity.  Musculoskeletal: no clubbing / cyanosis. No joint deformity upper and lower extremities. Good ROM, no contractures. Normal muscle tone.  Skin: no rashes, lesions, ulcers.  Neurologic: CN 2-12 grossly intact.  Psychiatric: Normal judgment and insight. Alert and oriented x 3. Normal mood.   Data Reviewed:  See HPI  Assessment and Plan: Acute diverticulitis -presented with right quadrant with radiation to right flank/mid thoracic that started just prior to admission -had extensive workup in ED with CTA chest/abd/pelvis dissection study, RUQ ultrasound and pelvis ultrasound. No significant finding except for acute ascending colonic diverticulitis  -Full liquid diet for bowel rest -IV gentle fluid hydration overnight -IV Rocephin and Flagyl -IV PRN opioids for pain. Pt has benign abdominal exam without peritoneal signs and was mostly laying comfortably during exam but complained of pain out of proportion to her findings. Will continue to monitor clinically.       Advance Care Planning: Full   Consults: none  Family Communication: none at bedside  Severity of Illness: The appropriate patient status for this patient is OBSERVATION. Observation status is judged to be reasonable and necessary in order to provide the required intensity of service to ensure the patient's safety. The patient's presenting symptoms, physical exam findings, and initial radiographic and laboratory data in the  context of their medical condition is felt to place them at decreased risk for further clinical deterioration. Furthermore, it is anticipated that the patient will be medically stable for discharge from the hospital within 2 midnights of admission.   Author: Anselm Jungling, DO 04/24/2023 10:34 PM  For on call review [email or web address].

## 2023-04-24 NOTE — ED Notes (Signed)
Pt states pain medication is not working. Dr. Silverio Lay notified

## 2023-04-25 DIAGNOSIS — F17210 Nicotine dependence, cigarettes, uncomplicated: Secondary | ICD-10-CM | POA: Diagnosis present

## 2023-04-25 DIAGNOSIS — K5792 Diverticulitis of intestine, part unspecified, without perforation or abscess without bleeding: Secondary | ICD-10-CM | POA: Diagnosis not present

## 2023-04-25 DIAGNOSIS — Z8249 Family history of ischemic heart disease and other diseases of the circulatory system: Secondary | ICD-10-CM | POA: Diagnosis not present

## 2023-04-25 DIAGNOSIS — Z681 Body mass index (BMI) 19 or less, adult: Secondary | ICD-10-CM | POA: Diagnosis not present

## 2023-04-25 DIAGNOSIS — K5732 Diverticulitis of large intestine without perforation or abscess without bleeding: Secondary | ICD-10-CM | POA: Diagnosis present

## 2023-04-25 DIAGNOSIS — N10 Acute tubulo-interstitial nephritis: Secondary | ICD-10-CM | POA: Diagnosis present

## 2023-04-25 DIAGNOSIS — E876 Hypokalemia: Secondary | ICD-10-CM | POA: Diagnosis present

## 2023-04-25 DIAGNOSIS — J45909 Unspecified asthma, uncomplicated: Secondary | ICD-10-CM | POA: Diagnosis present

## 2023-04-25 DIAGNOSIS — Z98891 History of uterine scar from previous surgery: Secondary | ICD-10-CM | POA: Diagnosis not present

## 2023-04-25 DIAGNOSIS — Z885 Allergy status to narcotic agent status: Secondary | ICD-10-CM | POA: Diagnosis not present

## 2023-04-25 DIAGNOSIS — Z79899 Other long term (current) drug therapy: Secondary | ICD-10-CM | POA: Diagnosis not present

## 2023-04-25 DIAGNOSIS — N8003 Adenomyosis of the uterus: Secondary | ICD-10-CM | POA: Diagnosis present

## 2023-04-25 DIAGNOSIS — N12 Tubulo-interstitial nephritis, not specified as acute or chronic: Secondary | ICD-10-CM | POA: Diagnosis present

## 2023-04-25 DIAGNOSIS — E440 Moderate protein-calorie malnutrition: Secondary | ICD-10-CM | POA: Diagnosis present

## 2023-04-25 LAB — GC/CHLAMYDIA PROBE AMP (~~LOC~~) NOT AT ARMC
Chlamydia: NEGATIVE
Comment: NEGATIVE
Comment: NORMAL
Neisseria Gonorrhea: NEGATIVE

## 2023-04-25 LAB — CBC
HCT: 34.4 % — ABNORMAL LOW (ref 36.0–46.0)
Hemoglobin: 10.9 g/dL — ABNORMAL LOW (ref 12.0–15.0)
MCH: 29.5 pg (ref 26.0–34.0)
MCHC: 31.7 g/dL (ref 30.0–36.0)
MCV: 93 fL (ref 80.0–100.0)
Platelets: 130 10*3/uL — ABNORMAL LOW (ref 150–400)
RBC: 3.7 MIL/uL — ABNORMAL LOW (ref 3.87–5.11)
RDW: 13.5 % (ref 11.5–15.5)
WBC: 13.1 10*3/uL — ABNORMAL HIGH (ref 4.0–10.5)
nRBC: 0.2 % (ref 0.0–0.2)

## 2023-04-25 LAB — HIV ANTIBODY (ROUTINE TESTING W REFLEX): HIV Screen 4th Generation wRfx: NONREACTIVE

## 2023-04-25 MED ORDER — POLYETHYLENE GLYCOL 3350 17 G PO PACK
17.0000 g | PACK | Freq: Every day | ORAL | Status: DC
  Administered 2023-04-25 – 2023-04-26 (×2): 17 g via ORAL
  Filled 2023-04-25 (×2): qty 1

## 2023-04-25 MED ORDER — ORAL CARE MOUTH RINSE: 15.0000 mL | OROMUCOSAL | Status: DC | PRN

## 2023-04-25 MED ORDER — SENNOSIDES-DOCUSATE SODIUM 8.6-50 MG PO TABS
1.0000 | ORAL_TABLET | Freq: Two times a day (BID) | ORAL | Status: DC
  Administered 2023-04-25: 1 via ORAL
  Filled 2023-04-25 (×2): qty 1

## 2023-04-25 NOTE — Progress Notes (Signed)
  Transition of Care (TOC) Screening Note   Patient Details  Name: Jaime Mendoza Date of Birth: [birth year]   Transition of Care [facility]) CM/SW Contact:    Lanier Clam, RN Phone Number: 04/25/2023, 12:59 PM    Transition of Care Department [facility] has reviewed patient and no TOC needs have been identified at this time. We will continue to monitor patient advancement through interdisciplinary progression rounds. If new patient transition needs arise, please place a TOC consult.

## 2023-04-25 NOTE — Progress Notes (Signed)
PROGRESS NOTE  BARAA LABARR ZOX:[id number] DOB: [birth year] DOA: 04/24/2023 PCP: Patient, No Pcp Per  HPI/Recap of past 24 hours: Jaime Mendoza is a 32 y.o. female with medical history significant of asthma, anxiety, STD who presents with right sided abdominal pain with an episode of vomiting X 1 day. No diarrhea. Denies dysuria, but has felt increase frequency. Has hx of C-section. Does not take any medications on regular basis. Only has occasional alcohol use. Not currently on her menstrual cycle. In the ED, she was afebrile, normotensive on room air. UA with positive leukocytes, negative nitrate, >50 WBC Negative wet prep for trichomonas, yeast or clue cells. Extensive imaging with CT renal and CTA chest/abd/pelvis showed acute ascending diverticulitis without perforation or abscess. Pelvic USS showed 2cm left ovarian corpus luteum, mild adenomyosis. RUQ ultrasound negative.  Patient admitted for further management.    Today, patient reported persistent abdominal pain, may have a low pain threshold as was noted to be wincing/dressing and turning in bed due to pain as soon as I walked in.  Denies any other new complaints.   Assessment/Plan: Principal Problem:   Acute pyelonephritis Active Problems:   Acute diverticulitis  Acute diverticulitis Currently afebrile, with leukocytosis CT abdomen showed acute ascending colonic diverticulitis  Full liquid diet, IV fluid IV Rocephin and Flagyl Pain management-appears to be exaggerated versus low pain threshold Strict bowel regimen  Unlikely UTI UA with large leukocytes, negative bacteria, WBC greater than 50, with significant squamous epithelial cells noted in a dirty collection On Rocephin    Estimated body mass index is 15.62 kg/m as calculated from the following:   Height as of this encounter: 5\' 11"  (1.803 m).   Weight as of this encounter: 50.8 kg.     Code Status: Full  Family Communication: None at  bedside  Disposition Plan: Status is: Inpatient Remains inpatient appropriate because: Level of care      Consultants: None  Procedures: None  Antimicrobials: Rocephin Flagyl  DVT prophylaxis: SCDs   Objective: Vitals:   04/25/23 0210 04/25/23 0651 04/25/23 1005 04/25/23 1229  BP: 96/71 125/83 108/67 114/75  Pulse: 92 (!) 104 75 78  Resp: 14 16 (!) 24 20  Temp: 98.7 F (37.1 C) 100 F (37.8 C) 99 F (37.2 C) 98.9 F (37.2 C)  TempSrc: Oral Oral Oral Oral  SpO2: 100% 96% 100% 100%  Weight:      Height:        Intake/Output Summary (Last 24 hours) at 04/25/2023 1744 Last data filed at 04/25/2023 0600 Gross per 24 hour  Intake 2040.39 ml  Output --  Net 2040.39 ml   Filed Weights   04/24/23 1700  Weight: 50.8 kg    Exam: General: NAD, thin Cardiovascular: S1, S2 present Respiratory: CTAB Abdomen: Soft, tender, nondistended, bowel sounds present Musculoskeletal: No bilateral pedal edema noted Skin: Normal Psychiatry: Normal mood     Data Reviewed: CBC: Recent Labs  Lab 04/24/23 1721 04/24/23 1727 04/25/23 0048  WBC 10.6*  --  13.1*  NEUTROABS 8.1*  --   --   HGB 11.5* 11.6* 10.9*  HCT 34.4* 34.0* 34.4*  MCV 88.9  --  93.0  PLT 155  --  130*   Basic Metabolic Panel: Recent Labs  Lab 04/24/23 1721 04/24/23 1727  NA 135 138  K 3.5 3.6  CL 107 105  CO2 20*  --   GLUCOSE 98 96  BUN 11 9  CREATININE 0.79 0.70  CALCIUM 9.1  --  GFR: Estimated Creatinine Clearance: 80.2 mL/min (by C-G formula based on SCr of 0.7 mg/dL). Liver Function Tests: Recent Labs  Lab 04/24/23 1721  AST 20  ALT 13  ALKPHOS 42  BILITOT 0.7  PROT 7.1  ALBUMIN 3.9   No results for input(s): "LIPASE", "AMYLASE" in the last 168 hours. No results for input(s): "AMMONIA" in the last 168 hours. Coagulation Profile: No results for input(s): "INR", "PROTIME" in the last 168 hours. Cardiac Enzymes: No results for input(s): "CKTOTAL", "CKMB", "CKMBINDEX",  "TROPONINI" in the last 168 hours. BNP (last 3 results) No results for input(s): "PROBNP" in the last 8760 hours. HbA1C: No results for input(s): "HGBA1C" in the last 72 hours. CBG: No results for input(s): "GLUCAP" in the last 168 hours. Lipid Profile: No results for input(s): "CHOL", "HDL", "LDLCALC", "TRIG", "CHOLHDL", "LDLDIRECT" in the last 72 hours. Thyroid Function Tests: No results for input(s): "TSH", "T4TOTAL", "FREET4", "T3FREE", "THYROIDAB" in the last 72 hours. Anemia Panel: No results for input(s): "VITAMINB12", "FOLATE", "FERRITIN", "TIBC", "IRON", "RETICCTPCT" in the last 72 hours. Urine analysis:    Component Value Date/Time   COLORURINE YELLOW 04/24/2023 1713   APPEARANCEUR CLOUDY (A) 04/24/2023 1713   LABSPEC 1.014 04/24/2023 1713   PHURINE 7.0 04/24/2023 1713   GLUCOSEU NEGATIVE 04/24/2023 1713   HGBUR NEGATIVE 04/24/2023 1713   BILIRUBINUR NEGATIVE 04/24/2023 1713   BILIRUBINUR negative 07/19/2016 1636   KETONESUR NEGATIVE 04/24/2023 1713   PROTEINUR NEGATIVE 04/24/2023 1713   UROBILINOGEN 1.0 09/12/2022 1051   NITRITE NEGATIVE 04/24/2023 1713   LEUKOCYTESUR LARGE (A) 04/24/2023 1713   Sepsis Labs: @LABRCNTIP (procalcitonin:4,lacticidven:4)  ) Recent Results (from the past 240 hour(s))  Wet prep, genital     Status: None   Collection Time: 04/24/23  7:14 PM  Result Value Ref Range Status   Yeast Wet Prep HPF POC NONE SEEN NONE SEEN Final   Trich, Wet Prep NONE SEEN NONE SEEN Final   Clue Cells Wet Prep HPF POC NONE SEEN NONE SEEN Final   WBC, Wet Prep HPF POC <10 <10 Final   Sperm NONE SEEN  Final    Comment: Performed at [facility], [address] [place], [place] [address]      Studies: CT Angio Chest/Abd/Pel for Dissection W and/or Wo Contrast  Result Date: 04/24/2023 CLINICAL DATA:  Acute aortic syndrome, right abdominal pain EXAM: CT ANGIOGRAPHY CHEST, ABDOMEN AND PELVIS TECHNIQUE: Non-contrast CT of the chest was  initially obtained. Multidetector CT imaging through the chest, abdomen and pelvis was performed using the standard protocol during bolus administration of intravenous contrast. Multiplanar reconstructed images and MIPs were obtained and reviewed to evaluate the vascular anatomy. RADIATION DOSE REDUCTION: This exam was performed according to the departmental dose-optimization program which includes automated exposure control, adjustment of the mA and/or kV according to patient size and/or use of iterative reconstruction technique. CONTRAST:  80mL OMNIPAQUE IOHEXOL 350 MG/ML SOLN COMPARISON:  None Available. FINDINGS: CTA CHEST FINDINGS Cardiovascular: Preferential opacification of the thoracic aorta. No evidence of thoracic aortic aneurysm or dissection. The pulmonary arterial tree is adequately opacified through the level of segmental pulmonary arteries and no intraluminal filling defects identified to suggest acute pulmonary embolism. Central pulmonary arteries are of normal caliber. Normal heart size. No pericardial effusion. Mediastinum/Nodes: No enlarged mediastinal, hilar, or axillary lymph nodes. Thyroid gland, trachea, and esophagus demonstrate no significant findings. Lungs/Pleura: Lungs are clear. No pleural effusion or pneumothorax. Musculoskeletal: No chest wall abnormality. No acute or significant osseous findings. Review of the  MIP images confirms the above findings. CTA ABDOMEN AND PELVIS FINDINGS VASCULAR Aorta: Normal caliber aorta without aneurysm, dissection, vasculitis or significant stenosis. Celiac: Patent without evidence of aneurysm, dissection, vasculitis or significant stenosis. SMA: Patent without evidence of aneurysm, dissection, vasculitis or significant stenosis. Renals: Both renal arteries are patent without evidence of aneurysm, dissection, vasculitis, fibromuscular dysplasia or significant stenosis. IMA: Patent without evidence of aneurysm, dissection, vasculitis or significant  stenosis. Inflow: Patent without evidence of aneurysm, dissection, vasculitis or significant stenosis. Veins: No obvious venous abnormality within the limitations of this arterial phase study. Review of the MIP images confirms the above findings. NON-VASCULAR Hepatobiliary: No focal liver abnormality is seen. No gallstones, gallbladder wall thickening, or biliary dilatation. Pancreas: Unremarkable. No pancreatic ductal dilatation or surrounding inflammatory changes. Spleen: Normal in size without focal abnormality. Adrenals/Urinary Tract: Adrenal glands are unremarkable. Kidneys are normal, without renal calculi, focal lesion, or hydronephrosis. Bladder is unremarkable. Stomach/Bowel: There is layering fluid within the right pericolic gutter along the anti mesenteric border of the ascending colon with fluid and mild bowel wall thickening focally surrounding a single diverticulum within the ascending colon at axial image # 204/10 most in keeping with acute ascending diverticulitis. No loculated pericolonic fluid collections are identified. No free intraperitoneal gas. The stomach, small bowel, and large bowel are otherwise unremarkable. No evidence of obstruction. Appendix normal. Lymphatic: No pathologic adenopathy within the abdomen and pelvis. Reproductive: Tubal ligation clips noted within the right adnexa. The pelvic organs are otherwise unremarkable. Other: No abdominal wall hernia. Musculoskeletal: No acute bone abnormality. No lytic or blastic bone lesion. Review of the MIP images confirms the above findings. IMPRESSION: 1. No evidence of thoracoabdominal aortic aneurysm or dissection. 2. No pulmonary embolism. 3. Acute ascending diverticulitis. No evidence of perforation or abscess formation. Electronically Signed   By: Helyn Numbers M.D.   On: 04/24/2023 20:42   US Pelvis Complete  Result Date: 04/24/2023 CLINICAL DATA:  Left lower quadrant abdominal pain. Rule out torsion. EXAM: TRANSABDOMINAL AND  TRANSVAGINAL ULTRASOUND OF PELVIS DOPPLER ULTRASOUND OF OVARIES TECHNIQUE: Both transabdominal and transvaginal ultrasound examinations of the pelvis were performed. Transabdominal technique was performed for global imaging of the pelvis including uterus, ovaries, adnexal regions, and pelvic cul-de-sac. It was necessary to proceed with endovaginal exam following the transabdominal exam to visualize the endometrium and ovaries. Color and duplex Doppler ultrasound was utilized to evaluate blood flow to the ovaries. COMPARISON:  CT abdomen pelvis dated 04/24/2023. FINDINGS: Uterus Measurements: 10.0 x 5.3 x 7.3 cm = volume: 200 mL. The uterus is heterogeneous with findings suggestive of adenomyosis. Endometrium Thickness: 14 mm.  No focal abnormality visualized. Right ovary Measurements: 2.5 x 2.2 x 1.9 cm = volume: 5.3 mL. Normal appearance/no adnexal mass. Left ovary Measurements: 4.1 x 2.2 x 2.5 cm = volume: 11.7 mL. A 2 cm somewhat thick walled cystic structure appears to be within the ovary and likely represents a corpus luteum. The possibility of an ectopic pregnancy is less likely as this appears to be within the ovary and not separate from the ovary. Correlation with pregnancy test recommended. Pulsed Doppler evaluation of both ovaries demonstrates normal low-resistance arterial and venous waveforms. Other findings No abnormal free fluid. IMPRESSION: 1. A 2 cm left ovarian corpus luteum. 2. Doppler detected flow to both ovaries. 3. Probable mild adenomyosis. Electronically Signed   By: Elgie Collard M.D.   On: 04/24/2023 20:19   US Transvaginal Non-OB  Result Date: 04/24/2023 CLINICAL DATA:  Left lower quadrant abdominal  pain. Rule out torsion. EXAM: TRANSABDOMINAL AND TRANSVAGINAL ULTRASOUND OF PELVIS DOPPLER ULTRASOUND OF OVARIES TECHNIQUE: Both transabdominal and transvaginal ultrasound examinations of the pelvis were performed. Transabdominal technique was performed for global imaging of the pelvis  including uterus, ovaries, adnexal regions, and pelvic cul-de-sac. It was necessary to proceed with endovaginal exam following the transabdominal exam to visualize the endometrium and ovaries. Color and duplex Doppler ultrasound was utilized to evaluate blood flow to the ovaries. COMPARISON:  CT abdomen pelvis dated 04/24/2023. FINDINGS: Uterus Measurements: 10.0 x 5.3 x 7.3 cm = volume: 200 mL. The uterus is heterogeneous with findings suggestive of adenomyosis. Endometrium Thickness: 14 mm.  No focal abnormality visualized. Right ovary Measurements: 2.5 x 2.2 x 1.9 cm = volume: 5.3 mL. Normal appearance/no adnexal mass. Left ovary Measurements: 4.1 x 2.2 x 2.5 cm = volume: 11.7 mL. A 2 cm somewhat thick walled cystic structure appears to be within the ovary and likely represents a corpus luteum. The possibility of an ectopic pregnancy is less likely as this appears to be within the ovary and not separate from the ovary. Correlation with pregnancy test recommended. Pulsed Doppler evaluation of both ovaries demonstrates normal low-resistance arterial and venous waveforms. Other findings No abnormal free fluid. IMPRESSION: 1. A 2 cm left ovarian corpus luteum. 2. Doppler detected flow to both ovaries. 3. Probable mild adenomyosis. Electronically Signed   By: Elgie Collard M.D.   On: 04/24/2023 20:19   [place] Art/Ven Flow Abd Pelv Doppler  Result Date: 04/24/2023 CLINICAL DATA:  Left lower quadrant abdominal pain. Rule out torsion. EXAM: TRANSABDOMINAL AND TRANSVAGINAL ULTRASOUND OF PELVIS DOPPLER ULTRASOUND OF OVARIES TECHNIQUE: Both transabdominal and transvaginal ultrasound examinations of the pelvis were performed. Transabdominal technique was performed for global imaging of the pelvis including uterus, ovaries, adnexal regions, and pelvic cul-de-sac. It was necessary to proceed with endovaginal exam following the transabdominal exam to visualize the endometrium and ovaries. Color and duplex Doppler ultrasound  was utilized to evaluate blood flow to the ovaries. COMPARISON:  CT abdomen pelvis dated 04/24/2023. FINDINGS: Uterus Measurements: 10.0 x 5.3 x 7.3 cm = volume: 200 mL. The uterus is heterogeneous with findings suggestive of adenomyosis. Endometrium Thickness: 14 mm.  No focal abnormality visualized. Right ovary Measurements: 2.5 x 2.2 x 1.9 cm = volume: 5.3 mL. Normal appearance/no adnexal mass. Left ovary Measurements: 4.1 x 2.2 x 2.5 cm = volume: 11.7 mL. A 2 cm somewhat thick walled cystic structure appears to be within the ovary and likely represents a corpus luteum. The possibility of an ectopic pregnancy is less likely as this appears to be within the ovary and not separate from the ovary. Correlation with pregnancy test recommended. Pulsed Doppler evaluation of both ovaries demonstrates normal low-resistance arterial and venous waveforms. Other findings No abnormal free fluid. IMPRESSION: 1. A 2 cm left ovarian corpus luteum. 2. Doppler detected flow to both ovaries. 3. Probable mild adenomyosis. Electronically Signed   By: Elgie Collard M.D.   On: 04/24/2023 20:19   US Abdomen Limited RUQ (LIVER/GB)  Result Date: 04/24/2023 CLINICAL DATA:  Right upper quadrant pain EXAM: ULTRASOUND ABDOMEN LIMITED RIGHT UPPER QUADRANT COMPARISON:  CT renal stone 04/24/2023 FINDINGS: Gallbladder: No gallstones or wall thickening visualized. There is trace pericholecystic fluid. No sonographic Murphy sign noted by sonographer. Common bile duct: Diameter: 6.4 mm Liver: No focal lesion identified. Within normal limits in parenchymal echogenicity. Portal vein is patent on color Doppler imaging with normal direction of blood flow towards the liver. Other: None.  IMPRESSION: 1. There is trace pericholecystic fluid. There is no sonographic evidence of acute cholecystitis. 2. Common bile duct measures 6.4 mm which is at the upper limits of normal. Correlation with liver function tests is recommended. If there is clinical  concern for biliary obstruction, MRCP can be obtained for further evaluation. Electronically Signed   By: Darliss Cheney M.D.   On: 04/24/2023 20:02   CT Renal Stone Study  Result Date: 04/24/2023 CLINICAL DATA:  Abdominal/flank pain, stone suspected. Right-sided abdominal pain for 2 hours. EXAM: CT ABDOMEN AND PELVIS WITHOUT CONTRAST TECHNIQUE: Multidetector CT imaging of the abdomen and pelvis was performed following the standard protocol without IV contrast. RADIATION DOSE REDUCTION: This exam was performed according to the departmental dose-optimization program which includes automated exposure control, adjustment of the mA and/or kV according to patient size and/or use of iterative reconstruction technique. COMPARISON:  CT examination dated July 04, 2017. FINDINGS: Lower chest: No acute abnormality. Hepatobiliary: No focal liver abnormality is seen. No gallstones, gallbladder wall thickening, or biliary dilatation. Pancreas: Unremarkable. No pancreatic ductal dilatation or surrounding inflammatory changes. Spleen: Normal in size without focal abnormality. Adrenals/Urinary Tract: Adrenal glands are unremarkable. Kidneys are normal, without renal calculi, focal lesion, or hydronephrosis. Bladder is unremarkable. Stomach/Bowel: Stomach is within normal limits. Appendix appears normal. No evidence of bowel wall thickening, distention, or inflammatory changes. Vascular/Lymphatic: No significant vascular findings are present. No enlarged abdominal or pelvic lymph nodes. Reproductive: Uterus and bilateral adnexa are unremarkable. Bilateral tubal ligation clips. Other: No abdominal wall hernia or abnormality. No abdominopelvic ascites. Musculoskeletal: No acute or significant osseous findings. IMPRESSION: 1. No CT evidence of acute abdominal/pelvic process. 2. No evidence of nephrolithiasis or hydronephrosis. 3. Normal appendix. No evidence of colitis or diverticulitis. Electronically Signed   By: Larose Hires D.O.    On: 04/24/2023 18:46    Scheduled Meds:  fentaNYL (SUBLIMAZE) injection  12.5 mcg Intravenous Once    Continuous Infusions:  sodium chloride 100 mL/hr at 04/25/23 0842   cefTRIAXone (ROCEPHIN)  IV     metronidazole 500 mg (04/25/23 1610)     LOS: 0 days     Briant Cedar, MD [facility]  If 7PM-7AM, please contact night-coverage [email or web address] 04/25/2023, 5:44 PM

## 2023-04-26 ENCOUNTER — Inpatient Hospital Stay (HOSPITAL_COMMUNITY): Payer: Medicaid Other

## 2023-04-26 DIAGNOSIS — K5792 Diverticulitis of intestine, part unspecified, without perforation or abscess without bleeding: Secondary | ICD-10-CM | POA: Diagnosis not present

## 2023-04-26 LAB — CBC WITH DIFFERENTIAL/PLATELET
Abs Immature Granulocytes: 0.02 10*3/uL (ref 0.00–0.07)
Basophils Absolute: 0 10*3/uL (ref 0.0–0.1)
Basophils Relative: 0 %
Eosinophils Absolute: 0.1 10*3/uL (ref 0.0–0.5)
Eosinophils Relative: 2 %
HCT: 30 % — ABNORMAL LOW (ref 36.0–46.0)
Hemoglobin: 9.7 g/dL — ABNORMAL LOW (ref 12.0–15.0)
Immature Granulocytes: 0 %
Lymphocytes Relative: 16 %
Lymphs Abs: 1.3 10*3/uL (ref 0.7–4.0)
MCH: 29.3 pg (ref 26.0–34.0)
MCHC: 32.3 g/dL (ref 30.0–36.0)
MCV: 90.6 fL (ref 80.0–100.0)
Monocytes Absolute: 0.6 10*3/uL (ref 0.1–1.0)
Monocytes Relative: 7 %
Neutro Abs: 5.8 10*3/uL (ref 1.7–7.7)
Neutrophils Relative %: 75 %
Platelets: 114 10*3/uL — ABNORMAL LOW (ref 150–400)
RBC: 3.31 MIL/uL — ABNORMAL LOW (ref 3.87–5.11)
RDW: 13.2 % (ref 11.5–15.5)
WBC: 7.9 10*3/uL (ref 4.0–10.5)
nRBC: 0 % (ref 0.0–0.2)

## 2023-04-26 LAB — MAGNESIUM: Magnesium: 1.7 mg/dL (ref 1.7–2.4)

## 2023-04-26 LAB — BASIC METABOLIC PANEL
Anion gap: 6 (ref 5–15)
BUN: 6 mg/dL (ref 6–20)
CO2: 25 mmol/L (ref 22–32)
Calcium: 8.3 mg/dL — ABNORMAL LOW (ref 8.9–10.3)
Chloride: 104 mmol/L (ref 98–111)
Creatinine, Ser: 0.81 mg/dL (ref 0.44–1.00)
GFR, Estimated: >60 mL/min (ref >60)
Glucose, Bld: 107 mg/dL — ABNORMAL HIGH (ref 70–99)
Potassium: 2.9 mmol/L — ABNORMAL LOW (ref 3.5–5.1)
Sodium: 135 mmol/L (ref 135–145)

## 2023-04-26 MED ORDER — IOHEXOL 9 MG/ML PO SOLN
ORAL | Status: AC
  Filled 2023-04-26: qty 1000

## 2023-04-26 MED ORDER — POTASSIUM CHLORIDE CRYS ER 20 MEQ PO TBCR
40.0000 meq | EXTENDED_RELEASE_TABLET | Freq: Two times a day (BID) | ORAL | Status: AC
  Administered 2023-04-26 (×2): 40 meq via ORAL
  Filled 2023-04-26 (×2): qty 2

## 2023-04-26 MED ORDER — LIP MEDEX EX OINT
TOPICAL_OINTMENT | CUTANEOUS | Status: DC | PRN
  Administered 2023-04-26: 75 via TOPICAL
  Filled 2023-04-26: qty 7

## 2023-04-26 MED ORDER — IOHEXOL 9 MG/ML PO SOLN
500.0000 mL | ORAL | Status: AC
  Administered 2023-04-26 (×2): 500 mL via ORAL

## 2023-04-26 NOTE — Progress Notes (Signed)
Dilaudid administered. Immediately upon administration, pt became friendly, smiling, laughing and conversing.

## 2023-04-26 NOTE — Progress Notes (Signed)
Throughout the afternoon pt has been very agitated/angry. Pt has threatened to leave AMA if she doesn't get the attention and care she is requesting. Pt has remarked several times about the morphine being DC last night. Pt did choose to wait on dilaudid as noted in the previous RN progress note until after th eCT scan. CT was aware of pt starting the oral contrast at 1544 and having completed the oral contrast since. Pt now states she is no longer feeling pain because "I am that mad." Pt does not appear to have s/s of pain upon assessment. At times, pt refuses to acknowledge RN. Offered to have a new RN assigned to her, pt declined.Pt now requesting dilaudid stating she is in excruciating ABD pain. Again, no s/s of pain upon assessment noted. Although, pain medication is currently being administered as requested and portrayed. Dr Sharolyn Douglas notified earlier this afternoon and Ify, charge RN, aware of issue. RN has tried to accommodate pt as much as possible and allowed by pt throughout the shift. Will continue to provide optimal care as allowed.

## 2023-04-26 NOTE — Progress Notes (Signed)
Educated pt on oral contrast and CT order. Pt verbalized understanding. Pt drinking the first bottle at this time.   Pt requesting dilaudid for pain. Educated pt on the need to stay awake until the 2 bottles of contrast have been completed. Pt declined dilaudid at this time knowing she falls asleep quickly once administered. Pt stated "I will wait until I am finished with this."

## 2023-04-26 NOTE — Progress Notes (Addendum)
PROGRESS NOTE  Jaime Mendoza UEA:[id number] DOB: 12/21/1990 DOA: 04/24/2023 PCP: Patient, No Pcp Per  HPI/Recap of past 24 hours: Jaime Mendoza is a 32 y.o. female with medical history significant of asthma, anxiety, STD who presents with right sided abdominal pain with an episode of vomiting X 1 day. No diarrhea. Denies dysuria, but has felt increase frequency. Has hx of C-section. Does not take any medications on regular basis. Only has occasional alcohol use. Not currently on her menstrual cycle. In the ED, she was afebrile, normotensive on room air. UA with positive leukocytes, negative nitrate, >50 WBC Negative wet prep for trichomonas, yeast or clue cells. Extensive imaging with CT renal and CTA chest/abd/pelvis showed acute ascending diverticulitis without perforation or abscess. Pelvic USS showed 2cm left ovarian corpus luteum, mild adenomyosis. RUQ ultrasound negative.  Patient admitted for further management.    Today, patient complaining about persistent abdominal pain, reported nausea/vomiting overnight although no nursing staff witnessed any.  ??Seems to be possibly exaggerating her pain.  Vitals and labs stable.  Repeat CT abdomen pending.  Patient very upset as IV morphine was discontinued.  Patient already had IV Dilaudid as well on board.   Assessment/Plan: Principal Problem:   Acute pyelonephritis Active Problems:   Acute diverticulitis  Acute diverticulitis Currently afebrile, with leukocytosis CT abdomen showed acute ascending colonic diverticulitis, repeat pending due to persistent abdominal pain Full liquid diet, IV fluid IV Rocephin and Flagyl Pain management-appears to be exaggerated versus low pain threshold Strict bowel regimen  Unlikely UTI UA with large leukocytes, negative bacteria, WBC greater than 50, with significant squamous epithelial cells noted in a dirty collection On Rocephin  Hypokalemia Replace as needed    Estimated body mass index is  15.62 kg/m as calculated from the following:   Height as of this encounter: 5\' 11"  (1.803 m).   Weight as of this encounter: 50.8 kg.     Code Status: Full  Family Communication: None at bedside  Disposition Plan: Status is: Inpatient Remains inpatient appropriate because: Level of care      Consultants: None  Procedures: None  Antimicrobials: Rocephin Flagyl  DVT prophylaxis: SCDs   Objective: Vitals:   04/25/23 1005 04/25/23 1229 04/25/23 2046 04/26/23 0536  BP: 108/67 114/75 125/83 (!) 103/53  Pulse: 75 78 80 69  Resp: (!) 24 20 16 16   Temp: 99 F (37.2 C) 98.9 F (37.2 C) 99 F (37.2 C) 98.8 F (37.1 C)  TempSrc: Oral Oral Oral Oral  SpO2: 100% 100% 100% 100%  Weight:      Height:        Intake/Output Summary (Last 24 hours) at 04/26/2023 1723 Last data filed at 04/26/2023 0500 Gross per 24 hour  Intake 1812.09 ml  Output 1200 ml  Net 612.09 ml   Filed Weights   04/24/23 1700  Weight: 50.8 kg    Exam: General: NAD, thin Cardiovascular: S1, S2 present Respiratory: CTAB Abdomen: Soft, tender, nondistended, bowel sounds present Musculoskeletal: No bilateral pedal edema noted Skin: Normal Psychiatry: Normal mood     Data Reviewed: CBC: Recent Labs  Lab 04/24/23 1721 04/24/23 1727 04/25/23 0048 04/26/23 0522  WBC 10.6*  --  13.1* 7.9  NEUTROABS 8.1*  --   --  5.8  HGB 11.5* 11.6* 10.9* 9.7*  HCT 34.4* 34.0* 34.4* 30.0*  MCV 88.9  --  93.0 90.6  PLT 155  --  130* 114*   Basic Metabolic Panel: Recent Labs  Lab 04/24/23 1721 04/24/23  1727 04/26/23 0522  NA 135 138 135  K 3.5 3.6 2.9*  CL 107 105 104  CO2 20*  --  25  GLUCOSE 98 96 107*  BUN 11 9 6   CREATININE 0.79 0.70 0.81  CALCIUM 9.1  --  8.3*  MG  --   --  1.7   GFR: Estimated Creatinine Clearance: 79.2 mL/min (by C-G formula based on SCr of 0.81 mg/dL). Liver Function Tests: Recent Labs  Lab 04/24/23 1721  AST 20  ALT 13  ALKPHOS 42  BILITOT 0.7  PROT 7.1   ALBUMIN 3.9   No results for input(s): "LIPASE", "AMYLASE" in the last 168 hours. No results for input(s): "AMMONIA" in the last 168 hours. Coagulation Profile: No results for input(s): "INR", "PROTIME" in the last 168 hours. Cardiac Enzymes: No results for input(s): "CKTOTAL", "CKMB", "CKMBINDEX", "TROPONINI" in the last 168 hours. BNP (last 3 results) No results for input(s): "PROBNP" in the last 8760 hours. HbA1C: No results for input(s): "HGBA1C" in the last 72 hours. CBG: No results for input(s): "GLUCAP" in the last 168 hours. Lipid Profile: No results for input(s): "CHOL", "HDL", "LDLCALC", "TRIG", "CHOLHDL", "LDLDIRECT" in the last 72 hours. Thyroid Function Tests: No results for input(s): "TSH", "T4TOTAL", "FREET4", "T3FREE", "THYROIDAB" in the last 72 hours. Anemia Panel: No results for input(s): "VITAMINB12", "FOLATE", "FERRITIN", "TIBC", "IRON", "RETICCTPCT" in the last 72 hours. Urine analysis:    Component Value Date/Time   COLORURINE YELLOW 04/24/2023 1713   APPEARANCEUR CLOUDY (A) 04/24/2023 1713   LABSPEC 1.014 04/24/2023 1713   PHURINE 7.0 04/24/2023 1713   GLUCOSEU NEGATIVE 04/24/2023 1713   HGBUR NEGATIVE 04/24/2023 1713   BILIRUBINUR NEGATIVE 04/24/2023 1713   BILIRUBINUR negative 07/19/2016 1636   KETONESUR NEGATIVE 04/24/2023 1713   PROTEINUR NEGATIVE 04/24/2023 1713   UROBILINOGEN 1.0 09/12/2022 1051   NITRITE NEGATIVE 04/24/2023 1713   LEUKOCYTESUR LARGE (A) 04/24/2023 1713   Sepsis Labs: @LABRCNTIP (procalcitonin:4,lacticidven:4)  ) Recent Results (from the past 240 hour(s))  Wet prep, genital     Status: None   Collection Time: 04/24/23  7:14 PM  Result Value Ref Range Status   Yeast Wet Prep HPF POC NONE SEEN NONE SEEN Final   Trich, Wet Prep NONE SEEN NONE SEEN Final   Clue Cells Wet Prep HPF POC NONE SEEN NONE SEEN Final   WBC, Wet Prep HPF POC <10 <10 Final   Sperm NONE SEEN  Final    Comment: Performed at [facility], [address], [place], [place] [address]      Studies: No results found.  Scheduled Meds:  polyethylene glycol  17 g Oral Daily   potassium chloride  40 mEq Oral BID   senna-docusate  1 tablet Oral BID    Continuous Infusions:  cefTRIAXone (ROCEPHIN)  IV 2 g (04/25/23 2016)   metronidazole 500 mg (04/26/23 1015)     LOS: 1 day     Briant Cedar, MD [facility]  If 7PM-7AM, please contact night-coverage [email or web address] 04/26/2023, 5:23 PM

## 2023-04-27 DIAGNOSIS — E440 Moderate protein-calorie malnutrition: Secondary | ICD-10-CM | POA: Insufficient documentation

## 2023-04-27 DIAGNOSIS — N10 Acute tubulo-interstitial nephritis: Secondary | ICD-10-CM | POA: Diagnosis not present

## 2023-04-27 LAB — BASIC METABOLIC PANEL
Anion gap: 8 (ref 5–15)
BUN: <5 mg/dL — ABNORMAL LOW (ref 6–20)
CO2: 23 mmol/L (ref 22–32)
Calcium: 8.7 mg/dL — ABNORMAL LOW (ref 8.9–10.3)
Chloride: 104 mmol/L (ref 98–111)
Creatinine, Ser: 0.78 mg/dL (ref 0.44–1.00)
GFR, Estimated: >60 mL/min (ref >60)
Glucose, Bld: 92 mg/dL (ref 70–99)
Potassium: 3.7 mmol/L (ref 3.5–5.1)
Sodium: 135 mmol/L (ref 135–145)

## 2023-04-27 LAB — CBC WITH DIFFERENTIAL/PLATELET
Abs Immature Granulocytes: 0.02 10*3/uL (ref 0.00–0.07)
Basophils Absolute: 0 10*3/uL (ref 0.0–0.1)
Basophils Relative: 0 %
Eosinophils Absolute: 0.2 10*3/uL (ref 0.0–0.5)
Eosinophils Relative: 3 %
HCT: 32.9 % — ABNORMAL LOW (ref 36.0–46.0)
Hemoglobin: 10.7 g/dL — ABNORMAL LOW (ref 12.0–15.0)
Immature Granulocytes: 0 %
Lymphocytes Relative: 16 %
Lymphs Abs: 1.1 10*3/uL (ref 0.7–4.0)
MCH: 29.2 pg (ref 26.0–34.0)
MCHC: 32.5 g/dL (ref 30.0–36.0)
MCV: 89.6 fL (ref 80.0–100.0)
Monocytes Absolute: 0.7 10*3/uL (ref 0.1–1.0)
Monocytes Relative: 11 %
Neutro Abs: 4.9 10*3/uL (ref 1.7–7.7)
Neutrophils Relative %: 70 %
Platelets: 140 10*3/uL — ABNORMAL LOW (ref 150–400)
RBC: 3.67 MIL/uL — ABNORMAL LOW (ref 3.87–5.11)
RDW: 13.2 % (ref 11.5–15.5)
WBC: 7 10*3/uL (ref 4.0–10.5)
nRBC: 0 % (ref 0.0–0.2)

## 2023-04-27 MED ORDER — ENSURE ENLIVE PO LIQD
237.0000 mL | Freq: Two times a day (BID) | ORAL | Status: DC
  Administered 2023-04-28: 237 mL via ORAL

## 2023-04-27 MED ORDER — HYDROCODONE-ACETAMINOPHEN 5-325 MG PO TABS
1.0000 | ORAL_TABLET | ORAL | Status: DC | PRN
  Administered 2023-04-27 (×2): 2 via ORAL
  Filled 2023-04-27 (×2): qty 2

## 2023-04-27 MED ORDER — POLYETHYLENE GLYCOL 3350 17 G PO PACK: 17.0000 g | PACK | Freq: Two times a day (BID) | ORAL | Status: DC

## 2023-04-27 MED ORDER — HYDROMORPHONE HCL 1 MG/ML IJ SOLN: 1.0000 mg | Freq: Once | INTRAMUSCULAR | Status: DC | PRN

## 2023-04-27 MED ORDER — ONDANSETRON HCL 4 MG PO TABS
4.0000 mg | ORAL_TABLET | Freq: Three times a day (TID) | ORAL | Status: DC | PRN
  Administered 2023-04-27: 4 mg via ORAL
  Filled 2023-04-27: qty 1

## 2023-04-27 MED ORDER — AMOXICILLIN-POT CLAVULANATE 875-125 MG PO TABS
1.0000 | ORAL_TABLET | Freq: Two times a day (BID) | ORAL | Status: DC
  Administered 2023-04-27 – 2023-04-28 (×2): 1 via ORAL
  Filled 2023-04-27 (×2): qty 1

## 2023-04-27 NOTE — Progress Notes (Signed)
PROGRESS NOTE  Jaime Mendoza ZOX:[id number] DOB: 12-22-1990 DOA: 04/24/2023 PCP: Patient, No Pcp Per  HPI/Recap of past 24 hours: Jaime Mendoza is a 31 y.o. female with medical history significant of asthma, anxiety, STD who presents with right sided abdominal pain with an episode of vomiting X 1 day. No diarrhea. Denies dysuria, but has felt increase frequency. Has hx of C-section. Does not take any medications on regular basis. Only has occasional alcohol use. Not currently on her menstrual cycle. In the ED, she was afebrile, normotensive on room air. UA with positive leukocytes, negative nitrate, >50 WBC Negative wet prep for trichomonas, yeast or clue cells. Extensive imaging with CT renal and CTA chest/abd/pelvis showed acute ascending diverticulitis without perforation or abscess. Pelvic USS showed 2cm left ovarian corpus luteum, mild adenomyosis. RUQ ultrasound negative.  Patient admitted for further management.    Today, patient still complaining of abdominal pain but reports it is improving.  Had an extensive and detailed conversation with patient about overall care.  Discussed about advancing her diet, switching to p.o. pain meds/p.o. antibiotics, and monitoring her for another day and if remains stable, plan for discharge   Assessment/Plan: Principal Problem:   Acute pyelonephritis Active Problems:   Acute diverticulitis   Malnutrition of moderate degree   Acute diverticulitis Currently afebrile, with resolved leukocytosis CT abdomen showed acute ascending colonic diverticulitis, repeat without any perforation or abscess noted  Advance diet to soft S/p IV Rocephin and Flagyl--> switched to p.o. Augmentin to complete 7 days Pain management-appears to be exaggerated versus low pain threshold Strict bowel regimen  Unlikely UTI UA with large leukocytes, negative bacteria, WBC greater than 50, with significant squamous epithelial cells noted in a dirty collection Received  at least x 3 days of IV Rocephin  Hypokalemia Replace as needed  Nutrition consulted Estimated body mass index is 15.62 kg/m as calculated from the following:   Height as of this encounter: 5\' 11"  (1.803 m).   Weight as of this encounter: 50.8 kg.     Code Status: Full  Family Communication: None at bedside  Disposition Plan: Status is: Inpatient Remains inpatient appropriate because: Level of care      Consultants: None  Procedures: None  Antimicrobials: Augmentin  DVT prophylaxis: SCDs   Objective: Vitals:   04/26/23 2119 04/26/23 2119 04/27/23 0527 04/27/23 1235  BP: (!) 133/102 (!) 133/102 130/81 (!) 104/56  Pulse: 77 77 81 64  Resp: 20 20 16 16   Temp: 98.2 F (36.8 C) 98.2 F (36.8 C) 99 F (37.2 C) 99 F (37.2 C)  TempSrc: Oral Oral Oral Oral  SpO2: 100% 100% 99% 99%  Weight:      Height:        Intake/Output Summary (Last 24 hours) at 04/27/2023 1808 Last data filed at 04/27/2023 0500 Gross per 24 hour  Intake --  Output 1700 ml  Net -1700 ml   Filed Weights   04/24/23 1700  Weight: 50.8 kg    Exam: General: NAD, thin Cardiovascular: S1, S2 present Respiratory: CTAB Abdomen: Soft, tender, nondistended, bowel sounds present Musculoskeletal: No bilateral pedal edema noted Skin: Normal Psychiatry: Normal mood     Data Reviewed: CBC: Recent Labs  Lab 04/24/23 1721 04/24/23 1727 04/25/23 0048 04/26/23 0522 04/27/23 0507  WBC 10.6*  --  13.1* 7.9 7.0  NEUTROABS 8.1*  --   --  5.8 4.9  HGB 11.5* 11.6* 10.9* 9.7* 10.7*  HCT 34.4* 34.0* 34.4* 30.0* 32.9*  MCV 88.9  --  93.0 90.6 89.6  PLT 155  --  130* 114* 140*   Basic Metabolic Panel: Recent Labs  Lab 04/24/23 1721 04/24/23 1727 04/26/23 0522 04/27/23 0507  NA 135 138 135 135  K 3.5 3.6 2.9* 3.7  CL 107 105 104 104  CO2 20*  --  25 23  GLUCOSE 98 96 107* 92  BUN 11 9 6  <5*  CREATININE 0.79 0.70 0.81 0.78  CALCIUM 9.1  --  8.3* 8.7*  MG  --   --  1.7  --     GFR: Estimated Creatinine Clearance: 80.2 mL/min (by C-G formula based on SCr of 0.78 mg/dL). Liver Function Tests: Recent Labs  Lab 04/24/23 1721  AST 20  ALT 13  ALKPHOS 42  BILITOT 0.7  PROT 7.1  ALBUMIN 3.9   No results for input(s): "LIPASE", "AMYLASE" in the last 168 hours. No results for input(s): "AMMONIA" in the last 168 hours. Coagulation Profile: No results for input(s): "INR", "PROTIME" in the last 168 hours. Cardiac Enzymes: No results for input(s): "CKTOTAL", "CKMB", "CKMBINDEX", "TROPONINI" in the last 168 hours. BNP (last 3 results) No results for input(s): "PROBNP" in the last 8760 hours. HbA1C: No results for input(s): "HGBA1C" in the last 72 hours. CBG: No results for input(s): "GLUCAP" in the last 168 hours. Lipid Profile: No results for input(s): "CHOL", "HDL", "LDLCALC", "TRIG", "CHOLHDL", "LDLDIRECT" in the last 72 hours. Thyroid Function Tests: No results for input(s): "TSH", "T4TOTAL", "FREET4", "T3FREE", "THYROIDAB" in the last 72 hours. Anemia Panel: No results for input(s): "VITAMINB12", "FOLATE", "FERRITIN", "TIBC", "IRON", "RETICCTPCT" in the last 72 hours. Urine analysis:    Component Value Date/Time   COLORURINE YELLOW 04/24/2023 1713   APPEARANCEUR CLOUDY (A) 04/24/2023 1713   LABSPEC 1.014 04/24/2023 1713   PHURINE 7.0 04/24/2023 1713   GLUCOSEU NEGATIVE 04/24/2023 1713   HGBUR NEGATIVE 04/24/2023 1713   BILIRUBINUR NEGATIVE 04/24/2023 1713   BILIRUBINUR negative 07/19/2016 1636   KETONESUR NEGATIVE 04/24/2023 1713   PROTEINUR NEGATIVE 04/24/2023 1713   UROBILINOGEN 1.0 09/12/2022 1051   NITRITE NEGATIVE 04/24/2023 1713   LEUKOCYTESUR LARGE (A) 04/24/2023 1713   Sepsis Labs: @LABRCNTIP (procalcitonin:4,lacticidven:4)  ) Recent Results (from the past 240 hour(s))  Wet prep, genital     Status: None   Collection Time: 04/24/23  7:14 PM  Result Value Ref Range Status   Yeast Wet Prep HPF POC NONE SEEN NONE SEEN Final   Trich,  Wet Prep NONE SEEN NONE SEEN Final   Clue Cells Wet Prep HPF POC NONE SEEN NONE SEEN Final   WBC, Wet Prep HPF POC <10 <10 Final   Sperm NONE SEEN  Final    Comment: Performed at [facility], [address] [place], [place] [address]      Studies: CT ABDOMEN PELVIS WO CONTRAST  Result Date: 04/26/2023 CLINICAL DATA:  Diverticulitis.  Abdominal pain and nausea vomiting. EXAM: CT ABDOMEN AND PELVIS WITHOUT CONTRAST TECHNIQUE: Multidetector CT imaging of the abdomen and pelvis was performed following the standard protocol without IV contrast. RADIATION DOSE REDUCTION: This exam was performed according to the departmental dose-optimization program which includes automated exposure control, adjustment of the mA and/or kV according to patient size and/or use of iterative reconstruction technique. COMPARISON:  CT abdomen pelvis dated 04/24/2023. FINDINGS: Evaluation of this exam is limited in the absence of intravenous contrast. Lower chest: Trace bilateral pleural effusions. The visualized lung bases are otherwise clear. No intra-abdominal free air or free fluid. Hepatobiliary: The liver is  unremarkable. No biliary dilatation. The gallbladder is unremarkable. Pancreas: Unremarkable. No pancreatic ductal dilatation or surrounding inflammatory changes. Spleen: Normal in size without focal abnormality. Adrenals/Urinary Tract: The adrenal glands are unremarkable. There is no hydronephrosis or nephrolithiasis on either side. The visualized ureters and urinary bladder appear unremarkable. Stomach/Bowel: There is inflammatory changes centered at a diverticula in the proximal ascending colon as seen on the prior CT consistent with acute diverticulitis. No diverticular abscess or perforation. There is no bowel obstruction. The appendix is normal. Vascular/Lymphatic: The abdominal aorta and IVC are grossly unremarkable on this noncontrast CT. No portal venous gas. There is no adenopathy.  Reproductive: The uterus is grossly unremarkable. No adnexal masses. Bilateral tubal ligation clips. Other: Small fat containing umbilical hernia. Musculoskeletal: No acute or significant osseous findings. IMPRESSION: 1. Acute diverticulitis of the proximal ascending colon. No diverticular abscess or perforation. 2. No hydronephrosis or nephrolithiasis. 3. Trace bilateral pleural effusions. Electronically Signed   By: Elgie Collard M.D.   On: 04/26/2023 21:07    Scheduled Meds:  amoxicillin-clavulanate  1 tablet Oral Q12H   feeding supplement  237 mL Oral BID BM   polyethylene glycol  17 g Oral BID   senna-docusate  1 tablet Oral BID    Continuous Infusions:     LOS: 2 days     Briant Cedar, MD [facility]  If 7PM-7AM, please contact night-coverage [email or web address] 04/27/2023, 6:08 PM

## 2023-04-27 NOTE — Progress Notes (Signed)
Initial Nutrition Assessment  DOCUMENTATION CODES:   Non-severe (moderate) malnutrition in context of acute illness/injury, Underweight  INTERVENTION:   -Ensure Plus High Protein po BID, each supplement provides 350 kcal and 20 grams of protein.   -Will provide Low Fiber and High Fiber diet handouts in AVS. Reviewed low fiber diet with patient and then recommended to slowly incorporate fiber back into the diet again for long-term management of diverticulitis.  -Needs updated weight for admission  NUTRITION DIAGNOSIS:   Moderate Malnutrition related to acute illness (diverticulitis) as evidenced by mild fat depletion, severe muscle depletion.  GOAL:   Patient will meet greater than or equal to 90% of their needs  MONITOR:   PO intake, Supplement acceptance, Labs, Weight trends, I & O's  REASON FOR ASSESSMENT:    (Low BMI)    ASSESSMENT:   32 y.o. female with medical history significant of asthma, anxiety, STD who presents with right sided abdominal pain with an episode of vomiting X 1 day. Admitted with acute diverticulitis.  Patient in room, states she just received some pain medication. Pt initially upset that her diet is just liquids. States she has been refusing them as she wants regular food. Pt then later states she has been sipping on soda and other juices as well as Ensure. RD brought pt an Ensure to sip on today. Pt states she feels really hungry, RD concerned this is negatively impacting her mood. Encouraged pt to try liquids again to see if she has pain and then diet can be advanced to soft. Pt plans to talk to MD about it.  We discussed low fiber diet. Will provide handouts for pt to have following discharge.   Patient reports she has always been smaller but she can tell she has lost weight since admission.  Per weight records, same weight recorded since 07/27/19. Likely not an accurate weight. Will need updated weight for admission.   Medications: Miralax,  Senokot  Labs reviewed.  NUTRITION - FOCUSED PHYSICAL EXAM:  Flowsheet Row Most Recent Value  Orbital Region Mild depletion  Upper Arm Region Severe depletion  Thoracic and Lumbar Region Unable to assess  Buccal Region Mild depletion  Temple Region Mild depletion  Clavicle Bone Region Severe depletion  Clavicle and Acromion Bone Region Severe depletion  Scapular Bone Region Severe depletion  Dorsal Hand Mild depletion  Patellar Region Unable to assess  Anterior Thigh Region Unable to assess  Posterior Calf Region Unable to assess  Edema (RD Assessment) None  Hair Unable to assess  [covered]  Eyes Reviewed  Mouth Reviewed  Skin Reviewed       Diet Order:   Diet Order             DIET SOFT Room service appropriate? Yes; Fluid consistency: Thin  Diet effective now                   EDUCATION NEEDS:   Education needs have been addressed  Skin:  Skin Assessment: Reviewed RN Assessment  Last BM:  5/23  Height:   Ht Readings from Last 1 Encounters:  04/24/23 5\' 11"  (1.803 m)    Weight:   Wt Readings from Last 1 Encounters:  04/24/23 50.8 kg    BMI:  Body mass index is 15.62 kg/m.  Estimated Nutritional Needs:   Kcal:  2100-2300  Protein:  100-115g  Fluid:  2.1L/day  Tilda Franco, MS, RD, LDN Inpatient Clinical Dietitian Contact information available via Amion

## 2023-04-27 NOTE — Progress Notes (Signed)
Entered pt room for reassessment. Pt appeared to be in a good mood while laughing, smiling and conversing. Pt denied any needs a this time other than educating on ordering meal. Pt verbalized understanding. Pt did receive a meal; although, the food was not what pt wanted. Tray removed from room. Pt on the phone ordering a replacement. No s/s of pain. Pt did not report pain.

## 2023-04-27 NOTE — Discharge Instructions (Signed)

## 2023-04-28 DIAGNOSIS — N10 Acute tubulo-interstitial nephritis: Secondary | ICD-10-CM | POA: Diagnosis not present

## 2023-04-28 LAB — CBC WITH DIFFERENTIAL/PLATELET
Abs Immature Granulocytes: 0.01 10*3/uL (ref 0.00–0.07)
Basophils Absolute: 0 10*3/uL (ref 0.0–0.1)
Basophils Relative: 0 %
Eosinophils Absolute: 0.2 10*3/uL (ref 0.0–0.5)
Eosinophils Relative: 5 %
HCT: 34.1 % — ABNORMAL LOW (ref 36.0–46.0)
Hemoglobin: 11.3 g/dL — ABNORMAL LOW (ref 12.0–15.0)
Immature Granulocytes: 0 %
Lymphocytes Relative: 22 %
Lymphs Abs: 1.1 10*3/uL (ref 0.7–4.0)
MCH: 29.6 pg (ref 26.0–34.0)
MCHC: 33.1 g/dL (ref 30.0–36.0)
MCV: 89.3 fL (ref 80.0–100.0)
Monocytes Absolute: 0.7 10*3/uL (ref 0.1–1.0)
Monocytes Relative: 14 %
Neutro Abs: 2.8 10*3/uL (ref 1.7–7.7)
Neutrophils Relative %: 59 %
Platelets: 161 10*3/uL (ref 150–400)
RBC: 3.82 MIL/uL — ABNORMAL LOW (ref 3.87–5.11)
RDW: 13.2 % (ref 11.5–15.5)
WBC: 4.8 10*3/uL (ref 4.0–10.5)
nRBC: 0 % (ref 0.0–0.2)

## 2023-04-28 LAB — BASIC METABOLIC PANEL
Anion gap: 8 (ref 5–15)
BUN: 8 mg/dL (ref 6–20)
CO2: 24 mmol/L (ref 22–32)
Calcium: 8.8 mg/dL — ABNORMAL LOW (ref 8.9–10.3)
Chloride: 104 mmol/L (ref 98–111)
Creatinine, Ser: 0.81 mg/dL (ref 0.44–1.00)
GFR, Estimated: >60 mL/min (ref >60)
Glucose, Bld: 95 mg/dL (ref 70–99)
Potassium: 3.4 mmol/L — ABNORMAL LOW (ref 3.5–5.1)
Sodium: 136 mmol/L (ref 135–145)

## 2023-04-28 MED ORDER — POLYETHYLENE GLYCOL 3350 17 G PO PACK: 17.0000 g | PACK | Freq: Every day | ORAL | 0 refills | Status: AC | PRN

## 2023-04-28 MED ORDER — ONDANSETRON HCL 4 MG PO TABS: 4.0000 mg | ORAL_TABLET | Freq: Three times a day (TID) | ORAL | 0 refills | Status: AC | PRN

## 2023-04-28 MED ORDER — HYDROCODONE-ACETAMINOPHEN 5-325 MG PO TABS: 1.0000 | ORAL_TABLET | Freq: Four times a day (QID) | ORAL | 0 refills | Status: AC | PRN

## 2023-04-28 MED ORDER — AMOXICILLIN-POT CLAVULANATE 875-125 MG PO TABS: 1.0000 | ORAL_TABLET | Freq: Two times a day (BID) | ORAL | 0 refills | Status: AC

## 2023-04-28 NOTE — Plan of Care (Signed)

## 2023-04-28 NOTE — Progress Notes (Signed)
Patient given discharge, medication, and follow up instructions, verbalized understanding, no IV or telemetry  monitor, personal belonging with patient, to be transported home per friend

## 2023-04-28 NOTE — Discharge Summary (Signed)
Physician Discharge Summary   Patient: Jaime Mendoza MRN: [id number] DOB: [birth year]  Admit date:     04/24/2023  Discharge date: 04/28/23  Discharge Physician: Briant Cedar   PCP: Patient, No Pcp Per   Recommendations at discharge:   Establish care with PCP  Discharge Diagnoses: Principal Problem:   Acute pyelonephritis Active Problems:   Acute diverticulitis   Malnutrition of moderate degree    Hospital Course: Jaime Mendoza is a 32 y.o. female with medical history significant of asthma, anxiety, STD who presents with right sided abdominal pain with an episode of vomiting X 1 day. No diarrhea. Denies dysuria, but has felt increase frequency. Has hx of C-section. Does not take any medications on regular basis. Only has occasional alcohol use. Not currently on her menstrual cycle. In the ED, she was afebrile, normotensive on room air. UA with positive leukocytes, negative nitrate, >50 WBC Negative wet prep for trichomonas, yeast or clue cells. Extensive imaging with CT renal and CTA chest/abd/pelvis showed acute ascending diverticulitis without perforation or abscess. Pelvic USS showed 2cm left ovarian corpus luteum, mild adenomyosis. RUQ ultrasound negative.  Patient admitted for further management.    Today, patient denies any further abdominal pain, nausea/vomiting, able to tolerate a diet.  Eager to be discharged.   Assessment and Plan: Acute diverticulitis Currently afebrile, with resolved leukocytosis CT abdomen showed acute ascending colonic diverticulitis, repeat without any perforation or abscess noted  Advanced diet S/p IV Rocephin and Flagyl--> switched to p.o. Augmentin to complete 7 days Pain management Strict bowel regimen   Unlikely UTI UA with large leukocytes, negative bacteria, WBC greater than 50, with significant squamous epithelial cells noted in a dirty collection Received x 3 days of IV Rocephin   Hypokalemia Replaced as needed    Nutrition consulted Estimated body mass index is 15.62 kg/m as calculated from the following:   Height as of this encounter: 5\' 11"  (1.803 m).   Weight as of this encounter: 50.8 kg.       Consultants: None Procedures performed: None Disposition: Home Diet recommendation: Regular Regular diet   DISCHARGE MEDICATION: Allergies as of 04/28/2023       Reactions   Percocet [oxycodone-acetaminophen] Itching        Medication List     STOP taking these medications    metroNIDAZOLE 500 MG tablet Commonly known as: FLAGYL       TAKE these medications    amoxicillin-clavulanate 875-125 MG tablet Commonly known as: AUGMENTIN Take 1 tablet by mouth every 12 (twelve) hours for 2 days.   HYDROcodone-acetaminophen 5-325 MG tablet Commonly known as: NORCO/VICODIN Take 1 tablet by mouth every 6 (six) hours as needed for up to 3 days for moderate pain.   ondansetron 4 MG tablet Commonly known as: ZOFRAN Take 1 tablet (4 mg total) by mouth every 8 (eight) hours as needed for up to 3 days for nausea or vomiting.   polyethylene glycol 17 g packet Commonly known as: MIRALAX / GLYCOLAX Take 17 g by mouth daily as needed for up to 7 days.        Discharge Exam: Filed Weights   04/24/23 1700  Weight: 50.8 kg   General: NAD  Cardiovascular: S1, S2 present Respiratory: CTAB Abdomen: Soft, nontender, nondistended, bowel sounds present Musculoskeletal: No bilateral pedal edema noted Skin: Normal Psychiatry: Normal mood   Condition at discharge: stable  The results of significant diagnostics from this hospitalization (including imaging, microbiology, ancillary and laboratory) are listed below  for reference.   Imaging Studies: CT ABDOMEN PELVIS WO CONTRAST  Result Date: 04/26/2023 CLINICAL DATA:  Diverticulitis.  Abdominal pain and nausea vomiting. EXAM: CT ABDOMEN AND PELVIS WITHOUT CONTRAST TECHNIQUE: Multidetector CT imaging of the abdomen and pelvis was performed  following the standard protocol without IV contrast. RADIATION DOSE REDUCTION: This exam was performed according to the departmental dose-optimization program which includes automated exposure control, adjustment of the mA and/or kV according to patient size and/or use of iterative reconstruction technique. COMPARISON:  CT abdomen pelvis dated 04/24/2023. FINDINGS: Evaluation of this exam is limited in the absence of intravenous contrast. Lower chest: Trace bilateral pleural effusions. The visualized lung bases are otherwise clear. No intra-abdominal free air or free fluid. Hepatobiliary: The liver is unremarkable. No biliary dilatation. The gallbladder is unremarkable. Pancreas: Unremarkable. No pancreatic ductal dilatation or surrounding inflammatory changes. Spleen: Normal in size without focal abnormality. Adrenals/Urinary Tract: The adrenal glands are unremarkable. There is no hydronephrosis or nephrolithiasis on either side. The visualized ureters and urinary bladder appear unremarkable. Stomach/Bowel: There is inflammatory changes centered at a diverticula in the proximal ascending colon as seen on the prior CT consistent with acute diverticulitis. No diverticular abscess or perforation. There is no bowel obstruction. The appendix is normal. Vascular/Lymphatic: The abdominal aorta and IVC are grossly unremarkable on this noncontrast CT. No portal venous gas. There is no adenopathy. Reproductive: The uterus is grossly unremarkable. No adnexal masses. Bilateral tubal ligation clips. Other: Small fat containing umbilical hernia. Musculoskeletal: No acute or significant osseous findings. IMPRESSION: 1. Acute diverticulitis of the proximal ascending colon. No diverticular abscess or perforation. 2. No hydronephrosis or nephrolithiasis. 3. Trace bilateral pleural effusions. Electronically Signed   By: Elgie Collard M.D.   On: 04/26/2023 21:07   CT Angio Chest/Abd/Pel for Dissection W and/or Wo  Contrast  Result Date: 04/24/2023 CLINICAL DATA:  Acute aortic syndrome, right abdominal pain EXAM: CT ANGIOGRAPHY CHEST, ABDOMEN AND PELVIS TECHNIQUE: Non-contrast CT of the chest was initially obtained. Multidetector CT imaging through the chest, abdomen and pelvis was performed using the standard protocol during bolus administration of intravenous contrast. Multiplanar reconstructed images and MIPs were obtained and reviewed to evaluate the vascular anatomy. RADIATION DOSE REDUCTION: This exam was performed according to the departmental dose-optimization program which includes automated exposure control, adjustment of the mA and/or kV according to patient size and/or use of iterative reconstruction technique. CONTRAST:  80mL OMNIPAQUE IOHEXOL 350 MG/ML SOLN COMPARISON:  None Available. FINDINGS: CTA CHEST FINDINGS Cardiovascular: Preferential opacification of the thoracic aorta. No evidence of thoracic aortic aneurysm or dissection. The pulmonary arterial tree is adequately opacified through the level of segmental pulmonary arteries and no intraluminal filling defects identified to suggest acute pulmonary embolism. Central pulmonary arteries are of normal caliber. Normal heart size. No pericardial effusion. Mediastinum/Nodes: No enlarged mediastinal, hilar, or axillary lymph nodes. Thyroid gland, trachea, and esophagus demonstrate no significant findings. Lungs/Pleura: Lungs are clear. No pleural effusion or pneumothorax. Musculoskeletal: No chest wall abnormality. No acute or significant osseous findings. Review of the MIP images confirms the above findings. CTA ABDOMEN AND PELVIS FINDINGS VASCULAR Aorta: Normal caliber aorta without aneurysm, dissection, vasculitis or significant stenosis. Celiac: Patent without evidence of aneurysm, dissection, vasculitis or significant stenosis. SMA: Patent without evidence of aneurysm, dissection, vasculitis or significant stenosis. Renals: Both renal arteries are patent  without evidence of aneurysm, dissection, vasculitis, fibromuscular dysplasia or significant stenosis. IMA: Patent without evidence of aneurysm, dissection, vasculitis or significant stenosis. Inflow: Patent without evidence of  aneurysm, dissection, vasculitis or significant stenosis. Veins: No obvious venous abnormality within the limitations of this arterial phase study. Review of the MIP images confirms the above findings. NON-VASCULAR Hepatobiliary: No focal liver abnormality is seen. No gallstones, gallbladder wall thickening, or biliary dilatation. Pancreas: Unremarkable. No pancreatic ductal dilatation or surrounding inflammatory changes. Spleen: Normal in size without focal abnormality. Adrenals/Urinary Tract: Adrenal glands are unremarkable. Kidneys are normal, without renal calculi, focal lesion, or hydronephrosis. Bladder is unremarkable. Stomach/Bowel: There is layering fluid within the right pericolic gutter along the anti mesenteric border of the ascending colon with fluid and mild bowel wall thickening focally surrounding a single diverticulum within the ascending colon at axial image # 204/10 most in keeping with acute ascending diverticulitis. No loculated pericolonic fluid collections are identified. No free intraperitoneal gas. The stomach, small bowel, and large bowel are otherwise unremarkable. No evidence of obstruction. Appendix normal. Lymphatic: No pathologic adenopathy within the abdomen and pelvis. Reproductive: Tubal ligation clips noted within the right adnexa. The pelvic organs are otherwise unremarkable. Other: No abdominal wall hernia. Musculoskeletal: No acute bone abnormality. No lytic or blastic bone lesion. Review of the MIP images confirms the above findings. IMPRESSION: 1. No evidence of thoracoabdominal aortic aneurysm or dissection. 2. No pulmonary embolism. 3. Acute ascending diverticulitis. No evidence of perforation or abscess formation. Electronically Signed   By: Helyn Numbers M.D.   On: 04/24/2023 20:42   US Pelvis Complete  Result Date: 04/24/2023 CLINICAL DATA:  Left lower quadrant abdominal pain. Rule out torsion. EXAM: TRANSABDOMINAL AND TRANSVAGINAL ULTRASOUND OF PELVIS DOPPLER ULTRASOUND OF OVARIES TECHNIQUE: Both transabdominal and transvaginal ultrasound examinations of the pelvis were performed. Transabdominal technique was performed for global imaging of the pelvis including uterus, ovaries, adnexal regions, and pelvic cul-de-sac. It was necessary to proceed with endovaginal exam following the transabdominal exam to visualize the endometrium and ovaries. Color and duplex Doppler ultrasound was utilized to evaluate blood flow to the ovaries. COMPARISON:  CT abdomen pelvis dated 04/24/2023. FINDINGS: Uterus Measurements: 10.0 x 5.3 x 7.3 cm = volume: 200 mL. The uterus is heterogeneous with findings suggestive of adenomyosis. Endometrium Thickness: 14 mm.  No focal abnormality visualized. Right ovary Measurements: 2.5 x 2.2 x 1.9 cm = volume: 5.3 mL. Normal appearance/no adnexal mass. Left ovary Measurements: 4.1 x 2.2 x 2.5 cm = volume: 11.7 mL. A 2 cm somewhat thick walled cystic structure appears to be within the ovary and likely represents a corpus luteum. The possibility of an ectopic pregnancy is less likely as this appears to be within the ovary and not separate from the ovary. Correlation with pregnancy test recommended. Pulsed Doppler evaluation of both ovaries demonstrates normal low-resistance arterial and venous waveforms. Other findings No abnormal free fluid. IMPRESSION: 1. A 2 cm left ovarian corpus luteum. 2. Doppler detected flow to both ovaries. 3. Probable mild adenomyosis. Electronically Signed   By: Elgie Collard M.D.   On: 04/24/2023 20:19   US Transvaginal Non-OB  Result Date: 04/24/2023 CLINICAL DATA:  Left lower quadrant abdominal pain. Rule out torsion. EXAM: TRANSABDOMINAL AND TRANSVAGINAL ULTRASOUND OF PELVIS DOPPLER ULTRASOUND OF  OVARIES TECHNIQUE: Both transabdominal and transvaginal ultrasound examinations of the pelvis were performed. Transabdominal technique was performed for global imaging of the pelvis including uterus, ovaries, adnexal regions, and pelvic cul-de-sac. It was necessary to proceed with endovaginal exam following the transabdominal exam to visualize the endometrium and ovaries. Color and duplex Doppler ultrasound was utilized to evaluate blood flow to the ovaries. COMPARISON:  CT abdomen pelvis dated 04/24/2023. FINDINGS: Uterus Measurements: 10.0 x 5.3 x 7.3 cm = volume: 200 mL. The uterus is heterogeneous with findings suggestive of adenomyosis. Endometrium Thickness: 14 mm.  No focal abnormality visualized. Right ovary Measurements: 2.5 x 2.2 x 1.9 cm = volume: 5.3 mL. Normal appearance/no adnexal mass. Left ovary Measurements: 4.1 x 2.2 x 2.5 cm = volume: 11.7 mL. A 2 cm somewhat thick walled cystic structure appears to be within the ovary and likely represents a corpus luteum. The possibility of an ectopic pregnancy is less likely as this appears to be within the ovary and not separate from the ovary. Correlation with pregnancy test recommended. Pulsed Doppler evaluation of both ovaries demonstrates normal low-resistance arterial and venous waveforms. Other findings No abnormal free fluid. IMPRESSION: 1. A 2 cm left ovarian corpus luteum. 2. Doppler detected flow to both ovaries. 3. Probable mild adenomyosis. Electronically Signed   By: Elgie Collard M.D.   On: 04/24/2023 20:19   [place] Art/Ven Flow Abd Pelv Doppler  Result Date: 04/24/2023 CLINICAL DATA:  Left lower quadrant abdominal pain. Rule out torsion. EXAM: TRANSABDOMINAL AND TRANSVAGINAL ULTRASOUND OF PELVIS DOPPLER ULTRASOUND OF OVARIES TECHNIQUE: Both transabdominal and transvaginal ultrasound examinations of the pelvis were performed. Transabdominal technique was performed for global imaging of the pelvis including uterus, ovaries, adnexal regions, and  pelvic cul-de-sac. It was necessary to proceed with endovaginal exam following the transabdominal exam to visualize the endometrium and ovaries. Color and duplex Doppler ultrasound was utilized to evaluate blood flow to the ovaries. COMPARISON:  CT abdomen pelvis dated 04/24/2023. FINDINGS: Uterus Measurements: 10.0 x 5.3 x 7.3 cm = volume: 200 mL. The uterus is heterogeneous with findings suggestive of adenomyosis. Endometrium Thickness: 14 mm.  No focal abnormality visualized. Right ovary Measurements: 2.5 x 2.2 x 1.9 cm = volume: 5.3 mL. Normal appearance/no adnexal mass. Left ovary Measurements: 4.1 x 2.2 x 2.5 cm = volume: 11.7 mL. A 2 cm somewhat thick walled cystic structure appears to be within the ovary and likely represents a corpus luteum. The possibility of an ectopic pregnancy is less likely as this appears to be within the ovary and not separate from the ovary. Correlation with pregnancy test recommended. Pulsed Doppler evaluation of both ovaries demonstrates normal low-resistance arterial and venous waveforms. Other findings No abnormal free fluid. IMPRESSION: 1. A 2 cm left ovarian corpus luteum. 2. Doppler detected flow to both ovaries. 3. Probable mild adenomyosis. Electronically Signed   By: Elgie Collard M.D.   On: 04/24/2023 20:19   US Abdomen Limited RUQ (LIVER/GB)  Result Date: 04/24/2023 CLINICAL DATA:  Right upper quadrant pain EXAM: ULTRASOUND ABDOMEN LIMITED RIGHT UPPER QUADRANT COMPARISON:  CT renal stone 04/24/2023 FINDINGS: Gallbladder: No gallstones or wall thickening visualized. There is trace pericholecystic fluid. No sonographic Murphy sign noted by sonographer. Common bile duct: Diameter: 6.4 mm Liver: No focal lesion identified. Within normal limits in parenchymal echogenicity. Portal vein is patent on color Doppler imaging with normal direction of blood flow towards the liver. Other: None. IMPRESSION: 1. There is trace pericholecystic fluid. There is no sonographic  evidence of acute cholecystitis. 2. Common bile duct measures 6.4 mm which is at the upper limits of normal. Correlation with liver function tests is recommended. If there is clinical concern for biliary obstruction, MRCP can be obtained for further evaluation. Electronically Signed   By: Darliss Cheney M.D.   On: 04/24/2023 20:02   CT Renal Stone Study  Result Date: 04/24/2023 CLINICAL DATA:  Abdominal/flank pain, stone suspected. Right-sided abdominal pain for 2 hours. EXAM: CT ABDOMEN AND PELVIS WITHOUT CONTRAST TECHNIQUE: Multidetector CT imaging of the abdomen and pelvis was performed following the standard protocol without IV contrast. RADIATION DOSE REDUCTION: This exam was performed according to the departmental dose-optimization program which includes automated exposure control, adjustment of the mA and/or kV according to patient size and/or use of iterative reconstruction technique. COMPARISON:  CT examination dated July 04, 2017. FINDINGS: Lower chest: No acute abnormality. Hepatobiliary: No focal liver abnormality is seen. No gallstones, gallbladder wall thickening, or biliary dilatation. Pancreas: Unremarkable. No pancreatic ductal dilatation or surrounding inflammatory changes. Spleen: Normal in size without focal abnormality. Adrenals/Urinary Tract: Adrenal glands are unremarkable. Kidneys are normal, without renal calculi, focal lesion, or hydronephrosis. Bladder is unremarkable. Stomach/Bowel: Stomach is within normal limits. Appendix appears normal. No evidence of bowel wall thickening, distention, or inflammatory changes. Vascular/Lymphatic: No significant vascular findings are present. No enlarged abdominal or pelvic lymph nodes. Reproductive: Uterus and bilateral adnexa are unremarkable. Bilateral tubal ligation clips. Other: No abdominal wall hernia or abnormality. No abdominopelvic ascites. Musculoskeletal: No acute or significant osseous findings. IMPRESSION: 1. No CT evidence of acute  abdominal/pelvic process. 2. No evidence of nephrolithiasis or hydronephrosis. 3. Normal appendix. No evidence of colitis or diverticulitis. Electronically Signed   By: Larose Hires D.O.   On: 04/24/2023 18:46    Microbiology: Results for orders placed or performed during the hospital encounter of 04/24/23  Wet prep, genital     Status: None   Collection Time: 04/24/23  7:14 PM  Result Value Ref Range Status   Yeast Wet Prep HPF POC NONE SEEN NONE SEEN Final   Trich, Wet Prep NONE SEEN NONE SEEN Final   Clue Cells Wet Prep HPF POC NONE SEEN NONE SEEN Final   WBC, Wet Prep HPF POC <10 <10 Final   Sperm NONE SEEN  Final    Comment: Performed at [facility], [address] [place], [place] [address]    Labs: CBC: Recent Labs  Lab 04/24/23 1721 04/24/23 1727 04/25/23 0048 04/26/23 0522 04/27/23 0507 04/28/23 0751  WBC 10.6*  --  13.1* 7.9 7.0 4.8  NEUTROABS 8.1*  --   --  5.8 4.9 2.8  HGB 11.5* 11.6* 10.9* 9.7* 10.7* 11.3*  HCT 34.4* 34.0* 34.4* 30.0* 32.9* 34.1*  MCV 88.9  --  93.0 90.6 89.6 89.3  PLT 155  --  130* 114* 140* 161   Basic Metabolic Panel: Recent Labs  Lab 04/24/23 1721 04/24/23 1727 04/26/23 0522 04/27/23 0507 04/28/23 0751  NA 135 138 135 135 136  K 3.5 3.6 2.9* 3.7 3.4*  CL 107 105 104 104 104  CO2 20*  --  25 23 24   GLUCOSE 98 96 107* 92 95  BUN 11 9 6  <5* 8  CREATININE 0.79 0.70 0.81 0.78 0.81  CALCIUM 9.1  --  8.3* 8.7* 8.8*  MG  --   --  1.7  --   --    Liver Function Tests: Recent Labs  Lab 04/24/23 1721  AST 20  ALT 13  ALKPHOS 42  BILITOT 0.7  PROT 7.1  ALBUMIN 3.9   CBG: No results for input(s): "GLUCAP" in the last 168 hours.  Discharge time spent: greater than 30 minutes.  Signed: Briant Cedar, MD [facility] 04/28/2023

## 2024-02-06 ENCOUNTER — Encounter (HOSPITAL_COMMUNITY): Payer: Self-pay | Admitting: *Deleted

## 2024-02-06 ENCOUNTER — Ambulatory Visit (HOSPITAL_COMMUNITY)
Admission: EM | Admit: 2024-02-06 | Discharge: 2024-02-06 | Disposition: A | Discharge status: Home / Self Care | Attending: Emergency Medicine | Admitting: Emergency Medicine

## 2024-02-06 DIAGNOSIS — R109 Unspecified abdominal pain: Secondary | ICD-10-CM | POA: Insufficient documentation

## 2024-02-06 DIAGNOSIS — Z113 Encounter for screening for infections with a predominantly sexual mode of transmission: Secondary | ICD-10-CM | POA: Insufficient documentation

## 2024-02-06 DIAGNOSIS — K625 Hemorrhage of anus and rectum: Secondary | ICD-10-CM | POA: Diagnosis not present

## 2024-02-06 LAB — RPR: RPR Ser Ql: NONREACTIVE

## 2024-02-06 LAB — POCT URINALYSIS DIP (MANUAL ENTRY)
Blood, UA: NEGATIVE
Glucose, UA: NEGATIVE mg/dL
Nitrite, UA: NEGATIVE
Protein Ur, POC: 30 mg/dL — AB
Spec Grav, UA: >=1.03 — AB (ref 1.010–1.025)
Urobilinogen, UA: 1 U/dL
pH, UA: 5.5 (ref 5.0–8.0)

## 2024-02-06 LAB — HIV ANTIBODY (ROUTINE TESTING W REFLEX): HIV Screen 4th Generation wRfx: NONREACTIVE

## 2024-02-06 NOTE — Discharge Instructions (Signed)
 Your results will come back over the next few days and someone will call if results are positive and require treatment.  Return here as needed.  You can also follow-up with [facility] if symptoms persist.  I have also attached the mobile health clinic schedule that you can follow-up with as well.  As discussed your rectal bleeding is likely related to previous history of hemorrhoids.  If you develop worsening bleeding, bleeding with clots, or severe abdominal pain please seek immediate medical treatment in the ER for further evaluation.

## 2024-02-06 NOTE — ED Provider Notes (Signed)
 [facility]    CSN: [id number] Arrival date & time: 02/06/24  0806      History   Chief Complaint Chief Complaint  Patient presents with   Rectal Bleeding   Abdominal Pain   Flank Pain    HPI Jaime Mendoza is a 33 y.o. female.   Patient presents stating that she has bright red blood coming from her rectum intermittently with bowel movements.  Patient reports history of hemorrhoids and constipation.   Patient endorses intermittent lower abdominal pain and flank pain.  Denies nausea, vomiting, diarrhea, dysuria, fever, urinary frequency/urgency, and abnormal vaginal discharge. History of pyelonephritis.  Patient is also requesting STD testing.  Denies any known exposures or related symptoms.   Rectal Bleeding Associated symptoms: abdominal pain   Sore Throat Associated symptoms include abdominal pain.  Abdominal Pain Associated symptoms: hematochezia   Flank Pain Associated symptoms include abdominal pain.    Past Medical History:  Diagnosis Date   Anxiety    Asthma    Chlamydia    Depression    HSV-2 infection complicating pregnancy    Preterm labor    SVD (spontaneous vaginal delivery) 01/30/2012    Patient Active Problem List   Diagnosis Date Noted   Malnutrition of moderate degree 04/27/2023   Acute pyelonephritis 04/25/2023   Acute diverticulitis 04/24/2023   Alcohol dependence with unspecified alcohol-induced disorder (HCC) 02/18/2021   Family history of MI (myocardial infarction) 02/18/2021   Severe recurrent major depression without psychotic features (HCC) 10/30/2018   S/P cesarean section and BTS  09/24/2016   History of PCR DNA positive for HSV2 09/18/2016   History of premature delivery, currently pregnant 07/05/2016   Supervision of normal intrauterine pregnancy in multigravida in first trimester 03/08/2016   MDD (major depressive disorder), single episode 11/08/2014    Past Surgical History:  Procedure Laterality Date   CESAREAN  SECTION N/A 09/24/2016   Procedure: CESAREAN SECTION;  Surgeon: Tereso Newcomer, MD;  [facility]: [facility];  Service: Obstetrics;  Laterality: N/A;   NO PAST SURGERIES      OB History     Gravida  7   Para  5   Term  4   Preterm  1   AB  2   Living  5      SAB  2   IAB  0   Ectopic  0   Multiple  0   Live Births  5            Home Medications    Prior to Admission medications   Medication Sig Start Date End Date Taking? Authorizing Provider  OLANZapine (ZYPREXA) 5 MG tablet Take 1 tablet (5 mg total) by mouth at bedtime. For mood control Patient not taking: Reported on 02/06/2020 11/01/18 02/06/20  Money, Gerlene Burdock, FNP  sertraline (ZOLOFT) 50 MG tablet Take 1 tablet (50 mg total) by mouth daily. For mood control Patient not taking: Reported on 02/06/2020 11/02/18 02/06/20  Money, Gerlene Burdock, FNP  traZODone (DESYREL) 50 MG tablet Take 1 tablet (50 mg total) by mouth at bedtime as needed for sleep. Patient not taking: Reported on 02/06/2020 11/01/18 02/06/20  Money, Gerlene Burdock, FNP    Family History Family History  Problem Relation Age of Onset   Cancer Father        colon   Seizures Sister    Diabetes Maternal Grandmother    Heart disease Maternal Grandfather    Heart disease Paternal Grandfather  Anesthesia problems Neg Hx    Hypotension Neg Hx    Malignant hyperthermia Neg Hx    Pseudochol deficiency Neg Hx     Social History Social History   Tobacco Use   Smoking status: Every Day    Current packs/day: 0.25    Average packs/day: 0.3 packs/day for 5.0 years (1.3 ttl pk-yrs)    Types: Cigarettes   Smokeless tobacco: Never  Vaping Use   Vaping status: Never Used  Substance Use Topics   Alcohol use: Yes    Comment: socially   Drug use: Yes    Types: Cocaine, Marijuana    Comment: Positive for Cocaine, THC, Amphetaines ... pt states no cocaine X 3-4 months....02/06/2024     Allergies   Percocet [oxycodone-acetaminophen]   Review of  Systems Review of Systems  Gastrointestinal:  Positive for abdominal pain and hematochezia.  Genitourinary:  Positive for flank pain.   Per HPI  Physical Exam Triage Vital Signs ED Triage Vitals  Encounter Vitals Group     BP 02/06/24 0830 111/78     Systolic BP Percentile --      Diastolic BP Percentile --      Pulse Rate 02/06/24 0830 85     Resp 02/06/24 0830 18     Temp 02/06/24 0830 97.7 F (36.5 C)     Temp Source 02/06/24 0830 Oral     SpO2 02/06/24 0830 97 %     Weight --      Height --      Head Circumference --      Peak Flow --      Pain Score 02/06/24 0828 0     Pain Loc --      Pain Education --      Exclude from Growth Chart --    No data found.  Updated Vital Signs BP 111/78 (BP Location: Left Arm)   Pulse 85   Temp 97.7 F (36.5 C) (Oral)   Resp 18   LMP 01/24/2024 (Exact Date)   SpO2 97%   Visual Acuity Right Eye Distance:   Left Eye Distance:   Bilateral Distance:    Right Eye Near:   Left Eye Near:    Bilateral Near:     Physical Exam Vitals and nursing note reviewed.  Constitutional:      General: She is awake. She is not in acute distress.    Appearance: Normal appearance. She is well-developed and well-groomed. She is not ill-appearing.  Cardiovascular:     Rate and Rhythm: Normal rate and regular rhythm.  Pulmonary:     Effort: Pulmonary effort is normal.     Breath sounds: Normal breath sounds.  Abdominal:     General: Abdomen is flat. Bowel sounds are normal.     Palpations: Abdomen is soft.     Tenderness: There is no abdominal tenderness. There is no right CVA tenderness or left CVA tenderness.  Skin:    General: Skin is warm and dry.  Neurological:     Mental Status: She is alert.  Psychiatric:        Behavior: Behavior is cooperative.      UC Treatments / Results  Labs (all labs ordered are listed, but only abnormal results are displayed) Labs Reviewed  POCT URINALYSIS DIP (MANUAL ENTRY) - Abnormal; Notable for  the following components:      Result Value   Color, UA orange (*)    Clarity, UA cloudy (*)    Bilirubin, UA  small (*)    Ketones, POC UA small (15) (*)    Spec Grav, UA >=1.030 (*)    Protein Ur, POC =30 (*)    Leukocytes, UA Trace (*)    All other components within normal limits  URINE CULTURE  HIV ANTIBODY (ROUTINE TESTING W REFLEX)  RPR  CERVICOVAGINAL ANCILLARY ONLY    EKG   Radiology No results found.  Procedures Procedures (including critical care time)  Medications Ordered in UC Medications - No data to display  Initial Impression / Assessment and Plan / UC Course  I have reviewed the triage vital signs and the nursing notes.  Pertinent labs & imaging results that were available during my care of the patient were reviewed by me and considered in my medical decision making (see chart for details).     Discussed with patient that the blood coming from her rectum is most likely consistent with history of hemorrhoids and constipation.  Discussed follow-up and strict ER precautions related to this.  No significant findings upon exam.  Nontender upon palpation abdomen.  Without CVA tenderness.  UA revealed small bilirubin, ketones, protein, and trace leukocytes.  Will send culture to confirm no presence of UTI.  Patient is also requesting STD testing.  GU exam deferred due to lack of symptoms.  Patient performed self swab for STD/STI.  HIV and syphilis testing ordered.  Discussed follow-up and return precautions.  Final diagnoses:  Rectal bleeding  Bilateral flank pain  Screening for STD (sexually transmitted disease)     Discharge Instructions      Your results will come back over the next few days and someone will call if results are positive and require treatment.  Return here as needed.  You can also follow-up with [facility] if symptoms persist.  I have also attached the mobile health clinic schedule that you can follow-up  with as well.  As discussed your rectal bleeding is likely related to previous history of hemorrhoids.  If you develop worsening bleeding, bleeding with clots, or severe abdominal pain please seek immediate medical treatment in the ER for further evaluation.     ED Prescriptions   None    PDMP not reviewed this encounter.   Wynonia Lawman A, [place] 02/06/24 [phone]

## 2024-02-06 NOTE — ED Triage Notes (Addendum)
 Pt states she has had rectal bleeding since Friday. She states she has blood in the toilet when she urinates.  She states she is having lower abdominal pain and flank pain.   She states she has sore throat after eating a taco that scratched her throat.    She said since its a new year she would like STI testing as well.

## 2024-02-07 ENCOUNTER — Telehealth: Payer: Self-pay

## 2024-02-07 LAB — URINE CULTURE

## 2024-02-07 LAB — CERVICOVAGINAL ANCILLARY ONLY
Chlamydia: NEGATIVE
Comment: NEGATIVE
Comment: NEGATIVE
Comment: NORMAL
Neisseria Gonorrhea: NEGATIVE
Trichomonas: POSITIVE — AB

## 2024-02-07 MED ORDER — METRONIDAZOLE 500 MG PO TABS: 500.0000 mg | ORAL_TABLET | Freq: Two times a day (BID) | ORAL | 0 refills | Status: AC

## 2024-02-07 NOTE — Telephone Encounter (Signed)
 Per protocol, pt requires tx with metronidazole. Reviewed with patient, verified pharmacy, prescription sent.

## 2024-02-11 ENCOUNTER — Ambulatory Visit (INDEPENDENT_AMBULATORY_CARE_PROVIDER_SITE_OTHER)

## 2024-02-11 ENCOUNTER — Ambulatory Visit (HOSPITAL_COMMUNITY)
Admission: EM | Admit: 2024-02-11 | Discharge: 2024-02-11 | Disposition: A | Discharge status: Home / Self Care | Attending: Internal Medicine | Admitting: Internal Medicine

## 2024-02-11 ENCOUNTER — Encounter (HOSPITAL_COMMUNITY): Payer: Self-pay

## 2024-02-11 DIAGNOSIS — S161XXA Strain of muscle, fascia and tendon at neck level, initial encounter: Secondary | ICD-10-CM

## 2024-02-11 DIAGNOSIS — R52 Pain, unspecified: Secondary | ICD-10-CM

## 2024-02-11 DIAGNOSIS — R0782 Intercostal pain: Secondary | ICD-10-CM

## 2024-02-11 DIAGNOSIS — R0781 Pleurodynia: Secondary | ICD-10-CM | POA: Diagnosis not present

## 2024-02-11 MED ORDER — KETOROLAC TROMETHAMINE 30 MG/ML IJ SOLN
INTRAMUSCULAR | Status: AC
  Filled 2024-02-11: qty 1

## 2024-02-11 MED ORDER — BACLOFEN 10 MG PO TABS: 10.0000 mg | ORAL_TABLET | Freq: Three times a day (TID) | ORAL | 0 refills | Status: DC

## 2024-02-11 MED ORDER — IBUPROFEN 800 MG PO TABS: 800.0000 mg | ORAL_TABLET | Freq: Three times a day (TID) | ORAL | 0 refills | Status: DC

## 2024-02-11 MED ORDER — KETOROLAC TROMETHAMINE 30 MG/ML IJ SOLN
30.0000 mg | Freq: Once | INTRAMUSCULAR | Status: AC
  Administered 2024-02-11: 30 mg via INTRAMUSCULAR
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
## 2024-04-29 ENCOUNTER — Telehealth: Admitting: Physician Assistant

## 2024-04-29 DIAGNOSIS — N761 Subacute and chronic vaginitis: Secondary | ICD-10-CM

## 2024-04-29 NOTE — Progress Notes (Signed)
  Because of need for testing due to persistent symptoms, I feel your condition warrants further evaluation and I recommend that you be seen in a face-to-face visit.   NOTE: There will be NO CHARGE for this E-Visit   If you are having a true medical emergency, please call 911.     For an urgent face to face visit, [place] has multiple urgent care centers for your convenience.  Click the link below for the full list of locations and hours, walk-in wait times, appointment scheduling options and driving directions:  [place], [place], [place]  [place]     Your MyChart E-visit questionnaire answers were reviewed by a board certified advanced clinical practitioner to complete your personal care plan based on your specific symptoms.    Thank you for using e-Visits.

## 2024-04-30 ENCOUNTER — Encounter (HOSPITAL_COMMUNITY): Payer: Self-pay

## 2024-04-30 ENCOUNTER — Ambulatory Visit (HOSPITAL_COMMUNITY)
Admission: RE | Admit: 2024-04-30 | Discharge: 2024-04-30 | Disposition: A | Discharge status: Home / Self Care | Source: Ambulatory Visit | Attending: Emergency Medicine | Admitting: Emergency Medicine

## 2024-04-30 VITALS — BP 115/85 | HR 88 | Temp 97.7°F | Resp 16

## 2024-04-30 DIAGNOSIS — R350 Frequency of micturition: Secondary | ICD-10-CM | POA: Diagnosis present

## 2024-04-30 DIAGNOSIS — Z113 Encounter for screening for infections with a predominantly sexual mode of transmission: Secondary | ICD-10-CM | POA: Insufficient documentation

## 2024-04-30 DIAGNOSIS — Z3202 Encounter for pregnancy test, result negative: Secondary | ICD-10-CM | POA: Diagnosis not present

## 2024-04-30 DIAGNOSIS — N898 Other specified noninflammatory disorders of vagina: Secondary | ICD-10-CM | POA: Insufficient documentation

## 2024-04-30 LAB — POCT URINALYSIS DIP (MANUAL ENTRY)
Bilirubin, UA: NEGATIVE
Blood, UA: NEGATIVE
Glucose, UA: NEGATIVE mg/dL
Ketones, POC UA: NEGATIVE mg/dL
Leukocytes, UA: NEGATIVE
Nitrite, UA: NEGATIVE
Protein Ur, POC: NEGATIVE mg/dL
Spec Grav, UA: 1.015 (ref 1.010–1.025)
Urobilinogen, UA: 0.2 U/dL
pH, UA: 6 (ref 5.0–8.0)

## 2024-04-30 LAB — HIV ANTIBODY (ROUTINE TESTING W REFLEX): HIV Screen 4th Generation wRfx: NONREACTIVE

## 2024-04-30 LAB — RPR: RPR Ser Ql: NONREACTIVE

## 2024-04-30 LAB — POCT URINE PREGNANCY: Preg Test, Ur: NEGATIVE

## 2024-04-30 NOTE — Discharge Instructions (Signed)
 Your results will come back over the next few days and someone will call if results are positive and require treatment.  Return here as needed.

## 2024-04-30 NOTE — ED Triage Notes (Signed)
 Patient here today with urinary frequency since March. Patient states that in March she tested positive for Trich and still having vaginal discomfort. Patient states that she completed the medication prescribed which helped some but there were times she was not able to keep the medicine down and vomited it up due to the taste.

## 2024-04-30 NOTE — ED Provider Notes (Signed)
 [facility]    CSN: [id number] Arrival date & time: 04/30/24  1052      History   Chief Complaint Chief Complaint  Patient presents with   Urinary Frequency    Entered by patient   SEXUALLY TRANSMITTED DISEASE    HPI Jaime Mendoza is a 33 y.o. female.   Patient presents with intermittent urinary frequency since March.  Patient was seen here in March and tested positive for trichomonas at that time.  Patient states that she had difficulty completing the medication because she has difficulty swallowing this and usually vomits of the medication due to the taste.  Patient reports that she is continue to have some vaginal discomfort and abnormal vaginal discharge.  Patient is requesting retesting for STDs just to be sure that she is still positive for trichomonas or any other STDs prior to starting any additional treatment.  The history is provided by the patient and medical records.  Urinary Frequency    Past Medical History:  Diagnosis Date   Anxiety    Asthma    Chlamydia    Depression    HSV-2 infection complicating pregnancy    Preterm labor    SVD (spontaneous vaginal delivery) 01/30/2012    Patient Active Problem List   Diagnosis Date Noted   Malnutrition of moderate degree 04/27/2023   Acute pyelonephritis 04/25/2023   Acute diverticulitis 04/24/2023   Alcohol dependence with unspecified alcohol-induced disorder (HCC) 02/18/2021   Family history of MI (myocardial infarction) 02/18/2021   Severe recurrent major depression without psychotic features (HCC) 10/30/2018   S/P cesarean section and BTS  09/24/2016   History of PCR DNA positive for HSV2 09/18/2016   History of premature delivery, currently pregnant 07/05/2016   Supervision of normal intrauterine pregnancy in multigravida in first trimester 03/08/2016   MDD (major depressive disorder), single episode 11/08/2014    Past Surgical History:  Procedure Laterality Date   CESAREAN SECTION N/A  09/24/2016   Procedure: CESAREAN SECTION;  Surgeon: Julianne Octave, MD;  [facility];  Service: Obstetrics;  Laterality: N/A;   NO PAST SURGERIES      OB History     Gravida  7   Para  5   Term  4   Preterm  1   AB  2   Living  5      SAB  2   IAB  0   Ectopic  0   Multiple  0   Live Births  5            Home Medications    Prior to Admission medications   Medication Sig Start Date End Date Taking? Authorizing Provider  OLANZapine  (ZYPREXA ) 5 MG tablet Take 1 tablet (5 mg total) by mouth at bedtime. For mood control Patient not taking: Reported on 02/06/2020 11/01/18 02/06/20  Money, Christella Coventry, FNP  sertraline  (ZOLOFT ) 50 MG tablet Take 1 tablet (50 mg total) by mouth daily. For mood control Patient not taking: Reported on 02/06/2020 11/02/18 02/06/20  Money, Christella Coventry, FNP  traZODone  (DESYREL ) 50 MG tablet Take 1 tablet (50 mg total) by mouth at bedtime as needed for sleep. Patient not taking: Reported on 02/06/2020 11/01/18 02/06/20  Money, Christella Coventry, FNP    Family History Family History  Problem Relation Age of Onset   Cancer Father        colon   Seizures Sister    Diabetes Maternal Grandmother    Heart disease Maternal  Grandfather    Heart disease Paternal Grandfather    Anesthesia problems Neg Hx    Hypotension Neg Hx    Malignant hyperthermia Neg Hx    Pseudochol deficiency Neg Hx     Social History Social History   Tobacco Use   Smoking status: Every Day    Current packs/day: 0.25    Average packs/day: 0.3 packs/day for 5.0 years (1.3 ttl pk-yrs)    Types: Cigarettes   Smokeless tobacco: Never  Vaping Use   Vaping status: Never Used  Substance Use Topics   Alcohol use: Yes    Comment: a 5th daily   Drug use: Not Currently    Types: Cocaine, Marijuana    Comment: Positive for Cocaine, THC, Amphetaines ... pt states no cocaine X 3-4 months....02/06/2024     Allergies   Percocet [oxycodone -acetaminophen ]   Review of  Systems Review of Systems  Genitourinary:  Positive for frequency.   Per HPI  Physical Exam Triage Vital Signs ED Triage Vitals  Encounter Vitals Group     BP 04/30/24 1111 115/85     Systolic BP Percentile --      Diastolic BP Percentile --      Pulse Rate 04/30/24 1111 88     Resp 04/30/24 1111 16     Temp 04/30/24 1111 97.7 F (36.5 C)     Temp Source 04/30/24 1111 Oral     SpO2 04/30/24 1111 97 %     Weight --      Height --      Head Circumference --      Peak Flow --      Pain Score 04/30/24 1110 2     Pain Loc --      Pain Education --      Exclude from Growth Chart --    No data found.  Updated Vital Signs BP 115/85 (BP Location: Right Arm)   Pulse 88   Temp 97.7 F (36.5 C) (Oral)   Resp 16   LMP 04/24/2024 (Approximate)   SpO2 97%   Visual Acuity Right Eye Distance:   Left Eye Distance:   Bilateral Distance:    Right Eye Near:   Left Eye Near:    Bilateral Near:     Physical Exam Vitals and nursing note reviewed.  Constitutional:      General: She is awake. She is not in acute distress.    Appearance: Normal appearance. She is well-developed and well-groomed. She is not ill-appearing.  Genitourinary:    Comments: Exam deferred Neurological:     Mental Status: She is alert.  Psychiatric:        Behavior: Behavior is cooperative.      UC Treatments / Results  Labs (all labs ordered are listed, but only abnormal results are displayed) Labs Reviewed  POCT URINALYSIS DIP (MANUAL ENTRY) - Abnormal; Notable for the following components:      Result Value   Clarity, UA cloudy (*)    All other components within normal limits  URINE CULTURE  HIV ANTIBODY (ROUTINE TESTING W REFLEX)  RPR  POCT URINE PREGNANCY  CERVICOVAGINAL ANCILLARY ONLY    EKG   Radiology No results found.  Procedures Procedures (including critical care time)  Medications Ordered in UC Medications - No data to display  Initial Impression / Assessment and Plan  / UC Course  I have reviewed the triage vital signs and the nursing notes.  Pertinent labs & imaging results that were available during  my care of the patient were reviewed by me and considered in my medical decision making (see chart for details).     Patient is well-appearing.  Vitals are stable.  GU exam deferred.  Patient perform self swab for STD/STI.  HIV and RPR ordered.  Urinalysis unremarkable, will send urine culture to confirm no presence of urinary tract infection.  Deferred empirically treating at this time.  Patient is requesting an alternative treatment to metronidazole  if she needs this.  Discussed follow-up and return precautions. Final Clinical Impressions(s) / UC Diagnoses   Final diagnoses:  Urinary frequency  Vaginal discharge  Screening for STD (sexually transmitted disease)     Discharge Instructions      Your results will come back over the next few days and someone will call if results are positive and require treatment.  Return here as needed.   ED Prescriptions   None    PDMP not reviewed this encounter.   Levora Reas A, NP 04/30/24 1140

## 2024-05-01 ENCOUNTER — Ambulatory Visit (HOSPITAL_COMMUNITY): Payer: Self-pay

## 2024-05-01 LAB — CERVICOVAGINAL ANCILLARY ONLY
Bacterial Vaginitis (gardnerella): POSITIVE — AB
Candida Glabrata: NEGATIVE
Candida Vaginitis: POSITIVE — AB
Chlamydia: NEGATIVE
Comment: NEGATIVE
Comment: NEGATIVE
Comment: NEGATIVE
Comment: NEGATIVE
Comment: NEGATIVE
Comment: NORMAL
Neisseria Gonorrhea: NEGATIVE
Trichomonas: NEGATIVE

## 2024-05-01 LAB — URINE CULTURE: Culture: NO GROWTH

## 2024-05-01 MED ORDER — FLUCONAZOLE 150 MG PO TABS: 150.0000 mg | ORAL_TABLET | Freq: Once | ORAL | 0 refills | Status: AC

## 2024-05-01 MED ORDER — METRONIDAZOLE 0.75 % VA GEL: 1.0000 | Freq: Every day | VAGINAL | 0 refills | Status: AC

## 2024-08-07 ENCOUNTER — Other Ambulatory Visit: Payer: Self-pay

## 2024-08-07 ENCOUNTER — Emergency Department (HOSPITAL_COMMUNITY)
Admission: EM | Admit: 2024-08-07 | Discharge: 2024-08-07 | Discharge status: Against Medical Advice | Attending: Emergency Medicine | Admitting: Emergency Medicine

## 2024-08-07 ENCOUNTER — Encounter (HOSPITAL_COMMUNITY): Payer: Self-pay

## 2024-08-07 DIAGNOSIS — K0889 Other specified disorders of teeth and supporting structures: Secondary | ICD-10-CM | POA: Diagnosis present

## 2024-08-07 DIAGNOSIS — Z5321 Procedure and treatment not carried out due to patient leaving prior to being seen by health care provider: Secondary | ICD-10-CM | POA: Insufficient documentation

## 2024-08-07 DIAGNOSIS — K0381 Cracked tooth: Secondary | ICD-10-CM | POA: Diagnosis not present

## 2024-08-07 NOTE — ED Triage Notes (Signed)
 Pt here from home with c/o right upper dental pain , unable to find a dentist

## 2024-08-07 NOTE — ED Notes (Signed)
 Patient has wisdom tooth that broke off and has been hurting x 1 week.  Patient also has swollen right side of face and reports difficulty opening mouth to eat and hasn't ate in 2 days.

## 2024-08-07 NOTE — ED Notes (Signed)
 Called for room and unable to locate in lobby. Will try again later

## 2024-08-08 ENCOUNTER — Ambulatory Visit (HOSPITAL_COMMUNITY): Payer: Self-pay

## 2024-09-09 ENCOUNTER — Ambulatory Visit (HOSPITAL_COMMUNITY): Payer: Self-pay

## 2024-09-12 ENCOUNTER — Ambulatory Visit (HOSPITAL_COMMUNITY): Payer: Self-pay

## 2024-12-02 ENCOUNTER — Ambulatory Visit (HOSPITAL_COMMUNITY): Payer: Self-pay

## 2024-12-24 ENCOUNTER — Encounter (HOSPITAL_COMMUNITY): Payer: Self-pay

## 2024-12-24 ENCOUNTER — Ambulatory Visit (HOSPITAL_COMMUNITY)
Admission: RE | Admit: 2024-12-24 | Discharge: 2024-12-24 | Disposition: A | Discharge status: Home / Self Care | Payer: Self-pay | Source: Ambulatory Visit | Attending: Family Medicine | Admitting: Family Medicine

## 2024-12-24 VITALS — BP 106/79 | HR 90 | Temp 98.1°F | Resp 16

## 2024-12-24 DIAGNOSIS — R350 Frequency of micturition: Secondary | ICD-10-CM

## 2024-12-24 DIAGNOSIS — Z113 Encounter for screening for infections with a predominantly sexual mode of transmission: Secondary | ICD-10-CM

## 2024-12-24 LAB — POCT URINE DIPSTICK
Bilirubin, UA: NEGATIVE
Blood, UA: NEGATIVE
Glucose, UA: NEGATIVE mg/dL
Ketones, POC UA: NEGATIVE mg/dL
Leukocytes, UA: NEGATIVE
Nitrite, UA: NEGATIVE
POC PROTEIN,UA: NEGATIVE
Spec Grav, UA: <=1.005 — AB
Urobilinogen, UA: 0.2 U/dL
pH, UA: 6

## 2024-12-24 LAB — POCT URINE PREGNANCY: Preg Test, Ur: NEGATIVE

## 2024-12-24 NOTE — Discharge Instructions (Addendum)
 We have sent testing for sexually transmitted infections. We will notify you of any positive results once they are received. If required, we will prescribe any medications you might need.  Please refrain from all sexual activity for at least the next seven days.

## 2024-12-24 NOTE — ED Triage Notes (Signed)
 Pt reports yesterday after urinating noticed blood on tissue when wipes. Reports has urinary frequency and urge. Also having some right flank pains intermittently.

## 2024-12-25 LAB — CERVICOVAGINAL ANCILLARY ONLY
Chlamydia: NEGATIVE
Comment: NEGATIVE
Comment: NEGATIVE
Comment: NORMAL
Neisseria Gonorrhea: NEGATIVE
Trichomonas: POSITIVE — AB

## 2024-12-26 ENCOUNTER — Ambulatory Visit (HOSPITAL_COMMUNITY): Payer: Self-pay

## 2024-12-26 MED ORDER — METRONIDAZOLE 500 MG PO TABS: 500.0000 mg | ORAL_TABLET | Freq: Two times a day (BID) | ORAL | 0 refills | Status: AC
